# Patient Record
Sex: Female | Born: 1940
Health system: Southern US, Community
[De-identification: ages and names within clinical notes are randomized; demographics above are authoritative.]

## PROBLEM LIST (undated history)

## (undated) DIAGNOSIS — N189 Chronic kidney disease, unspecified: Secondary | ICD-10-CM

## (undated) DIAGNOSIS — R51 Headache: Secondary | ICD-10-CM

## (undated) DIAGNOSIS — T4145XA Adverse effect of unspecified anesthetic, initial encounter: Secondary | ICD-10-CM

## (undated) DIAGNOSIS — I10 Essential (primary) hypertension: Secondary | ICD-10-CM

## (undated) DIAGNOSIS — O24419 Gestational diabetes mellitus in pregnancy, unspecified control: Secondary | ICD-10-CM

## (undated) DIAGNOSIS — G4730 Sleep apnea, unspecified: Secondary | ICD-10-CM

## (undated) DIAGNOSIS — E785 Hyperlipidemia, unspecified: Secondary | ICD-10-CM

## (undated) DIAGNOSIS — F32A Depression, unspecified: Secondary | ICD-10-CM

## (undated) DIAGNOSIS — F419 Anxiety disorder, unspecified: Secondary | ICD-10-CM

## (undated) DIAGNOSIS — M1990 Unspecified osteoarthritis, unspecified site: Secondary | ICD-10-CM

## (undated) DIAGNOSIS — C189 Malignant neoplasm of colon, unspecified: Secondary | ICD-10-CM

## (undated) DIAGNOSIS — K759 Inflammatory liver disease, unspecified: Secondary | ICD-10-CM

## (undated) DIAGNOSIS — R519 Headache, unspecified: Secondary | ICD-10-CM

## (undated) DIAGNOSIS — T8859XA Other complications of anesthesia, initial encounter: Secondary | ICD-10-CM

## (undated) DIAGNOSIS — L509 Urticaria, unspecified: Secondary | ICD-10-CM

## (undated) DIAGNOSIS — E039 Hypothyroidism, unspecified: Secondary | ICD-10-CM

## (undated) DIAGNOSIS — K219 Gastro-esophageal reflux disease without esophagitis: Secondary | ICD-10-CM

## (undated) HISTORY — PX: CARPAL TUNNEL RELEASE: SHX101

## (undated) HISTORY — PX: ROTATOR CUFF REPAIR: SHX139

## (undated) HISTORY — DX: Hyperlipidemia, unspecified: E78.5

## (undated) HISTORY — DX: Urticaria, unspecified: L50.9

## (undated) HISTORY — PX: EYE SURGERY: SHX253

## (undated) HISTORY — PX: DILATION AND CURETTAGE OF UTERUS: SHX78

## (undated) HISTORY — PX: ABDOMINAL HYSTERECTOMY: SHX81

## (undated) HISTORY — DX: Sleep apnea, unspecified: G47.30

## (undated) HISTORY — PX: COLON SURGERY: SHX602

## (undated) HISTORY — PX: APPENDECTOMY: SHX54

---

## 1988-09-25 HISTORY — PX: ROTATOR CUFF REPAIR: SHX139

## 2000-03-13 ENCOUNTER — Encounter: Payer: Self-pay | Admitting: Internal Medicine

## 2000-03-13 ENCOUNTER — Encounter: Admission: RE | Admit: 2000-03-13 | Discharge: 2000-03-13 | Payer: Self-pay | Admitting: Internal Medicine

## 2000-04-25 ENCOUNTER — Encounter: Payer: Self-pay | Admitting: Internal Medicine

## 2000-04-25 ENCOUNTER — Encounter: Admission: RE | Admit: 2000-04-25 | Discharge: 2000-04-25 | Payer: Self-pay | Admitting: Internal Medicine

## 2001-03-15 ENCOUNTER — Encounter: Admission: RE | Admit: 2001-03-15 | Discharge: 2001-03-15 | Payer: Self-pay | Admitting: Internal Medicine

## 2001-03-15 ENCOUNTER — Encounter: Payer: Self-pay | Admitting: Internal Medicine

## 2002-03-18 ENCOUNTER — Encounter: Admission: RE | Admit: 2002-03-18 | Discharge: 2002-03-18 | Payer: Self-pay | Admitting: Internal Medicine

## 2002-03-18 ENCOUNTER — Encounter: Payer: Self-pay | Admitting: Internal Medicine

## 2003-03-20 ENCOUNTER — Encounter: Payer: Self-pay | Admitting: Internal Medicine

## 2003-03-20 ENCOUNTER — Encounter: Admission: RE | Admit: 2003-03-20 | Discharge: 2003-03-20 | Payer: Self-pay | Admitting: Internal Medicine

## 2003-12-31 ENCOUNTER — Encounter (INDEPENDENT_AMBULATORY_CARE_PROVIDER_SITE_OTHER): Payer: Self-pay | Admitting: Specialist

## 2003-12-31 ENCOUNTER — Ambulatory Visit (HOSPITAL_COMMUNITY): Admission: RE | Admit: 2003-12-31 | Discharge: 2003-12-31 | Payer: Self-pay | Admitting: Gastroenterology

## 2004-03-21 ENCOUNTER — Encounter: Admission: RE | Admit: 2004-03-21 | Discharge: 2004-03-21 | Payer: Self-pay | Admitting: Internal Medicine

## 2005-01-12 ENCOUNTER — Ambulatory Visit: Payer: Self-pay | Admitting: Internal Medicine

## 2005-01-12 ENCOUNTER — Ambulatory Visit (HOSPITAL_BASED_OUTPATIENT_CLINIC_OR_DEPARTMENT_OTHER): Admission: RE | Admit: 2005-01-12 | Discharge: 2005-01-12 | Payer: Self-pay | Admitting: Internal Medicine

## 2005-03-22 ENCOUNTER — Encounter: Admission: RE | Admit: 2005-03-22 | Discharge: 2005-03-22 | Payer: Self-pay | Admitting: Internal Medicine

## 2006-03-30 ENCOUNTER — Encounter: Admission: RE | Admit: 2006-03-30 | Discharge: 2006-03-30 | Payer: Self-pay | Admitting: Internal Medicine

## 2007-04-03 ENCOUNTER — Encounter: Admission: RE | Admit: 2007-04-03 | Discharge: 2007-04-03 | Payer: Self-pay | Admitting: Internal Medicine

## 2007-05-17 ENCOUNTER — Encounter: Admission: RE | Admit: 2007-05-17 | Discharge: 2007-05-17 | Payer: Self-pay | Admitting: General Surgery

## 2007-05-22 ENCOUNTER — Encounter (INDEPENDENT_AMBULATORY_CARE_PROVIDER_SITE_OTHER): Payer: Self-pay | Admitting: General Surgery

## 2007-05-22 ENCOUNTER — Inpatient Hospital Stay (HOSPITAL_COMMUNITY): Admission: RE | Admit: 2007-05-22 | Discharge: 2007-05-28 | Payer: Self-pay | Admitting: General Surgery

## 2007-05-29 ENCOUNTER — Ambulatory Visit: Payer: Self-pay | Admitting: Hematology & Oncology

## 2007-06-20 LAB — CBC WITH DIFFERENTIAL/PLATELET
BASO%: 0.6 % (ref 0.0–2.0)
Basophils Absolute: 0 10*3/uL (ref 0.0–0.1)
HCT: 33.1 % — ABNORMAL LOW (ref 34.8–46.6)
LYMPH%: 36.1 % (ref 14.0–48.0)
MCH: 29.6 pg (ref 26.0–34.0)
MCHC: 34.8 g/dL (ref 32.0–36.0)
MONO#: 0.6 10*3/uL (ref 0.1–0.9)
NEUT%: 51.7 % (ref 39.6–76.8)
Platelets: 215 10*3/uL (ref 145–400)
WBC: 6.4 10*3/uL (ref 3.9–10.0)

## 2007-06-20 LAB — COMPREHENSIVE METABOLIC PANEL
ALT: 17 U/L (ref 0–35)
BUN: 14 mg/dL (ref 6–23)
CO2: 28 mEq/L (ref 19–32)
Creatinine, Ser: 0.91 mg/dL (ref 0.40–1.20)
Total Bilirubin: 0.7 mg/dL (ref 0.3–1.2)

## 2007-06-20 LAB — CEA: CEA: 0.8 ng/mL (ref 0.0–5.0)

## 2007-06-29 ENCOUNTER — Encounter: Admission: RE | Admit: 2007-06-29 | Discharge: 2007-06-29 | Payer: Self-pay | Admitting: General Surgery

## 2007-07-18 ENCOUNTER — Ambulatory Visit (HOSPITAL_COMMUNITY): Admission: RE | Admit: 2007-07-18 | Discharge: 2007-07-18 | Payer: Self-pay | Admitting: Hematology & Oncology

## 2007-07-23 ENCOUNTER — Ambulatory Visit: Payer: Self-pay | Admitting: Hematology & Oncology

## 2007-07-25 LAB — CBC WITH DIFFERENTIAL/PLATELET
Basophils Absolute: 0 10*3/uL (ref 0.0–0.1)
Eosinophils Absolute: 0.3 10*3/uL (ref 0.0–0.5)
HCT: 32 % — ABNORMAL LOW (ref 34.8–46.6)
HGB: 11.4 g/dL — ABNORMAL LOW (ref 11.6–15.9)
MCH: 29.9 pg (ref 26.0–34.0)
MONO#: 0.4 10*3/uL (ref 0.1–0.9)
NEUT#: 4 10*3/uL (ref 1.5–6.5)
NEUT%: 59.5 % (ref 39.6–76.8)
RDW: 13.3 % (ref 11.3–14.5)
WBC: 6.6 10*3/uL (ref 3.9–10.0)
lymph#: 2 10*3/uL (ref 0.9–3.3)

## 2007-07-25 LAB — COMPREHENSIVE METABOLIC PANEL
Albumin: 4.4 g/dL (ref 3.5–5.2)
BUN: 16 mg/dL (ref 6–23)
CO2: 25 mEq/L (ref 19–32)
Calcium: 9.8 mg/dL (ref 8.4–10.5)
Chloride: 104 mEq/L (ref 96–112)
Creatinine, Ser: 0.87 mg/dL (ref 0.40–1.20)
Glucose, Bld: 144 mg/dL — ABNORMAL HIGH (ref 70–99)
Potassium: 3.9 mEq/L (ref 3.5–5.3)

## 2007-09-26 HISTORY — PX: BACK SURGERY: SHX140

## 2007-10-11 ENCOUNTER — Encounter: Admission: RE | Admit: 2007-10-11 | Discharge: 2007-10-11 | Payer: Self-pay | Admitting: Internal Medicine

## 2007-10-15 ENCOUNTER — Ambulatory Visit: Payer: Self-pay | Admitting: Hematology & Oncology

## 2007-10-17 LAB — CBC WITH DIFFERENTIAL/PLATELET
BASO%: 0.5 % (ref 0.0–2.0)
EOS%: 3.2 % (ref 0.0–7.0)
Eosinophils Absolute: 0.2 10*3/uL (ref 0.0–0.5)
LYMPH%: 39.7 % (ref 14.0–48.0)
MCH: 29.2 pg (ref 26.0–34.0)
MCHC: 34.8 g/dL (ref 32.0–36.0)
MCV: 84 fL (ref 81.0–101.0)
MONO%: 8.2 % (ref 0.0–13.0)
Platelets: 227 10*3/uL (ref 145–400)
RBC: 3.96 10*6/uL (ref 3.70–5.32)

## 2007-10-17 LAB — COMPREHENSIVE METABOLIC PANEL
AST: 8 U/L (ref 0–37)
Alkaline Phosphatase: 56 U/L (ref 39–117)
Glucose, Bld: 79 mg/dL (ref 70–99)
Sodium: 142 mEq/L (ref 135–145)
Total Bilirubin: 0.6 mg/dL (ref 0.3–1.2)
Total Protein: 7 g/dL (ref 6.0–8.3)

## 2007-10-21 ENCOUNTER — Ambulatory Visit (HOSPITAL_COMMUNITY): Admission: RE | Admit: 2007-10-21 | Discharge: 2007-10-21 | Payer: Self-pay | Admitting: Hematology & Oncology

## 2007-11-07 ENCOUNTER — Encounter (INDEPENDENT_AMBULATORY_CARE_PROVIDER_SITE_OTHER): Payer: Self-pay | Admitting: Neurological Surgery

## 2007-11-07 ENCOUNTER — Inpatient Hospital Stay (HOSPITAL_COMMUNITY): Admission: RE | Admit: 2007-11-07 | Discharge: 2007-11-11 | Payer: Self-pay | Admitting: Neurological Surgery

## 2007-12-03 ENCOUNTER — Encounter: Admission: RE | Admit: 2007-12-03 | Discharge: 2007-12-03 | Payer: Self-pay | Admitting: Neurological Surgery

## 2008-02-04 ENCOUNTER — Encounter: Admission: RE | Admit: 2008-02-04 | Discharge: 2008-02-04 | Payer: Self-pay | Admitting: Neurological Surgery

## 2008-02-12 ENCOUNTER — Ambulatory Visit: Payer: Self-pay | Admitting: Hematology & Oncology

## 2008-02-14 ENCOUNTER — Ambulatory Visit (HOSPITAL_COMMUNITY): Admission: RE | Admit: 2008-02-14 | Discharge: 2008-02-14 | Payer: Self-pay | Admitting: Hematology & Oncology

## 2008-03-09 ENCOUNTER — Encounter: Admission: RE | Admit: 2008-03-09 | Discharge: 2008-03-09 | Payer: Self-pay | Admitting: Neurological Surgery

## 2008-03-31 ENCOUNTER — Encounter: Admission: RE | Admit: 2008-03-31 | Discharge: 2008-03-31 | Payer: Self-pay | Admitting: Neurological Surgery

## 2008-04-24 ENCOUNTER — Encounter: Admission: RE | Admit: 2008-04-24 | Discharge: 2008-04-24 | Payer: Self-pay | Admitting: Internal Medicine

## 2008-05-29 ENCOUNTER — Ambulatory Visit: Payer: Self-pay | Admitting: Hematology & Oncology

## 2008-06-10 ENCOUNTER — Ambulatory Visit: Payer: Self-pay | Admitting: Hematology & Oncology

## 2008-06-10 ENCOUNTER — Ambulatory Visit (HOSPITAL_COMMUNITY): Admission: RE | Admit: 2008-06-10 | Discharge: 2008-06-10 | Payer: Self-pay | Admitting: Hematology & Oncology

## 2008-06-10 LAB — CBC WITH DIFFERENTIAL/PLATELET
Basophils Absolute: 0 10*3/uL (ref 0.0–0.1)
EOS%: 4.1 % (ref 0.0–7.0)
HCT: 32.4 % — ABNORMAL LOW (ref 34.8–46.6)
HGB: 11 g/dL — ABNORMAL LOW (ref 11.6–15.9)
MCH: 28.6 pg (ref 26.0–34.0)
MCV: 83.9 fL (ref 81.0–101.0)
MONO%: 9.6 % (ref 0.0–13.0)
NEUT%: 55.7 % (ref 39.6–76.8)

## 2008-06-10 LAB — COMPREHENSIVE METABOLIC PANEL
AST: 30 U/L (ref 0–37)
Alkaline Phosphatase: 66 U/L (ref 39–117)
BUN: 11 mg/dL (ref 6–23)
Calcium: 9.4 mg/dL (ref 8.4–10.5)
Creatinine, Ser: 0.89 mg/dL (ref 0.40–1.20)

## 2008-06-29 ENCOUNTER — Encounter: Admission: RE | Admit: 2008-06-29 | Discharge: 2008-06-29 | Payer: Self-pay | Admitting: Neurological Surgery

## 2008-10-12 ENCOUNTER — Ambulatory Visit: Payer: Self-pay | Admitting: Hematology & Oncology

## 2008-10-13 ENCOUNTER — Encounter (HOSPITAL_COMMUNITY): Admission: RE | Admit: 2008-10-13 | Discharge: 2009-01-11 | Payer: Self-pay | Admitting: Hematology & Oncology

## 2008-10-16 ENCOUNTER — Ambulatory Visit (HOSPITAL_COMMUNITY): Admission: RE | Admit: 2008-10-16 | Discharge: 2008-10-16 | Payer: Self-pay | Admitting: Hematology & Oncology

## 2008-11-12 ENCOUNTER — Inpatient Hospital Stay (HOSPITAL_COMMUNITY): Admission: RE | Admit: 2008-11-12 | Discharge: 2008-11-18 | Payer: Self-pay | Admitting: General Surgery

## 2008-11-12 ENCOUNTER — Encounter (INDEPENDENT_AMBULATORY_CARE_PROVIDER_SITE_OTHER): Payer: Self-pay | Admitting: General Surgery

## 2008-11-25 ENCOUNTER — Inpatient Hospital Stay (HOSPITAL_COMMUNITY): Admission: EM | Admit: 2008-11-25 | Discharge: 2008-11-29 | Payer: Self-pay | Admitting: Emergency Medicine

## 2008-12-16 ENCOUNTER — Ambulatory Visit: Payer: Self-pay | Admitting: Hematology & Oncology

## 2008-12-17 LAB — CBC WITH DIFFERENTIAL (CANCER CENTER ONLY)
BASO#: 0 10*3/uL (ref 0.0–0.2)
BASO%: 0.4 % (ref 0.0–2.0)
EOS%: 3.2 % (ref 0.0–7.0)
HGB: 11.6 g/dL (ref 11.6–15.9)
MCH: 28.8 pg (ref 26.0–34.0)
MCHC: 33.5 g/dL (ref 32.0–36.0)
MONO%: 8.5 % (ref 0.0–13.0)
NEUT#: 2.4 10*3/uL (ref 1.5–6.5)
NEUT%: 54 % (ref 39.6–80.0)
RDW: 16.5 % — ABNORMAL HIGH (ref 10.5–14.6)

## 2008-12-19 LAB — COMPREHENSIVE METABOLIC PANEL
ALT: 27 U/L (ref 0–35)
CO2: 24 mEq/L (ref 19–32)
Calcium: 9.3 mg/dL (ref 8.4–10.5)
Chloride: 104 mEq/L (ref 96–112)
Creatinine, Ser: 0.98 mg/dL (ref 0.40–1.20)
Glucose, Bld: 111 mg/dL — ABNORMAL HIGH (ref 70–99)

## 2008-12-19 LAB — RETICULOCYTES (CHCC): ABS Retic: 48.7 10*3/uL (ref 19.0–186.0)

## 2009-01-04 ENCOUNTER — Ambulatory Visit (HOSPITAL_COMMUNITY): Admission: RE | Admit: 2009-01-04 | Discharge: 2009-01-04 | Payer: Self-pay | Admitting: General Surgery

## 2009-01-20 LAB — CBC WITH DIFFERENTIAL (CANCER CENTER ONLY)
BASO#: 0 10*3/uL (ref 0.0–0.2)
EOS%: 5.2 % (ref 0.0–7.0)
HCT: 33.9 % — ABNORMAL LOW (ref 34.8–46.6)
HGB: 11.6 g/dL (ref 11.6–15.9)
LYMPH#: 1.4 10*3/uL (ref 0.9–3.3)
LYMPH%: 40.4 % (ref 14.0–48.0)
MCH: 30.1 pg (ref 26.0–34.0)
MCHC: 34.1 g/dL (ref 32.0–36.0)
MCV: 88 fL (ref 81–101)
NEUT%: 44 % (ref 39.6–80.0)

## 2009-01-20 LAB — COMPREHENSIVE METABOLIC PANEL
ALT: 25 U/L (ref 0–35)
AST: 19 U/L (ref 0–37)
Albumin: 4.1 g/dL (ref 3.5–5.2)
Alkaline Phosphatase: 52 U/L (ref 39–117)
Potassium: 3.8 mEq/L (ref 3.5–5.3)
Sodium: 142 mEq/L (ref 135–145)
Total Protein: 6 g/dL (ref 6.0–8.3)

## 2009-01-20 LAB — CEA: CEA: 1.2 ng/mL (ref 0.0–5.0)

## 2009-02-02 ENCOUNTER — Ambulatory Visit: Payer: Self-pay | Admitting: Hematology & Oncology

## 2009-02-03 LAB — CBC WITH DIFFERENTIAL (CANCER CENTER ONLY)
BASO#: 0 10*3/uL (ref 0.0–0.2)
BASO%: 0.4 % (ref 0.0–2.0)
Eosinophils Absolute: 0.2 10*3/uL (ref 0.0–0.5)
HCT: 34.4 % — ABNORMAL LOW (ref 34.8–46.6)
HGB: 11.8 g/dL (ref 11.6–15.9)
LYMPH#: 1.4 10*3/uL (ref 0.9–3.3)
LYMPH%: 44.1 % (ref 14.0–48.0)
MCV: 89 fL (ref 81–101)
MONO#: 0.3 10*3/uL (ref 0.1–0.9)
NEUT%: 41.1 % (ref 39.6–80.0)
RBC: 3.87 10*6/uL (ref 3.70–5.32)
WBC: 3.2 10*3/uL — ABNORMAL LOW (ref 3.9–10.0)

## 2009-02-03 LAB — COMPREHENSIVE METABOLIC PANEL
AST: 21 U/L (ref 0–37)
Albumin: 4.3 g/dL (ref 3.5–5.2)
Alkaline Phosphatase: 55 U/L (ref 39–117)
BUN: 15 mg/dL (ref 6–23)
Calcium: 9.7 mg/dL (ref 8.4–10.5)
Chloride: 106 mEq/L (ref 96–112)
Glucose, Bld: 166 mg/dL — ABNORMAL HIGH (ref 70–99)
Potassium: 3.6 mEq/L (ref 3.5–5.3)
Sodium: 140 mEq/L (ref 135–145)
Total Protein: 6.4 g/dL (ref 6.0–8.3)

## 2009-02-17 LAB — CBC WITH DIFFERENTIAL (CANCER CENTER ONLY)
BASO#: 0 10*3/uL (ref 0.0–0.2)
BASO%: 0.8 % (ref 0.0–2.0)
EOS%: 5.7 % (ref 0.0–7.0)
Eosinophils Absolute: 0.2 10*3/uL (ref 0.0–0.5)
HCT: 31.8 % — ABNORMAL LOW (ref 34.8–46.6)
HGB: 11.1 g/dL — ABNORMAL LOW (ref 11.6–15.9)
LYMPH#: 1.4 10*3/uL (ref 0.9–3.3)
LYMPH%: 39.5 % (ref 14.0–48.0)
MCH: 31.5 pg (ref 26.0–34.0)
MCHC: 35 g/dL (ref 32.0–36.0)
MCV: 90 fL (ref 81–101)
MONO#: 0.4 10*3/uL (ref 0.1–0.9)
MONO%: 10.6 % (ref 0.0–13.0)
NEUT#: 1.5 10*3/uL (ref 1.5–6.5)
NEUT%: 43.4 % (ref 39.6–80.0)
Platelets: 89 10*3/uL — ABNORMAL LOW (ref 145–400)
RBC: 3.54 10*6/uL — ABNORMAL LOW (ref 3.70–5.32)
RDW: 13.9 % (ref 10.5–14.6)
WBC: 3.4 10*3/uL — ABNORMAL LOW (ref 3.9–10.0)

## 2009-02-17 LAB — COMPREHENSIVE METABOLIC PANEL
AST: 31 U/L (ref 0–37)
Alkaline Phosphatase: 56 U/L (ref 39–117)
BUN: 17 mg/dL (ref 6–23)
Glucose, Bld: 133 mg/dL — ABNORMAL HIGH (ref 70–99)
Potassium: 3.2 mEq/L — ABNORMAL LOW (ref 3.5–5.3)
Total Bilirubin: 0.8 mg/dL (ref 0.3–1.2)

## 2009-02-17 LAB — CEA: CEA: 1.1 ng/mL (ref 0.0–5.0)

## 2009-03-03 LAB — COMPREHENSIVE METABOLIC PANEL
ALT: 29 U/L (ref 0–35)
AST: 25 U/L (ref 0–37)
BUN: 13 mg/dL (ref 6–23)
Calcium: 9.7 mg/dL (ref 8.4–10.5)
Chloride: 108 mEq/L (ref 96–112)
Creatinine, Ser: 0.92 mg/dL (ref 0.40–1.20)
Total Bilirubin: 0.7 mg/dL (ref 0.3–1.2)

## 2009-03-03 LAB — CEA: CEA: 1.1 ng/mL (ref 0.0–5.0)

## 2009-03-03 LAB — CBC WITH DIFFERENTIAL (CANCER CENTER ONLY)
EOS%: 6.5 % (ref 0.0–7.0)
LYMPH%: 44.6 % (ref 14.0–48.0)
MCH: 30.9 pg (ref 26.0–34.0)
MCHC: 33.6 g/dL (ref 32.0–36.0)
MCV: 92 fL (ref 81–101)
MONO%: 8.9 % (ref 0.0–13.0)
NEUT#: 1.2 10*3/uL — ABNORMAL LOW (ref 1.5–6.5)
Platelets: 85 10*3/uL — ABNORMAL LOW (ref 145–400)
RDW: 14.9 % — ABNORMAL HIGH (ref 10.5–14.6)

## 2009-03-17 LAB — CBC WITH DIFFERENTIAL (CANCER CENTER ONLY)
BASO%: 1.1 % (ref 0.0–2.0)
Eosinophils Absolute: 0.2 10*3/uL (ref 0.0–0.5)
LYMPH%: 42.5 % (ref 14.0–48.0)
MCV: 93 fL (ref 81–101)
MONO#: 0.6 10*3/uL (ref 0.1–0.9)
MONO%: 13.9 % — ABNORMAL HIGH (ref 0.0–13.0)
NEUT#: 1.5 10*3/uL (ref 1.5–6.5)
Platelets: 96 10*3/uL — ABNORMAL LOW (ref 145–400)
RBC: 3.75 10*6/uL (ref 3.70–5.32)
RDW: 15.3 % — ABNORMAL HIGH (ref 10.5–14.6)
WBC: 4 10*3/uL (ref 3.9–10.0)

## 2009-03-23 ENCOUNTER — Ambulatory Visit: Payer: Self-pay | Admitting: Hematology & Oncology

## 2009-03-31 LAB — CBC WITH DIFFERENTIAL (CANCER CENTER ONLY)
BASO#: 0 10*3/uL (ref 0.0–0.2)
BASO%: 0.3 % (ref 0.0–2.0)
EOS%: 4.6 % (ref 0.0–7.0)
HGB: 11.1 g/dL — ABNORMAL LOW (ref 11.6–15.9)
MCH: 32 pg (ref 26.0–34.0)
MCHC: 34 g/dL (ref 32.0–36.0)
MONO%: 11 % (ref 0.0–13.0)
NEUT#: 1.2 10*3/uL — ABNORMAL LOW (ref 1.5–6.5)
Platelets: 84 10*3/uL — ABNORMAL LOW (ref 145–400)
RDW: 15 % — ABNORMAL HIGH (ref 10.5–14.6)

## 2009-03-31 LAB — COMPREHENSIVE METABOLIC PANEL
ALT: 34 U/L (ref 0–35)
AST: 27 U/L (ref 0–37)
Alkaline Phosphatase: 57 U/L (ref 39–117)
BUN: 11 mg/dL (ref 6–23)
Calcium: 9.5 mg/dL (ref 8.4–10.5)
Chloride: 106 mEq/L (ref 96–112)
Creatinine, Ser: 0.94 mg/dL (ref 0.40–1.20)
Total Bilirubin: 0.5 mg/dL (ref 0.3–1.2)

## 2009-04-14 LAB — CBC WITH DIFFERENTIAL (CANCER CENTER ONLY)
EOS%: 5.6 % (ref 0.0–7.0)
Eosinophils Absolute: 0.2 10*3/uL (ref 0.0–0.5)
LYMPH#: 1.4 10*3/uL (ref 0.9–3.3)
MCH: 32.2 pg (ref 26.0–34.0)
MONO%: 9.6 % (ref 0.0–13.0)
NEUT#: 1.6 10*3/uL (ref 1.5–6.5)
Platelets: 86 10*3/uL — ABNORMAL LOW (ref 145–400)
RBC: 3.62 10*6/uL — ABNORMAL LOW (ref 3.70–5.32)

## 2009-04-14 LAB — COMPREHENSIVE METABOLIC PANEL
ALT: 39 U/L — ABNORMAL HIGH (ref 0–35)
CO2: 20 mEq/L (ref 19–32)
Calcium: 9.7 mg/dL (ref 8.4–10.5)
Chloride: 105 mEq/L (ref 96–112)
Creatinine, Ser: 0.97 mg/dL (ref 0.40–1.20)
Glucose, Bld: 142 mg/dL — ABNORMAL HIGH (ref 70–99)
Total Bilirubin: 0.6 mg/dL (ref 0.3–1.2)

## 2009-04-14 LAB — CEA: CEA: 1.3 ng/mL (ref 0.0–5.0)

## 2009-04-20 ENCOUNTER — Ambulatory Visit (HOSPITAL_COMMUNITY): Admission: RE | Admit: 2009-04-20 | Discharge: 2009-04-20 | Payer: Self-pay | Admitting: Hematology & Oncology

## 2009-04-27 ENCOUNTER — Ambulatory Visit: Payer: Self-pay | Admitting: Hematology & Oncology

## 2009-04-28 LAB — COMPREHENSIVE METABOLIC PANEL
Albumin: 4.1 g/dL (ref 3.5–5.2)
Alkaline Phosphatase: 56 U/L (ref 39–117)
BUN: 12 mg/dL (ref 6–23)
CO2: 24 mEq/L (ref 19–32)
Calcium: 9.5 mg/dL (ref 8.4–10.5)
Glucose, Bld: 138 mg/dL — ABNORMAL HIGH (ref 70–99)
Potassium: 3.4 mEq/L — ABNORMAL LOW (ref 3.5–5.3)
Sodium: 142 mEq/L (ref 135–145)
Total Protein: 6.4 g/dL (ref 6.0–8.3)

## 2009-04-28 LAB — CBC WITH DIFFERENTIAL (CANCER CENTER ONLY)
BASO#: 0 10*3/uL (ref 0.0–0.2)
BASO%: 0.5 % (ref 0.0–2.0)
EOS%: 5.9 % (ref 0.0–7.0)
HCT: 35.5 % (ref 34.8–46.6)
HGB: 12 g/dL (ref 11.6–15.9)
LYMPH%: 36 % (ref 14.0–48.0)
MCH: 32 pg (ref 26.0–34.0)
MCHC: 33.8 g/dL (ref 32.0–36.0)
MCV: 95 fL (ref 81–101)
MONO%: 8.5 % (ref 0.0–13.0)
NEUT%: 49.1 % (ref 39.6–80.0)
RDW: 13.8 % (ref 10.5–14.6)

## 2009-05-12 LAB — COMPREHENSIVE METABOLIC PANEL
ALT: 29 U/L (ref 0–35)
AST: 23 U/L (ref 0–37)
Albumin: 4.1 g/dL (ref 3.5–5.2)
Alkaline Phosphatase: 61 U/L (ref 39–117)
BUN: 9 mg/dL (ref 6–23)
Calcium: 9.4 mg/dL (ref 8.4–10.5)
Chloride: 107 mEq/L (ref 96–112)
Potassium: 3.2 mEq/L — ABNORMAL LOW (ref 3.5–5.3)
Sodium: 142 mEq/L (ref 135–145)
Total Protein: 6.2 g/dL (ref 6.0–8.3)

## 2009-05-12 LAB — CBC WITH DIFFERENTIAL (CANCER CENTER ONLY)
BASO#: 0.1 10*3/uL (ref 0.0–0.2)
HCT: 33.3 % — ABNORMAL LOW (ref 34.8–46.6)
HGB: 11.6 g/dL (ref 11.6–15.9)
LYMPH#: 1.4 10*3/uL (ref 0.9–3.3)
LYMPH%: 38.3 % (ref 14.0–48.0)
MCV: 95 fL (ref 81–101)
MONO#: 0.3 10*3/uL (ref 0.1–0.9)
NEUT%: 44.6 % (ref 39.6–80.0)
RDW: 13.5 % (ref 10.5–14.6)
WBC: 3.6 10*3/uL — ABNORMAL LOW (ref 3.9–10.0)

## 2009-05-26 LAB — COMPREHENSIVE METABOLIC PANEL
ALT: 33 U/L (ref 0–35)
AST: 28 U/L (ref 0–37)
Albumin: 4 g/dL (ref 3.5–5.2)
Alkaline Phosphatase: 55 U/L (ref 39–117)
Glucose, Bld: 161 mg/dL — ABNORMAL HIGH (ref 70–99)
Potassium: 3.4 mEq/L — ABNORMAL LOW (ref 3.5–5.3)
Sodium: 141 mEq/L (ref 135–145)
Total Bilirubin: 0.7 mg/dL (ref 0.3–1.2)
Total Protein: 6.2 g/dL (ref 6.0–8.3)

## 2009-05-26 LAB — CBC WITH DIFFERENTIAL (CANCER CENTER ONLY)
BASO%: 1.3 % (ref 0.0–2.0)
EOS%: 5.7 % (ref 0.0–7.0)
HCT: 35.3 % (ref 34.8–46.6)
LYMPH%: 35.4 % (ref 14.0–48.0)
MCHC: 34.6 g/dL (ref 32.0–36.0)
MCV: 95 fL (ref 81–101)
NEUT%: 47.6 % (ref 39.6–80.0)
RDW: 13.2 % (ref 10.5–14.6)

## 2009-05-27 ENCOUNTER — Ambulatory Visit: Payer: Self-pay | Admitting: Hematology & Oncology

## 2009-06-04 ENCOUNTER — Encounter: Admission: RE | Admit: 2009-06-04 | Discharge: 2009-06-04 | Payer: Self-pay | Admitting: Internal Medicine

## 2009-06-09 LAB — COMPREHENSIVE METABOLIC PANEL
Alkaline Phosphatase: 61 U/L (ref 39–117)
BUN: 8 mg/dL (ref 6–23)
CO2: 25 mEq/L (ref 19–32)
Creatinine, Ser: 0.83 mg/dL (ref 0.40–1.20)
Glucose, Bld: 134 mg/dL — ABNORMAL HIGH (ref 70–99)
Total Bilirubin: 0.6 mg/dL (ref 0.3–1.2)

## 2009-06-09 LAB — CBC WITH DIFFERENTIAL (CANCER CENTER ONLY)
BASO%: 0.4 % (ref 0.0–2.0)
Eosinophils Absolute: 0.2 10*3/uL (ref 0.0–0.5)
HCT: 33.2 % — ABNORMAL LOW (ref 34.8–46.6)
LYMPH#: 1.6 10*3/uL (ref 0.9–3.3)
LYMPH%: 48.7 % — ABNORMAL HIGH (ref 14.0–48.0)
MCV: 95 fL (ref 81–101)
MONO#: 0.3 10*3/uL (ref 0.1–0.9)
NEUT%: 35.4 % — ABNORMAL LOW (ref 39.6–80.0)
Platelets: 86 10*3/uL — ABNORMAL LOW (ref 145–400)
RBC: 3.49 10*6/uL — ABNORMAL LOW (ref 3.70–5.32)
RDW: 13.3 % (ref 10.5–14.6)
WBC: 3.2 10*3/uL — ABNORMAL LOW (ref 3.9–10.0)

## 2009-06-09 LAB — CEA: CEA: 1.3 ng/mL (ref 0.0–5.0)

## 2009-07-12 ENCOUNTER — Ambulatory Visit: Payer: Self-pay | Admitting: Diagnostic Radiology

## 2009-07-12 ENCOUNTER — Ambulatory Visit (HOSPITAL_BASED_OUTPATIENT_CLINIC_OR_DEPARTMENT_OTHER): Admission: RE | Admit: 2009-07-12 | Discharge: 2009-07-12 | Payer: Self-pay | Admitting: Hematology & Oncology

## 2009-07-13 ENCOUNTER — Ambulatory Visit: Payer: Self-pay | Admitting: Hematology & Oncology

## 2009-07-15 LAB — CBC WITH DIFFERENTIAL (CANCER CENTER ONLY)
BASO%: 0.5 % (ref 0.0–2.0)
Eosinophils Absolute: 0.4 10*3/uL (ref 0.0–0.5)
HCT: 34.6 % — ABNORMAL LOW (ref 34.8–46.6)
LYMPH%: 31.1 % (ref 14.0–48.0)
MCV: 94 fL (ref 81–101)
MONO#: 0.4 10*3/uL (ref 0.1–0.9)
NEUT%: 53.1 % (ref 39.6–80.0)
RDW: 12.7 % (ref 10.5–14.6)
WBC: 5 10*3/uL (ref 3.9–10.0)

## 2009-07-15 LAB — COMPREHENSIVE METABOLIC PANEL
Albumin: 4.2 g/dL (ref 3.5–5.2)
Alkaline Phosphatase: 59 U/L (ref 39–117)
BUN: 13 mg/dL (ref 6–23)
CO2: 25 mEq/L (ref 19–32)
Calcium: 9.4 mg/dL (ref 8.4–10.5)
Glucose, Bld: 117 mg/dL — ABNORMAL HIGH (ref 70–99)
Potassium: 3.5 mEq/L (ref 3.5–5.3)

## 2009-07-16 LAB — VITAMIN D 25 HYDROXY (VIT D DEFICIENCY, FRACTURES): Vit D, 25-Hydroxy: 27 ng/mL — ABNORMAL LOW (ref 30–89)

## 2009-08-25 ENCOUNTER — Ambulatory Visit: Payer: Self-pay | Admitting: Hematology & Oncology

## 2009-10-07 ENCOUNTER — Ambulatory Visit: Payer: Self-pay | Admitting: Diagnostic Radiology

## 2009-10-07 ENCOUNTER — Ambulatory Visit (HOSPITAL_BASED_OUTPATIENT_CLINIC_OR_DEPARTMENT_OTHER): Admission: RE | Admit: 2009-10-07 | Discharge: 2009-10-07 | Payer: Self-pay | Admitting: Hematology & Oncology

## 2009-10-12 ENCOUNTER — Ambulatory Visit: Payer: Self-pay | Admitting: Hematology & Oncology

## 2009-10-14 LAB — COMPREHENSIVE METABOLIC PANEL
BUN: 14 mg/dL (ref 6–23)
CO2: 25 mEq/L (ref 19–32)
Calcium: 9.7 mg/dL (ref 8.4–10.5)
Chloride: 106 mEq/L (ref 96–112)
Creatinine, Ser: 0.84 mg/dL (ref 0.40–1.20)
Glucose, Bld: 107 mg/dL — ABNORMAL HIGH (ref 70–99)

## 2009-10-14 LAB — CBC WITH DIFFERENTIAL (CANCER CENTER ONLY)
Eosinophils Absolute: 0.3 10*3/uL (ref 0.0–0.5)
HCT: 37.3 % (ref 34.8–46.6)
LYMPH%: 39.8 % (ref 14.0–48.0)
MCV: 89 fL (ref 81–101)
MONO#: 0.4 10*3/uL (ref 0.1–0.9)
NEUT%: 48 % (ref 39.6–80.0)
RBC: 4.18 10*6/uL (ref 3.70–5.32)
WBC: 5.5 10*3/uL (ref 3.9–10.0)

## 2009-10-14 LAB — FERRITIN: Ferritin: 250 ng/mL (ref 10–291)

## 2009-11-24 ENCOUNTER — Ambulatory Visit: Payer: Self-pay | Admitting: Hematology & Oncology

## 2010-01-11 ENCOUNTER — Ambulatory Visit: Payer: Self-pay | Admitting: Hematology & Oncology

## 2010-01-12 LAB — COMPREHENSIVE METABOLIC PANEL
Albumin: 4.3 g/dL (ref 3.5–5.2)
Alkaline Phosphatase: 55 U/L (ref 39–117)
BUN: 15 mg/dL (ref 6–23)
Calcium: 9.9 mg/dL (ref 8.4–10.5)
Creatinine, Ser: 0.91 mg/dL (ref 0.40–1.20)
Glucose, Bld: 97 mg/dL (ref 70–99)
Potassium: 3.3 mEq/L — ABNORMAL LOW (ref 3.5–5.3)

## 2010-01-12 LAB — CBC WITH DIFFERENTIAL (CANCER CENTER ONLY)
BASO#: 0 10*3/uL (ref 0.0–0.2)
Eosinophils Absolute: 0.3 10*3/uL (ref 0.0–0.5)
HCT: 38 % (ref 34.8–46.6)
HGB: 12.8 g/dL (ref 11.6–15.9)
LYMPH%: 40.9 % (ref 14.0–48.0)
MCH: 30.5 pg (ref 26.0–34.0)
MCHC: 33.6 g/dL (ref 32.0–36.0)
MCV: 91 fL (ref 81–101)
MONO%: 7.8 % (ref 0.0–13.0)
NEUT#: 2.2 10*3/uL (ref 1.5–6.5)
NEUT%: 44.2 % (ref 39.6–80.0)
RBC: 4.2 10*6/uL (ref 3.70–5.32)

## 2010-01-12 LAB — VITAMIN D 25 HYDROXY (VIT D DEFICIENCY, FRACTURES): Vit D, 25-Hydroxy: 38 ng/mL (ref 30–89)

## 2010-01-28 ENCOUNTER — Ambulatory Visit (HOSPITAL_BASED_OUTPATIENT_CLINIC_OR_DEPARTMENT_OTHER): Admission: RE | Admit: 2010-01-28 | Discharge: 2010-01-28 | Payer: Self-pay | Admitting: General Surgery

## 2010-03-01 ENCOUNTER — Emergency Department (HOSPITAL_COMMUNITY): Admission: EM | Admit: 2010-03-01 | Discharge: 2010-03-02 | Payer: Self-pay | Admitting: Emergency Medicine

## 2010-04-11 ENCOUNTER — Ambulatory Visit: Payer: Self-pay | Admitting: Hematology & Oncology

## 2010-04-13 LAB — CBC WITH DIFFERENTIAL (CANCER CENTER ONLY)
BASO#: 0 10*3/uL (ref 0.0–0.2)
Eosinophils Absolute: 0.3 10*3/uL (ref 0.0–0.5)
HCT: 38.8 % (ref 34.8–46.6)
HGB: 13.3 g/dL (ref 11.6–15.9)
LYMPH#: 2.1 10*3/uL (ref 0.9–3.3)
LYMPH%: 38.1 % (ref 14.0–48.0)
MCV: 91 fL (ref 81–101)
MONO#: 0.5 10*3/uL (ref 0.1–0.9)
NEUT%: 48.2 % (ref 39.6–80.0)
WBC: 5.5 10*3/uL (ref 3.9–10.0)

## 2010-06-06 ENCOUNTER — Encounter: Admission: RE | Admit: 2010-06-06 | Discharge: 2010-06-06 | Payer: Self-pay | Admitting: Hematology & Oncology

## 2010-07-13 ENCOUNTER — Ambulatory Visit: Payer: Self-pay | Admitting: Hematology & Oncology

## 2010-07-14 LAB — CBC WITH DIFFERENTIAL (CANCER CENTER ONLY)
BASO#: 0 10*3/uL (ref 0.0–0.2)
EOS%: 5.2 % (ref 0.0–7.0)
Eosinophils Absolute: 0.3 10*3/uL (ref 0.0–0.5)
HGB: 12.8 g/dL (ref 11.6–15.9)
LYMPH#: 2.2 10*3/uL (ref 0.9–3.3)
MCHC: 33.8 g/dL (ref 32.0–36.0)
NEUT#: 2.5 10*3/uL (ref 1.5–6.5)
RBC: 4.15 10*6/uL (ref 3.70–5.32)

## 2010-07-14 LAB — COMPREHENSIVE METABOLIC PANEL
Albumin: 4.5 g/dL (ref 3.5–5.2)
Alkaline Phosphatase: 52 U/L (ref 39–117)
Glucose, Bld: 97 mg/dL (ref 70–99)
Potassium: 3.8 mEq/L (ref 3.5–5.3)
Sodium: 141 mEq/L (ref 135–145)
Total Protein: 6.6 g/dL (ref 6.0–8.3)

## 2010-10-16 ENCOUNTER — Encounter: Payer: Self-pay | Admitting: Hematology & Oncology

## 2010-10-17 ENCOUNTER — Encounter: Payer: Self-pay | Admitting: Hematology & Oncology

## 2010-10-18 ENCOUNTER — Ambulatory Visit: Payer: Self-pay | Admitting: Hematology & Oncology

## 2010-10-19 LAB — CBC WITH DIFFERENTIAL (CANCER CENTER ONLY)
BASO#: 0 10*3/uL (ref 0.0–0.2)
BASO%: 0.4 % (ref 0.0–2.0)
EOS%: 4.6 % (ref 0.0–7.0)
HGB: 13 g/dL (ref 11.6–15.9)
MCH: 31 pg (ref 26.0–34.0)
MCHC: 34.8 g/dL (ref 32.0–36.0)
MONO%: 8.1 % (ref 0.0–13.0)
NEUT#: 2.9 10*3/uL (ref 1.5–6.5)
Platelets: 169 10*3/uL (ref 145–400)
RDW: 11.8 % (ref 10.5–14.6)

## 2010-10-20 LAB — COMPREHENSIVE METABOLIC PANEL
ALT: 53 U/L — ABNORMAL HIGH (ref 0–35)
AST: 41 U/L — ABNORMAL HIGH (ref 0–37)
Albumin: 4.6 g/dL (ref 3.5–5.2)
Alkaline Phosphatase: 52 U/L (ref 39–117)
BUN: 18 mg/dL (ref 6–23)
Potassium: 3.4 mEq/L — ABNORMAL LOW (ref 3.5–5.3)

## 2010-12-12 LAB — URINALYSIS, ROUTINE W REFLEX MICROSCOPIC
Ketones, ur: NEGATIVE mg/dL
Nitrite: NEGATIVE
Protein, ur: NEGATIVE mg/dL
pH: 6 (ref 5.0–8.0)

## 2010-12-12 LAB — DIFFERENTIAL
Basophils Relative: 0 % (ref 0–1)
Eosinophils Absolute: 0.3 10*3/uL (ref 0.0–0.7)
Lymphs Abs: 3.4 10*3/uL (ref 0.7–4.0)
Monocytes Relative: 7 % (ref 3–12)
Neutro Abs: 4.9 10*3/uL (ref 1.7–7.7)
Neutrophils Relative %: 53 % (ref 43–77)

## 2010-12-12 LAB — COMPREHENSIVE METABOLIC PANEL
Alkaline Phosphatase: 57 U/L (ref 39–117)
BUN: 13 mg/dL (ref 6–23)
CO2: 26 mEq/L (ref 19–32)
Calcium: 10 mg/dL (ref 8.4–10.5)
GFR calc non Af Amer: >60 mL/min (ref >60)
Glucose, Bld: 119 mg/dL — ABNORMAL HIGH (ref 70–99)
Total Protein: 7.1 g/dL (ref 6.0–8.3)

## 2010-12-12 LAB — CBC
Platelets: 185 10*3/uL (ref 150–400)
RDW: 13 % (ref 11.5–15.5)

## 2010-12-12 LAB — LIPASE, BLOOD: Lipase: 36 U/L (ref 11–59)

## 2011-01-04 LAB — HEMOGLOBIN AND HEMATOCRIT, BLOOD
HCT: 36.5 % (ref 36.0–46.0)
Hemoglobin: 12.6 g/dL (ref 12.0–15.0)

## 2011-01-04 LAB — BASIC METABOLIC PANEL
CO2: 28 mEq/L (ref 19–32)
Calcium: 9.6 mg/dL (ref 8.4–10.5)
Creatinine, Ser: 0.77 mg/dL (ref 0.4–1.2)
GFR calc Af Amer: >60 mL/min (ref >60)
GFR calc non Af Amer: >60 mL/min (ref >60)
Sodium: 141 mEq/L (ref 135–145)

## 2011-01-05 LAB — CBC
HCT: 31.5 % — ABNORMAL LOW (ref 36.0–46.0)
HCT: 37.7 % (ref 36.0–46.0)
Hemoglobin: 10.7 g/dL — ABNORMAL LOW (ref 12.0–15.0)
Hemoglobin: 10.8 g/dL — ABNORMAL LOW (ref 12.0–15.0)
MCHC: 32.9 g/dL (ref 30.0–36.0)
MCHC: 33.4 g/dL (ref 30.0–36.0)
MCHC: 33.8 g/dL (ref 30.0–36.0)
MCV: 84.8 fL (ref 78.0–100.0)
MCV: 84.9 fL (ref 78.0–100.0)
Platelets: 372 10*3/uL (ref 150–400)
Platelets: 447 10*3/uL — ABNORMAL HIGH (ref 150–400)
RBC: 3.72 MIL/uL — ABNORMAL LOW (ref 3.87–5.11)
RBC: 3.75 MIL/uL — ABNORMAL LOW (ref 3.87–5.11)
RBC: 3.79 MIL/uL — ABNORMAL LOW (ref 3.87–5.11)
RDW: 20.9 % — ABNORMAL HIGH (ref 11.5–15.5)
RDW: 21.6 % — ABNORMAL HIGH (ref 11.5–15.5)
WBC: 11.4 10*3/uL — ABNORMAL HIGH (ref 4.0–10.5)
WBC: 5.1 10*3/uL (ref 4.0–10.5)

## 2011-01-05 LAB — DIFFERENTIAL
Basophils Absolute: 0 10*3/uL (ref 0.0–0.1)
Basophils Relative: 0 % (ref 0–1)
Eosinophils Absolute: 0.2 10*3/uL (ref 0.0–0.7)
Eosinophils Relative: 1 % (ref 0–5)
Lymphs Abs: 1.5 10*3/uL (ref 0.7–4.0)
Monocytes Absolute: 0.7 10*3/uL (ref 0.1–1.0)
Monocytes Relative: 6 % (ref 3–12)
Monocytes Relative: 9 % (ref 3–12)
Neutro Abs: 3 10*3/uL (ref 1.7–7.7)
Neutro Abs: 9.6 10*3/uL — ABNORMAL HIGH (ref 1.7–7.7)
Neutrophils Relative %: 59 % (ref 43–77)

## 2011-01-05 LAB — POCT I-STAT, CHEM 8
BUN: 17 mg/dL (ref 6–23)
Chloride: 96 mEq/L (ref 96–112)
Glucose, Bld: 126 mg/dL — ABNORMAL HIGH (ref 70–99)
HCT: 38 % (ref 36.0–46.0)
Potassium: 3.4 mEq/L — ABNORMAL LOW (ref 3.5–5.1)

## 2011-01-05 LAB — URINE MICROSCOPIC-ADD ON

## 2011-01-05 LAB — URINALYSIS, ROUTINE W REFLEX MICROSCOPIC
Bilirubin Urine: NEGATIVE
Glucose, UA: NEGATIVE mg/dL
Nitrite: NEGATIVE
Specific Gravity, Urine: 1.021 (ref 1.005–1.030)
pH: 7 (ref 5.0–8.0)

## 2011-01-05 LAB — BASIC METABOLIC PANEL
BUN: 9 mg/dL (ref 6–23)
CO2: 27 mEq/L (ref 19–32)
CO2: 30 mEq/L (ref 19–32)
Calcium: 8.8 mg/dL (ref 8.4–10.5)
Chloride: 102 mEq/L (ref 96–112)
Creatinine, Ser: 0.97 mg/dL (ref 0.4–1.2)
GFR calc Af Amer: >60 mL/min (ref >60)
Glucose, Bld: 104 mg/dL — ABNORMAL HIGH (ref 70–99)
Sodium: 135 mEq/L (ref 135–145)
Sodium: 137 mEq/L (ref 135–145)

## 2011-01-05 LAB — COMPREHENSIVE METABOLIC PANEL
ALT: 29 U/L (ref 0–35)
AST: 20 U/L (ref 0–37)
Albumin: 3.5 g/dL (ref 3.5–5.2)
Alkaline Phosphatase: 72 U/L (ref 39–117)
BUN: 15 mg/dL (ref 6–23)
Chloride: 95 mEq/L — ABNORMAL LOW (ref 96–112)
GFR calc Af Amer: >60 mL/min (ref >60)
Potassium: 3.4 mEq/L — ABNORMAL LOW (ref 3.5–5.1)
Sodium: 135 mEq/L (ref 135–145)
Total Bilirubin: 1 mg/dL (ref 0.3–1.2)
Total Protein: 6.7 g/dL (ref 6.0–8.3)

## 2011-01-05 LAB — PROTIME-INR: INR: 1 (ref 0.00–1.49)

## 2011-01-09 LAB — GLUCOSE, CAPILLARY: Glucose-Capillary: 109 mg/dL — ABNORMAL HIGH (ref 70–99)

## 2011-01-09 LAB — CROSSMATCH: Antibody Screen: NEGATIVE

## 2011-01-09 LAB — ABO/RH: ABO/RH(D): O POS

## 2011-01-10 LAB — COMPREHENSIVE METABOLIC PANEL
BUN: 11 mg/dL (ref 6–23)
CO2: 29 mEq/L (ref 19–32)
Calcium: 9.6 mg/dL (ref 8.4–10.5)
Chloride: 104 mEq/L (ref 96–112)
Creatinine, Ser: 0.88 mg/dL (ref 0.4–1.2)
GFR calc non Af Amer: >60 mL/min (ref >60)
Total Bilirubin: 0.6 mg/dL (ref 0.3–1.2)

## 2011-01-10 LAB — DIFFERENTIAL
Basophils Absolute: 0.1 10*3/uL (ref 0.0–0.1)
Eosinophils Relative: 5 % (ref 0–5)
Lymphocytes Relative: 28 % (ref 12–46)
Monocytes Relative: 7 % (ref 3–12)

## 2011-01-10 LAB — CBC
HCT: 23.4 % — ABNORMAL LOW (ref 36.0–46.0)
HCT: 30.9 % — ABNORMAL LOW (ref 36.0–46.0)
Hemoglobin: 10.2 g/dL — ABNORMAL LOW (ref 12.0–15.0)
Hemoglobin: 7.8 g/dL — CL (ref 12.0–15.0)
MCHC: 33.1 g/dL (ref 30.0–36.0)
MCHC: 33.9 g/dL (ref 30.0–36.0)
MCV: 82.4 fL (ref 78.0–100.0)
MCV: 85.1 fL (ref 78.0–100.0)
RBC: 2.76 MIL/uL — ABNORMAL LOW (ref 3.87–5.11)
RBC: 3.09 MIL/uL — ABNORMAL LOW (ref 3.87–5.11)
RBC: 3.63 MIL/uL — ABNORMAL LOW (ref 3.87–5.11)
RBC: 3.97 MIL/uL (ref 3.87–5.11)
RDW: 22.8 % — ABNORMAL HIGH (ref 11.5–15.5)
RDW: 26.1 % — ABNORMAL HIGH (ref 11.5–15.5)
WBC: 11.7 10*3/uL — ABNORMAL HIGH (ref 4.0–10.5)
WBC: 5.4 10*3/uL (ref 4.0–10.5)

## 2011-01-10 LAB — BASIC METABOLIC PANEL
CO2: 21 mEq/L (ref 19–32)
CO2: 27 mEq/L (ref 19–32)
Calcium: 8.2 mg/dL — ABNORMAL LOW (ref 8.4–10.5)
Chloride: 107 mEq/L (ref 96–112)
Chloride: 109 mEq/L (ref 96–112)
Creatinine, Ser: 0.99 mg/dL (ref 0.4–1.2)
GFR calc Af Amer: >60 mL/min (ref >60)
GFR calc Af Amer: >60 mL/min (ref >60)
Glucose, Bld: 141 mg/dL — ABNORMAL HIGH (ref 70–99)
Glucose, Bld: 174 mg/dL — ABNORMAL HIGH (ref 70–99)
Sodium: 137 mEq/L (ref 135–145)

## 2011-01-10 LAB — PROTIME-INR
INR: 1 (ref 0.00–1.49)
Prothrombin Time: 12.9 seconds (ref 11.6–15.2)

## 2011-01-10 LAB — URINALYSIS, ROUTINE W REFLEX MICROSCOPIC
Bilirubin Urine: NEGATIVE
Glucose, UA: NEGATIVE mg/dL
Hgb urine dipstick: NEGATIVE
Ketones, ur: NEGATIVE mg/dL
Protein, ur: NEGATIVE mg/dL

## 2011-01-10 LAB — TYPE AND SCREEN

## 2011-01-10 LAB — APTT: aPTT: 28 seconds (ref 24–37)

## 2011-01-18 ENCOUNTER — Other Ambulatory Visit: Payer: Self-pay | Admitting: Family

## 2011-01-18 ENCOUNTER — Encounter (HOSPITAL_BASED_OUTPATIENT_CLINIC_OR_DEPARTMENT_OTHER): Payer: Medicare Other | Admitting: Hematology & Oncology

## 2011-01-18 ENCOUNTER — Other Ambulatory Visit: Payer: Self-pay | Admitting: Hematology & Oncology

## 2011-01-18 ENCOUNTER — Ambulatory Visit (HOSPITAL_BASED_OUTPATIENT_CLINIC_OR_DEPARTMENT_OTHER)
Admission: RE | Admit: 2011-01-18 | Discharge: 2011-01-18 | Disposition: A | Discharge status: Home / Self Care | Payer: Medicare Other | Source: Ambulatory Visit | Attending: Hematology & Oncology | Admitting: Hematology & Oncology

## 2011-01-18 DIAGNOSIS — C186 Malignant neoplasm of descending colon: Secondary | ICD-10-CM

## 2011-01-18 DIAGNOSIS — R52 Pain, unspecified: Secondary | ICD-10-CM

## 2011-01-18 DIAGNOSIS — C189 Malignant neoplasm of colon, unspecified: Secondary | ICD-10-CM

## 2011-01-18 DIAGNOSIS — M79609 Pain in unspecified limb: Secondary | ICD-10-CM | POA: Insufficient documentation

## 2011-01-18 DIAGNOSIS — R7989 Other specified abnormal findings of blood chemistry: Secondary | ICD-10-CM

## 2011-01-18 DIAGNOSIS — M19019 Primary osteoarthritis, unspecified shoulder: Secondary | ICD-10-CM | POA: Insufficient documentation

## 2011-01-18 LAB — CBC WITH DIFFERENTIAL (CANCER CENTER ONLY)
BASO#: 0 10*3/uL (ref 0.0–0.2)
BASO%: 0.3 % (ref 0.0–2.0)
EOS%: 4.3 % (ref 0.0–7.0)
Eosinophils Absolute: 0.3 10*3/uL (ref 0.0–0.5)
HCT: 36.4 % (ref 34.8–46.6)
HGB: 12.6 g/dL (ref 11.6–15.9)
LYMPH#: 2.4 10*3/uL (ref 0.9–3.3)
LYMPH%: 34 % (ref 14.0–48.0)
MCH: 30.4 pg (ref 26.0–34.0)
MCHC: 34.6 g/dL (ref 32.0–36.0)
MCV: 88 fL (ref 81–101)
MONO#: 0.7 10*3/uL (ref 0.1–0.9)
MONO%: 10.1 % (ref 0.0–13.0)
NEUT#: 3.6 10*3/uL (ref 1.5–6.5)
NEUT%: 51.3 % (ref 39.6–80.0)
Platelets: 167 10*3/uL (ref 145–400)
RBC: 4.15 10*6/uL (ref 3.70–5.32)
RDW: 12.7 % (ref 11.1–15.7)
WBC: 7 10*3/uL (ref 3.9–10.0)

## 2011-01-18 LAB — COMPREHENSIVE METABOLIC PANEL WITH GFR
ALT: 61 U/L — ABNORMAL HIGH (ref 0–35)
AST: 62 U/L — ABNORMAL HIGH (ref 0–37)
Albumin: 4.6 g/dL (ref 3.5–5.2)
Alkaline Phosphatase: 50 U/L (ref 39–117)
BUN: 18 mg/dL (ref 6–23)
CO2: 27 meq/L (ref 19–32)
Calcium: 9.7 mg/dL (ref 8.4–10.5)
Chloride: 101 meq/L (ref 96–112)
Creatinine, Ser: 1.05 mg/dL (ref 0.40–1.20)
Glucose, Bld: 105 mg/dL — ABNORMAL HIGH (ref 70–99)
Potassium: 3.5 meq/L (ref 3.5–5.3)
Sodium: 139 meq/L (ref 135–145)
Total Bilirubin: 0.7 mg/dL (ref 0.3–1.2)
Total Protein: 7.1 g/dL (ref 6.0–8.3)

## 2011-01-18 LAB — CEA: CEA: 1.7 ng/mL (ref 0.0–5.0)

## 2011-01-26 ENCOUNTER — Encounter (HOSPITAL_COMMUNITY): Payer: Self-pay

## 2011-01-26 ENCOUNTER — Ambulatory Visit (HOSPITAL_COMMUNITY)
Admission: RE | Admit: 2011-01-26 | Discharge: 2011-01-26 | Disposition: A | Discharge status: Home / Self Care | Payer: Medicare Other | Source: Ambulatory Visit | Attending: Hematology & Oncology | Admitting: Hematology & Oncology

## 2011-01-26 DIAGNOSIS — C189 Malignant neoplasm of colon, unspecified: Secondary | ICD-10-CM | POA: Insufficient documentation

## 2011-01-26 DIAGNOSIS — C186 Malignant neoplasm of descending colon: Secondary | ICD-10-CM

## 2011-01-26 DIAGNOSIS — Z90710 Acquired absence of both cervix and uterus: Secondary | ICD-10-CM | POA: Insufficient documentation

## 2011-01-26 DIAGNOSIS — J984 Other disorders of lung: Secondary | ICD-10-CM | POA: Insufficient documentation

## 2011-01-26 DIAGNOSIS — R7989 Other specified abnormal findings of blood chemistry: Secondary | ICD-10-CM | POA: Insufficient documentation

## 2011-01-26 HISTORY — DX: Essential (primary) hypertension: I10

## 2011-01-26 HISTORY — DX: Malignant neoplasm of colon, unspecified: C18.9

## 2011-01-26 MED ORDER — IOHEXOL 300 MG/ML  SOLN
100.0000 mL | Freq: Once | INTRAMUSCULAR | Status: AC | PRN
  Administered 2011-01-26: 100 mL via INTRAVENOUS

## 2011-02-07 NOTE — Op Note (Signed)
Robin [name], Robin [name]                ACCOUNT NO.:  [id number]   MEDICAL RECORD NO.:  [id number]          PATIENT TYPE:  AMB   LOCATION:  DAY                          FACILITY:  [facility]   PHYSICIAN:  Angelia Mould. Derrell Lolling, M.D.DATE OF BIRTH:  Nov 26, 1940   DATE OF PROCEDURE:  01/04/2009  DATE OF DISCHARGE:                               OPERATIVE REPORT   PREOPERATIVE DIAGNOSIS:  Recurrent colon cancer.   POSTOPERATIVE DIAGNOSIS:  Recurrent colon cancer.   OPERATION PERFORMED:  Insertion of X-Port venous vascular access device  with fluoroscopic guidance.   SURGEON:  Dr. Claud Kelp.   OPERATIVE INDICATIONS:  This is a 70 year old Caucasian female who was  diagnosed with carcinoma of the descending colon, stage T3, N0 and  underwent surgery with a left colectomy on May 22, 2007.  She had a  4.2-cm tumor.  19 lymph nodes were examined and all 19 were negative.  She had widely negative margins.  She presented in February of this year  with left-sided pain and anemia and a colonoscopy showed a recurrent  colon cancer in the descending colon at or near the anastomosis.  She  underwent resection of the left colon on November 12, 2008 and this was  found to be recurrent moderately differentiated adenocarcinoma with a  6.6 tumor in greatest dimension.  All margins were negative.  Nodes were  negative.  Final pathology was T3, N0.  Because of the recurrent nature  of her tumor.  She is going to received chemotherapy at this time with  Dr. Myna Hidalgo.  Dr. Myna Hidalgo has requested insertion of Port-A-Cath.  She  has been counseled regarding the indications and details and risks of  this procedure and is in full agreement with this plan.  She is brought  to the operating room electively.  Chemotherapy is planned 48 hours from  now.   OPERATIVE TECHNIQUE:  The patient was brought to the operating room.  She was placed supine on the operating table.  Her arms were placed at  her sides and a very tiny  roll was placed between her shoulders.  She  was monitored and sedated by anesthesia department.  Intravenous  antibiotics were given.  The neck and chest was prepped and draped in  sterile fashion.  The patient was identified as to correct patient and  correct procedure and surgical time-out was held.  0.5% Marcaine with  epinephrine was used as local infiltration anesthetic.   A right subclavian venipuncture was performed with a single pass and a  guidewire inserted into the superior vena cava under fluoroscopic  guidance without difficulty.  A small incision was made at the wire  insertion site.  About 3 cm below this in the lateral anterior chest  wall I made about 3 cm incision and created a subcutaneous pocket.  Using a tunneling device I drew the catheter between the wire insertion  site and the port pocket site.  Using fluoroscopy I marked on the skin  where I thought the tip of the catheter should be in the superior vena  cava just at and above  the right atrial junction.  I then measured the  catheter using a marking the skin and cut the catheter a total of 21 cm  in length.  The catheter was then secured to the port with a locking  device.  This was flushed with heparinized saline.  The port was sutured  to the pectoralis fascia with 3 interrupted sutures of 2-0 Prolene.  I  then flushed the port one more time.  With the patient back in  Trendelenburg position, I inserted the dilator and peel-away sheath over  the wire into the central venous circulation.  The dilator and wire were  removed.  The catheter was threaded through the peel-away sheath into  the central venous circulation and peel-away sheath removed.  I flushed  the catheter easily and had excellent blood return.  I then flushed the  catheter with concentrated heparin solution.  I then used the  fluoroscopy overhead which confirmed that the tip of the catheter was in  the superior vena cava just above the right  atrium and that there was no  deformity of the catheter anywhere along its length.  The subcutaneous  tissue was closed with 3-0 Vicryl sutures and the skin incisions were  closed with subcuticular sutures of 4-0 Monocryl and Steri-Strips.  Clean bandages were placed and the patient taken to recovery in stable  condition.  Estimated blood loss was about 10 mL.  Complications were  none. Sponge, needle and instrument counts were correct.      Angelia Mould. Derrell Lolling, M.D.  Electronically Signed     HMI/MEDQ  D:  01/04/2009  T:  01/04/2009  Job:  [id number]   cc:   Rose Phi. Myna Hidalgo, M.D.  Fax: [phone]   Theressa Millard, M.D.  Fax: [phone]

## 2011-02-07 NOTE — Op Note (Signed)
Robin [name], Robin [name] NO.:  [id number]   MEDICAL RECORD NO.:  [id number]          PATIENT TYPE:  INP   LOCATION:  3018                         FACILITY:  [facility]   PHYSICIAN:  Tia Alert, MD     DATE OF BIRTH:  15-May-1941   DATE OF PROCEDURE:  11/07/2007  DATE OF DISCHARGE:                               OPERATIVE REPORT   PREOPERATIVE DIAGNOSES:  1. Severe facet arthrosis L4-5 with large bilateral synovial cyst.  2. Severe spinal stenosis L4-5.  3. Spondylolisthesis L4-L5.  4. Back and left leg pain.   POSTOPERATIVE DIAGNOSES:  1. Severe facet arthrosis L4-5 with large bilateral synovial cyst.  2. Severe spinal stenosis L4-5.  3. Spondylolisthesis L4-L5.  4. Back and left leg pain.   PROCEDURES:  1. Decompressive Gill procedure L4-5 requiring more work than is      typically required of a typical PLIF procedure with removal of      bilateral synovial cyst and decompression of the L4 and L5 nerve      roots bilaterally.  2. Posterior lumbar interbody fusion L4-5 utilizing 10 x 22-mm Tangent      interbody bone wedge and a 10 x 22-mm Peak interbody cage packed      with local autograft and Actifuse putty.  3. Intertransverse arthrodesis L4-5 utilizing locally harvested      morselized autologous bone graft and Actifuse putty.  4. Nonsegmental fixation L4-5 utilizing the Biomet Polaris pedicle      screw system.   SURGEON:  Dr. Marikay Alar.   ASSISTANT:  Dr. Aliene Beams.   ANESTHESIA:  General tracheal.   COMPLICATIONS:  None apparent.   INDICATIONS FOR PROCEDURE:  Robin [name] is a 70 year old female who  presented with severe left leg pain.  She had a MRI which showed severe  spinal stenosis at L4-5 with grade 1 spondylolisthesis and bilateral  synovial facet cyst.  Because of segmental instability and the severity  of the stenosis, I recommended a decompression with removal of synovial  cyst followed by instrumented fusion.  She understood the  risks,  benefits, and expected outcome of procedure and wished to proceed in  hopes of improving her pain syndrome.   DESCRIPTION OF THE PROCEDURE:  The patient was taken to operating room,  and after induction of adequate generalized endotracheal anesthesia, she  was rolled into the prone position on chest rolls, and all pressure  points were padded.  Her lumbar region was prepped with DuraPrep and  then draped in the usual sterile fashion.  Ten mL of local anesthesia  was injected, and a dorsal midline incision was made and carried down to  the lumbosacral fascia.  The fascia was opened, and the paraspinous  musculature was taken down in the subperiosteal fashion to expose L3-4  and L4-5.  Intraoperative fluoroscopy confirmed my level, and then I  took the dissection out over the facets to expose the transverse  processes of L4 and L5.  The facets were quite arthritic and overgrown.  I removed the spinous process and bent the top of the  facets with a  Leksell rongeur.  I then performed a complete laminectomy,  hemifacetectomy, and foraminotomies at L4-5 to decompress the L4 and L5  nerve root.  I removed the yellow ligament.  There was a large synovial  cyst bilaterally.  The right-sided system was not adherent to the dura  and was removed very easily with the Kerrison punch to perform a wide  foraminotomy on that side and to decompress the L5 and the L4 nerve  roots.  I then coagulated the epidural venous vasculature in preparation  for the PLIF.  I then on the patient's left side found a large synovial  cyst that was adherent to the dura, and I spent considerable time  teasing this away from the dura to completely remove it from the lateral  and posterior part of the dura and the L5 nerve root.  I then widened  the foraminotomy and then finished the decompression of the L4 and the  L5 nerve roots and then coagulated the epidural venous vasculature.  I  then incised the disk space  bilaterally and distracted the disk space up  to 10 mm achieving a nice reduction of her spondylolisthesis.  I then  prepared the endplates with a rotating cutter and the cutting chisel and  the Epstein curettes along with pituitary rongeurs.  A complete  diskectomy was done.  We packed the midline with local autograft and  Actifuse.  We placed a 10 x 22-mm Tangent interbody bone wedge on the  patient's right and a 10 x 22-mm Peak interbody cage packed with local  autograft and Actifuse on the patient's left side, and then the PLIF was  complete.  I then localized the pedicle screw entry zones utilizing  surface landmarks and lateral fluoroscopy.  I started on the patient's  left side.  I probed each pedicle with a pedicle probe, tapped each  pedicle with a 5.5 tap, and then placed six 5 x 45-mm pedicle screws  into the L4 and the L5 pedicles on the left side.  On the right side, I  did the exact same thing, localizing the pedicle screw entry zone,  probed each pedicle, tapped each pedicle with a 5.5 tap.  I probed each  pedicle with a ball probe to assure that I had no break in the cortex,  and I placed a 6.5 x 45-mm pedicle screw into the pedicle at L4 on the  left.  I then did same thing at L5 on the left; however, when I felt  with the pedicle probe, I felt no bone anteriorly.  Therefore, I checked  AP fluoroscopy.  My L4 screw on that side looked very slightly lateral  also, so I wanted to remove that and check and make sure that I was  medial enough with that screw.  I then noticed on the AP fluoroscopy  that she was actually somewhat scoliotic, making the trajectory of the  pedicles on that right side much more medial than it would otherwise  appear.  Therefore, I was able to reprobe each pedicle with a pedicle  probe, retapped each pedicle with a 4.5 followed by 5.5 tap.  I palpated  each with a ball probe and then had very nice bone all around and  therefore placed 6.5 x 45-mm  pedicle screw at L4 and a 6.5 x 40-mm  pedicle screw at L5 on the patient's right side, achieving a nice  purchase.  I then checked this again with lateral  and AP fluoroscopy,  and I liked the position of screws then.  I then decorticated the  transverse processes and placed a mixture of autograft and Actifuse out  over these to perform intertransverse arthrodesis.  I then placed two 40-  mm lordotic rods into the multiaxial screw heads and locked these into  position with locking caps and anti-torque device and then placed a  separate cross-link to give torsional stability to the construct.  I  then irrigated with saline solution containing bacitracin, inspected the  nerve roots once again with a coronary dilator, lined the dura with  Gelfoam, placed a medium Hemovac drain through a separate stab incision  and then closed the muscle and fascia with 0 Vicryl, closed subcutaneous  and subcuticular tissue with 2-0 and 3-0 Vicryl, and closed the skin  with Benzoin and Steri-Strips.  The drapes were removed.  Sterile  dressing was applied.  The patient was awakened from general  anesthesia  and transferred to the recovery room in stable condition.  At the end of  the procedure, all sponge, needle and instrument counts were correct.      Tia Alert, MD  Electronically Signed     DSJ/MEDQ  D:  11/07/2007  T:  11/08/2007  Job:  [id number]

## 2011-02-07 NOTE — Discharge Summary (Signed)
Robin [name], Robin [name] NO.:  [id number]   MEDICAL RECORD NO.:  [id number]          PATIENT TYPE:  INP   LOCATION:  3018                         FACILITY:  [facility]   PHYSICIAN:  Tia Alert, MD     DATE OF BIRTH:  [birth year]   DATE OF ADMISSION:  11/07/2007  DATE OF DISCHARGE:  11/11/2007                               DISCHARGE SUMMARY   ADMISSION DIAGNOSIS:  Spondylolisthesis with a large synovial cyst and  severe spinal stenosis, L4-5.   PROCEDURE:  Posterior lumbar interbody fusion with nonsegmental  instrumentation, L4-5.   BRIEF HISTORY OF PRESENT ILLNESS:  Ms. Cake is a very pleasant 70-year-  old female who presented with severe left leg pain.  She had no weakness  on exam but had an MRI which showed severe spinal stenosis at L4-5 from  bilateral synovial cysts with the left one being the larger cyst,  causing severe canal stenosis.  She had severe facet arthropathy at that  level with a grade 1 anterior listhesis of L4 on L5 suggesting segmental  instability.  I recommended a posterior decompression followed by  instrumented fusion.  She understood the risks, benefits and suspected  outcome and wished to proceed.   HOSPITAL COURSE:  The patient was admitted on November 07, 2007, and  taken to the operating room, where she underwent a posterior lumbar  interbody fusion at L4-5.  The patient tolerated the procedure well and  was taken to the recovery room and then to the floor in stable  condition.  For details of the operative procedure, please see the  dictated operative note.  The patient's hospital course was routine.  There were no complications.  The wound remained clean, dry and intact.  She remained afebrile with stable vital signs.  She tolerated a regular  diet.  She did not use a PCA after surgery as most people do, simply  took Vicodin and Flexeril for pain.  Her Hemovac stayed in place for 2  days.  She mobilized quite easily and quite well  and was ambulating in  the halls without difficulty.  She had good strength in her lower  extremities, good muscle tone and bulk.  She had some postoperative back  pain.  She was discharged to home in stable condition on November 11, 2007 with plans to follow up in 1 week.   FINAL DIAGNOSIS:  Posterior lumbar interbody fusion, L4-5.      Tia Alert, MD  Electronically Signed     DSJ/MEDQ  D:  11/11/2007  T:  11/12/2007  Job:  [id number]

## 2011-02-07 NOTE — Op Note (Signed)
NAMESHEMICKA, Robin [name]                ACCOUNT NO.:  [id number]   MEDICAL RECORD NO.:  [id number]          PATIENT TYPE:  INP   LOCATION:  0005                         FACILITY:  [facility]   PHYSICIAN:  Angelia Mould. Derrell Lolling, M.D.DATE OF BIRTH:  1940/11/29   DATE OF PROCEDURE:  11/12/2008  DATE OF DISCHARGE:                               OPERATIVE REPORT   PREOPERATIVE DIAGNOSIS:  Recurrent cancer, left colon.   POSTOPERATIVE DIAGNOSIS:  Recurrent cancer, left colon.   OPERATION PERFORMED:  1. Left colectomy.  2. Takedown of splenic flexure.   SURGEON:  Angelia Mould. Derrell Lolling, M.D.   FIRST ASSISTANT:  Sandria Bales. Ezzard Standing, M.D.   OPERATIVE INDICATIONS:  This is a 70 year old Caucasian female who  underwent left colectomy on May 22, 2007 for a colon cancer.  Final  pathology showed moderately-differentiated cancer.  Nineteen lymph nodes  were examined and all 19 were negative and we had widely negative  margins.  It was not felt that she would need chemotherapy.  She has  been followed by me and Dr. Arlan Organ.  She has developed left-sided  pain and was found to be anemic and had to have a blood transfusion and  a CT scan, PET scan and endoscopy showed a biopsy-proven recurrent  adenocarcinoma at the anastomosis in the proximal descending colon.  There was no sign of any metastatic disease.  She continues to have  pain.  She has been counseled as an outpatient.  She is brought to the  operating room electively for resection following a bowel prep at home.   OPERATIVE FINDINGS:  The patient had moderately extensive adhesions.  The splenic flexure of the colon was high up near the spleen.  There  were extensive adhesions in the left upper quadrant and in the midline  and in the left paracolic gutter.  She had a relatively small but  obviously palpable tumor at the stapled anastomosis.  We were able to  resect this with widely negative margins.  We were able to perform a  stapled anastomosis.   There is no sign of any metastatic disease  elsewhere in the abdomen or pelvis or liver.   OPERATIVE TECHNIQUE:  Following the induction of general endotracheal  anesthesia, the patient was identified as the correct patient and  correct procedure.  Intravenous antibiotics were given.   Dr. Su Grand performed cystoscopy and insertion of a left ureteral  stent.  He will dictate that separately.   The patient was placed supine.  The abdomen was prepped and draped in a  sterile fashion.  A midline laparotomy incision was made through the old  scar.  The fascia was incised in the midline.  Adhesions were carefully  taken down from the undersurface of the abdominal wall.  The omentum was  very large and generous and we had to spend a good amount of time taking  the omentum down from the right abdominal wall and left abdominal wall.  We ultimately mobilized it all the way superiorly.  We could then lift  it up.  We had to dissect a couple of  loops of proximal jejunum away  from the undersurface of the transverse colon mesentery all the way back  to the ligament of Treitz, but ultimately this was accomplished  uneventfully.   We explored the colon, with findings as described above.  We mobilized  the left transverse colon down off the abdominal wall and then we found  the entrance to the lesser sac and mobilized the colon away from the  greater curvature of the stomach.  We used the LigaSure for this type of  dissection.  We transected the distal transverse colon with a GIA  stapling device.  Mesenteric vessels were divided with the LigaSure  device.  We then mobilized the entire descending colon and spleenic  flexure from inferiorly to superiorly.  We carefully dissected this away  from the lateral abdominal wall.  This took a good deal of time because  of the complexity of the adhesions and the colon being folded over on  itself all the way up into the splenic flexure.  We did ultimately   completely mobilize the distal transverse colon, the splenic flexure,  and the descending colon.  The sigmoid colon was quite long and  redundant.  We transected the mid descending colon about 10 cm distal to  the tumor.  We then mobilized the entire splenic flexure down with the  tumor in our hands.  We divided the mesentery with the LigaSure device.  We sent the specimen to the lab.  We labeled the distal margin with a  silk suture.  Dr. Colonel Bald looked at the specimen and said that he clearly  identified the tumor at the anastomosis and we had widely negative  margins.   When we went to set up the anastomosis, we found a lot of tethering and  redundancy, and so we resected about another 8 or 10 inches of the  distal descending and proximal sigmoid and sent that for routine  histology.  This allowed a nice anastomosis without tension between the  left transverse colon and the proximal sigmoid.  The anastomosis was set  up with sutures and then created with the GIA stapling device.  The  defect in the bowel wall was closed with TA-60 stapling device.  There  was no bleeding from any of the staple lines at any point.  A couple of  sutures of 3-0 silk were placed to reinforce the staple lines at  critical points.  We tested the anastomosis and there did not appear to  be any leak.   At this point we changed our instruments and gloves.  We irrigated out  the abdomen and pelvis and left upper quadrant.  We had a little bit of  bleeding in the left paracolic gutter, which was controlled nicely with  cautery.  We observed this for about 10 minutes and there was no  accumulation of blood, so we felt it was hemostatic.  We irrigated one  more time.   Dr. Ezzard Standing and I discussed whether to close the mesentery.  We felt that  it was a very large defect and that it would be very difficult to close  very tightly and we were concerned that we would get an internal hernia  and obstruction, so we  decided to leave the mesentery widely open.  We  placed in the colon anastomosis back down to the left paracolic gutter  and we pulled the small bowel up and placed it over the top of the  colon.  We  brought the omentum down on top.  The midline fascia was  closed with running suture of #1 double-stranded PDS and the skin closed  with skin staples.  Clean bandages were placed and the patient was taken  to the recovery room in stable condition.   ESTIMATED BLOOD LOSS:  About 200 mL.   COMPLICATIONS:  None.   Sponge, needle and instrument counts were correct.      Angelia Mould. Derrell Lolling, M.D.  Electronically Signed     HMI/MEDQ  D:  11/12/2008  T:  11/12/2008  Job:  [id number]   cc:   Rose Phi. Myna Hidalgo, M.D.  Fax: [phone]   Theressa Millard, M.D.  Fax: [phone]   Danise Edge, M.D.  Fax: [phone]

## 2011-02-07 NOTE — Discharge Summary (Signed)
NAMEMARCELENE, [name]                ACCOUNT NO.:  [id number]   MEDICAL RECORD NO.:  [id number]          PATIENT TYPE:  INP   LOCATION:  1520                         FACILITY:  [facility]   PHYSICIAN:  Angelia Mould. Derrell Lolling, M.D.DATE OF BIRTH:  [birth year]   DATE OF ADMISSION:  11/25/2008  DATE OF DISCHARGE:  11/29/2008                               DISCHARGE SUMMARY   FINAL DIAGNOSIS:  1. Partial small-bowel obstruction, resolved.  2. Post operative peritoneal inflammation, resolved.  3. Recurrent cancer left colon, status post recent resection with      anastomosis.  4. Hypertension.   OPERATIONS PERFORMED:  None.   HISTORY OF PRESENT ILLNESS:  This is a 70 year old Caucasian female who  had a previous colon resection in 2008 of the left colon  for what  turned out to be a  stage T3,N0 cancer.  She developed an anastomotic  recurrence and underwent a second left colon resection on November 12, 2008 with good resection margins and  negative nodes.  She did well in  the hospital was discharged home.  She developed left-sided abdominal  pain and nausea and vomiting and returned to the emergency room.  She  had a good bowel movement the day before she was admitted,  has not  passed any blood.  In the emergency room she had a CT scan which  revealed a lot of mesenteric stranding in the left upper quadrant, near  the anastomotic site.  There were no air bubbles, no fluid collection,  no abscess, and no colon obstruction.   PHYSICAL EXAMINATION:  GENERAL:  The patient was evaluated by Dr. Bertram Savin in the emergency room.  The patient appeared uncomfortable but not  in any acute distress.  VITAL SIGNS:  Temperature was 97.8, pulse 81, blood pressure 146/64.   ABDOMEN:  the abdomen seems distended, the wound looked fine.  No hernia  or drainage.  Tender to palpate the left upper quadrant.   ADMISSION DATA:  White blood cell count of 11,400.  Potassium 3.4, BUN  17, creatinine 1.1.   HOSPITAL COURSE:  The patient was admitted to the hospital by Dr. Bertram Savin.  She did not think there was any evidence of abscess or  anastomotic leak.  There was a question of whether there was partial  small-bowel obstruction and so a nasogastric tube was placed.   I evaluated the patient following morning, on March 4, and she was  feeling much better with marked diminution in pain.  She had a bowel  movement was passing flatus and she felt that her abdominal distention  was improved.  She was afebrile.  At this point I feel we should treat  her conservatively with empiric antibiotics and nasogastric suction.  On  March 5 she continued to feel better and was hungry, was still passing  flatus and having small stools.  Her abdominal exam was reassuring and  her abdominal x-rays showed unremarkable gas pattern and so we removed  the nasogastric tube and began a liquid diet.   Over the next 48 hours she felt better  and was able to resume diet and  bowel function.  We did repeat CT scan which showed some stranding in  the left upper quadrant but overall it was improved.  She was discharged  home on March 7 at which time she was having no pain, was afebrile,  tolerating diet and wanted to go home.   DISCHARGE INSTRUCTIONS:  She was given instructions regarding diet and  activities and arrangements were made to  follow up with me in the  office.      Angelia Mould. Derrell Lolling, M.D.  Electronically Signed     HMI/MEDQ  D:  01/12/2009  T:  01/12/2009  Job:  [id number]

## 2011-02-07 NOTE — Discharge Summary (Signed)
NAMERICHIE, VADALA                ACCOUNT NO.:  [id number]   MEDICAL RECORD NO.:  [id number]          PATIENT TYPE:  INP   LOCATION:  5706                         FACILITY:  [facility]   PHYSICIAN:  Angelia Mould. Derrell Lolling, M.D.DATE OF BIRTH:  [birth year]   DATE OF ADMISSION:  05/22/2007  DATE OF DISCHARGE:  05/28/2007                               DISCHARGE SUMMARY   FINAL DIAGNOSES:  1. Carcinoma of the proximal descending colon, stage T3, N0.  2. Hypertension.  3. Hypothyroidism.  4. Sleep apnea.  5. History of depression.   OPERATION PERFORMED:  Left hemicolectomy with takedown of splenic  flexure, date May 22, 2007.   HISTORY:  This is a 70 year old white female who recently underwent  colonoscopy and was found to have an ulcerated, circumferential tumor  starting at 30 cm and extending to 35 cm beyond the anal verge.  Biopsy  showed colonic adenocarcinoma.  Her preoperative CT scan shows an 11 mm  hypodense area in the center of the right lobe of the liver which is  felt to represent a hemangioma.  The patient has been advised of this.  She underwent bowel prep at home and was brought to the operating room  electively following her admission.   For details of her past history and family history and social history,  please see the admission note.   PHYSICAL EXAMINATION:  GENERAL:  A pleasant middle-aged woman in no  distress.  VITAL SIGNS:  Height 5 feet 3 inches, weight 151 pounds.  NECK:  No mass, no adenopathy.  LUNGS:  Clear to auscultation.  HEART:  Regular rate and rhythm, no murmur.  ABDOMEN:  Soft.  Liver and spleen not enlarged.  Well-healed right lower  quadrant scar from previous appendectomy.  Well-healed Pfannenstiel  incision.  Minimal right lower quadrant tenderness.  No guarding, no  mass.   ADMISSION DATA:  Hemoglobin 12.2, white blood cell count 8300.  Complete  metabolic panel unremarkable.  CEA 0.9.   HOSPITAL COURSE:  On day of admission, the patient  was taken to the  operating room and underwent exploration.  I found that she had about a  3-4 cm tumor in the proximal descending colon with [place] ink tattoo  staining around it.  There were no enlarged lymph nodes.  The splenic  flexure was taken down and a segmental colon resection performed, and we  were able to perform an anastomosis with stapling devices.   Final pathology report reveals an invasive moderately differentiated  adenocarcinoma, 4.2 cm, invading through the muscularis propria into the  subserosa.  Nineteen lymph nodes were examined, and all 19 were  negative, making this a stage T3, N0 tumor.  The patient was informed of  her pathologic diagnosis.  She was informed that arrangements will be  made for a medical oncologist to see her as an outpatient.   Postoperatively, the patient did fairly well.  She began passing flatus  on postoperative day #3 and had a bowel movement, as well, with a little  bit of old blood in it.  We slowly advance her diet  and activities  thereafter.  She was ready for discharge on May 28, 2007.  At that  time, she was ambulating independently, was afebrile with stable vital  signs.  Abdomen was soft and benign.  The wound looked good.  She was  asked to return to the office in 5-7 days for staple removal.  She was  given a prescription for Vicodin for pain.  She was told to continue all  of her usual medications which include Maxzide, Prozac, fluticasone  inhaler, multivitamins, levothyroxine, calcium, Lipitor, amlodipine,  clonazepam and glucosamine.      Angelia Mould. Derrell Lolling, M.D.  Electronically Signed     HMI/MEDQ  D:  07/16/2007  T:  07/17/2007  Job:  [id number]   cc:   Theressa Millard, M.D.  Danise Edge, M.D.

## 2011-02-07 NOTE — Op Note (Signed)
Robin [name], Robin [name] NO.:  [id number]   MEDICAL RECORD NO.:  [id number]          PATIENT TYPE:  INP   LOCATION:  2899                         FACILITY:  [facility]   PHYSICIAN:  Angelia Mould. Derrell Lolling, M.D.DATE OF BIRTH:  15-May-1941   DATE OF PROCEDURE:  05/22/2007  DATE OF DISCHARGE:                               OPERATIVE REPORT   PREOPERATIVE DIAGNOSIS:  Cancer of the left colon at 30 cm.   POSTOPERATIVE DIAGNOSIS:  Carcinoma of the proximal descending colon.   OPERATION PERFORMED:  Left hemicolectomy, takedown of splenic flexure.   SURGEON:  Dr. Claud Kelp.   FIRST ASSISTANT:  Wilmon Arms. Tsuei, M.D.   OPERATIVE INDICATIONS:  This is a 70 year old white female who recently  underwent a colonoscopy and was found to have an ulcerated,  circumferential tumor starting at 30 cm and extending to 35 cm beyond  the anal verge.  Biopsy showed invasive colonic adenocarcinoma.  No  other abnormalities were noted.  Her preoperative CT scan shows a 11 mm  hypodense area in the center of the right lobe of the liver which is  thought to probably represent a hemangioma but further follow-up was  felt to be warranted.  The patient was advised of this.  She has  undergone her bowel prep and is brought to operating room electively.   OPERATIVE FINDINGS:  The patient had about a 3 cm to 4 cm tumor in the  proximal descending colon with lots of [place] ink tattoo around it.  [place] ink staining went back into Gerota's fascia.  I did not feel any  enlarged mesenteric or retroperitoneal lymph nodes.  The liver felt  normal both right and left lobes.  The stomach and omentum felt normal.  There was no ascites.  The left ureter was identified and preserved.   TECHNIQUE:  Following induction of general endotracheal anesthesia, the  patient's abdomen was prepped and draped in sterile fashion.  The  patient was identified as to correct patient and correct procedure and  intravenous  antibiotics were given prior to the incision.  Short midline  incision was made extending both above and below the umbilicus.  The  fascia was incised in the midline.  The abdominal cavity entered under  direct vision.  On exploration and palpation I found the tumor was high  in the descending colon and so I extended the incision cephalad.  Self-  retaining retractors were placed.  The abdomen was thoroughly explored  with findings as described above.   I mobilized the proximal sigmoid colon and the descending colon at the  splenic flexure.  I divided the lateral peritoneal attachments with  electrocautery and mobilized the colon from lateral to medial.  The left  ureter was identified and preserved.  We carefully took down the splenic  flexure.  There was lots of adhesions and the colon was folded upon  itself at the splenic flexure because of chronic adhesions and omental  adhesions and this took some time to slowly sort out but ultimately we  had the complete splenic flexure mobilized and the complete  descending  colon mobilized.  We decided to transect the descending colon about 10  cm distal to the tumor.  We transected the transverse colon just  proximal to the splenic flexure about 10 cm proximal to the tumor.  This  was done with a GIA stapling device.  I scored the mesentery all the way  back toward the retroperitoneum.  We divided the mesentery with LigaSure  device.  The specimen was sent to the lab.  The pathologist examine this  and found we had at least 10 cm margin both proximal and distal to the  tumor.   We found that we could set up the anastomosis to do a stapled  anastomoses.  We packed all small bowel away to where we could see the  proximal and distal colon and the mesentery.  We aligned the proximal  distal ends of the colon using 3-0 silk sutures.  The anastomosis was  created using GIA stapling device.  We inspected the staple lines.  There was no bleeding.  The  defect in the bowel wall was closed with TA-  60 stapling device.  At this point we changed our gloves and instruments  and suction devices and cautery devices.  We placed a few extra sutures  in the staple line for reinforcement at critical areas.  We checked the  lumen and it was more than two finger breadths wide.  There was no  kinking or twisting.  There was a very minimal defect in the mesentery  and so we elected not to pull the colon up and tried to close that and  just let it anatomically go back into the left paracolic gutter.  We  irrigated out the abdomen and subphrenic space and pelvis with several  liters of saline.  Hemostasis was excellent.  The left ureter was  identified one more time and looked fine.  The anesthesiologist inserted  a nasogastric tube.  The omentum was returned to its anatomic position.  Midline fascia was closed with running suture of double-stranded #1 PDS  and after irrigating the subcutaneous tissue, the skin was closed with  skin staples.  Clean bandages were placed and the patient taken to  recovery room in stable condition.  Estimated blood loss was about 200  mL.  Complications none.  Sponge, needle and instrument counts were  correct.      Angelia Mould. Derrell Lolling, M.D.  Electronically Signed     HMI/MEDQ  D:  05/22/2007  T:  05/23/2007  Job:  [id number]   cc:   Theressa Millard, M.D.  Danise Edge, M.D.

## 2011-02-07 NOTE — Op Note (Signed)
Robin [name], Robin [name]                ACCOUNT NO.:  [id number]   MEDICAL RECORD NO.:  [id number]          PATIENT TYPE:  INP   LOCATION:  1537                         FACILITY:  [facility]   PHYSICIAN:  Lindaann Slough, M.D.  DATE OF BIRTH:  12/26/1940   DATE OF PROCEDURE:  11/12/2008  DATE OF DISCHARGE:                               OPERATIVE REPORT   PREOPERATIVE DIAGNOSIS:  Sigmoid colon cancer.   POSTOPERATIVE DIAGNOSES:  Sigmoid colon cancer.   PROCEDURE:  Cystoscopy and insertion of left ureteral catheter.   SURGEON:  Dr. Brunilda Payor.   ANESTHESIA:  General.   INDICATIONS:  The patient is a 70 year old female who is scheduled for  colon resection by Dr. Derrell Lolling.  He asked me to insert a left ureteral  catheter prior to the procedure.   The patient was identified by her wrist band and a proper timeout was  taken.   She was then placed in the dorsal lithotomy position.  A panendoscope  was inserted in the bladder.  The bladder mucosa was normal.  There was  no stone or tumor in the bladder.  There was no compression of the  bladder by any extrinsic tumor.  The ureteral orifices were in normal  position and shape.  A Glidewire was then passed through the cystoscope  and a #6-French open-ended catheter was passed over the Glidewire and  the Glidewire was passed through the left ureteral orifice and advanced  in the ureter.  The open-ended catheter was then advanced over the  Glidewire up into the renal pelvis.  The Glidewire was then removed.  The bladder was emptied and the cystoscope removed.  A #16 Foley  catheter was then inserted in the bladder.  The ureteral catheter was  attached to a Goldberg attachment and secured to the Foley catheter.   The patient was then left on the surgical table for surgery by Dr.  Derrell Lolling.      Lindaann Slough, M.D.  Electronically Signed     MN/MEDQ  D:  11/12/2008  T:  11/12/2008  Job:  [id number]

## 2011-02-07 NOTE — Discharge Summary (Signed)
NAMEJULIEN, Robin [name]                ACCOUNT NO.:  [id number]   MEDICAL RECORD NO.:  [id number]          PATIENT TYPE:  INP   LOCATION:  1537                         FACILITY:  [facility]   PHYSICIAN:  Angelia Mould. Derrell Lolling, M.D.DATE OF BIRTH:  [birth year]   DATE OF ADMISSION:  11/12/2008  DATE OF DISCHARGE:  11/18/2008                               DISCHARGE SUMMARY   FINAL DIAGNOSES:  1. Recurrent adenocarcinoma of the splenic flexure of the colon, stage      3, N0.  2. Status post left colectomy of May 22, 2007 for adenocarcinoma,      also stage T3, N0.  3. History of sleep apnea.  4. Hypertension.  5. Hypothyroidism.   OPERATION PERFORMED:  Lysis of adhesions, left colectomy with takedown  of splenic flexure, date November 12, 2008.   HISTORY:  This is a 70 year old white female who presented with a  carcinoma of the proximal descending colon and underwent left colectomy  with takedown splenic flexure on May 22, 2007.  The final pathology  at that time showed a 4.2 cm moderately differentiated cancer with 19  lymph nodes being examined and all 19 being negative and widely negative  margins.  She was evaluated by Dr. Arlan Organ and there did not  appear to be any indication for adjuvant chemotherapy or radiation  therapy at that time.   She presented at this time with an 2-month history of left-sided pain  and anemia and actually had to have a blood transfusion.  She may have  seen a little bit of blood in her stools but mostly her stools had not  changed.  Following the transfusion, her anemia recurred and she  underwent upper endoscopy which was unremarkable and colonoscopy to the  cecum which revealed a recurrent colon cancer in the part descending  colon at the anastomosis, said to be circumferential and 5 cm in size.  Biopsies confirmed adenocarcinoma.   She was evaluated thereafter by Dr. Arlan Organ as an outpatient.  Restaging was done with CT scanning showing  small stable pulmonary  nodules in the left and an ill-defined 12 mm lesions anteriorly in the  right lobe of the liver which were stable compared to previous CT and  thought to probably be benign.  There was mild colonic wall thickening  anteriorly and medially around the anastomosis clips in the descending  colon but no ascites or adenopathy.  A PET CT was also done showing  abnormal activity of the descending colon at the level of the  anastomosis but no evidence of the abnormal activity elsewhere.   The patient was referred to me at that time and I felt that it would be  appropriate to we explored her and resect this area with curative  intent.  She was counseled regarding this and was in full agreement.   HOSPITAL COURSE:  The patient was brought to the hospital electively  following a bowel prep done at home.  She was taken to operating room on  February 1810 and explored.  It was found that she had extensive  adhesions  filling the left paracolic gutter in the left upper quadrant.  I found small palpable tumor high in the colon and although this was  easily palpable, it was not that bulky.  The adhesions were extensive  and the colon was folded over upon itself.  We resected another generous  segment of colon and we were able to perform  anastomosis between the  two healthy segments of proximal and distal colon.  We saw no evidence  of metastatic disease.  Dr. Su Grand placed the left ureteral stent  prior to the procedure and this was removed the following the procedure.   The final pathology report showed that she had a moderately  differentiated adenocarcinoma with maximum tumor size of 6.6 cm. Twelve  lymph nodes were examined all were negative.  Lympho- vascular invasion  was not identified.  This was staged out as a T3, N0 tumor with negative  proximal, distal and radial margins..   Dr. Arlan Organ follow her while she was in the hospital and arrange  for outpatient  follow-up for consideration of adjuvant chemotherapy.   Postoperatively she did fairly well.  She was on Entereg protocol.  She  was allowed her oral amlodipine for her hypertension.  She tolerated  clear liquids early on.  She had some atelectasis and borderline oxygen  saturation but that got better with pulmonary toilet and ambulation.  By  postop day #4 she had a bowel movement and she progressed in her diet  and activities and bowel function there after.  She was ready for  discharge on November 18, 2008.  She was afebrile.  Her wounds looked  good.  She was having bowel movements.  Her pathology was discussed with  her.   She was asked to follow up with me in my office in 1 week.  She was also  asked to follow-up with Dr. Arlan Organ in the next week or two.   DISCHARGE MEDICATIONS:  1. Vicodin for pain.  2. Maxzide 25 mg a day.  3. Prozac 20 mg.  4. Fluticasone 2 puffs each nostril daily p.r.n.  5. Multivitamins daily.  6. Synthroid 0.088 mg daily.  7. Calcium.  8. Vitamin D.  9. Lipitor 10 mg daily.  10.Amlodipine 5 mg daily.      Angelia Mould. Derrell Lolling, M.D.  Electronically Signed     HMI/MEDQ  D:  12/07/2008  T:  12/07/2008  Job:  [id number]   cc:   Rose Phi. Myna Hidalgo, M.D.  Fax: [phone]   Theressa Millard, M.D.  Fax: [phone]   Danise Edge, M.D.  Fax: [phone]

## 2011-02-07 NOTE — H&P (Signed)
Robin [name], Robin [name]                ACCOUNT NO.:  [id number]   MEDICAL RECORD NO.:  [id number]          PATIENT TYPE:  INP   LOCATION:  1520                         FACILITY:  [facility]   PHYSICIAN:  Lennie Muckle, MD      DATE OF BIRTH:  10-14-1940   DATE OF ADMISSION:  11/25/2008  DATE OF DISCHARGE:                              HISTORY & PHYSICAL   DIAGNOSIS:  Small bowel obstruction, inflammatory stranding.   Robin [name] is a 70 year old female who had undergone a resection of a  left colon cancer.  She had had previous resection by Dr. Claud Kelp  in 2008, had a recurrence of metastasis at the anastomotic site.  She  had a colon resection on November 12, 2008.  She was doing remarkably  well until Tuesday.  She had onset of left-sided abdominal pain.  She  began having nausea and vomiting and has not been able to keep anything  down today.  No fevers or chills.  No Flatus in the past 24 hours.  She  had a good bowel movement yesterday.  No hematochezia or melena.  She  did have a CT scan in the emergency department which revealed a large  amount of stranding in the left upper quadrant.  It was close to the  anastomotic side in the mesentery, no fluid was seen, no focal fluid  collection.  There was evidence of an obstruction coming up to the  anastomotic site.  Her white count is mildly elevated at 11.4.  She  states that she has no relieving or aggravating factors.  She is  somewhat distended.   PAST MEDICAL HISTORY:  1. Hypertension.  2. Hypothyroidism.  3. Dementia.  4. Depression.   FAMILY HISTORY:  Noncontributory.   SOCIAL HISTORY:  She is married.  Has no tobacco or alcohol use.   MEDICATIONS:  Include amlodipine, Lipitor, Maxzide, Prozac and  Synthroid.   NO DRUG ALLERGIES.   REVIEW OF SYSTEMS:  Negative.   PHYSICAL EXAMINATION:  She is lying in a stretcher, appears  uncomfortable but in no acute distress.  Temperature 97.8, pulse 81,  blood pressure 146/64.  HEENT:  Head is normocephalic, oral mucosa is slightly dry, sclerae are  clear.  CHEST:  Clear to auscultation bilaterally, normal expansion and  excursion.  CARDIOVASCULAR:  Regular rate and rhythm.  ABDOMEN:  Somewhat distended, she has no erythema at her incision site,  Steri-Strips in place.  She is somewhat tender to palpation in the left  upper quadrant.  No peritoneal signs.  EXTREMITIES:  She has mild deformities to the digits in her hands but no  pain with palpation.  SKIN:  No rashes are seen.  NEUROLOGIC:  No focal deficits are seen.   CT scan is reviewed, I do see marked inflammation around the anastomosis  site and see no evidence of any abscess.   LABORATORY DATA:  Revealed mild hypokalemia at 3.4, BUN/creatinine 17  and 1.1.   ASSESSMENT AND PLAN:  Inflammatory infectious process near the  anastomotic site.  No focal fluid collection to drain nor is  there any  physical evidence to suggest a leak.  Will place on Zosyn empirically,  repeat likely CT scan over the next 5 days.  We will place NG tube for  decompression due to findings of a bowel obstruction.  Maintain NPO  status.  Hydrate with IV fluids to correct electrolyte imbalance.  Hyponatremia will be replaced by potassium.  Repeat her labs in the  morning.  Will notify Dr. Derrell Lolling of her admission and defer to him  further treatment.      Lennie Muckle, MD  Electronically Signed     ALA/MEDQ  D:  11/25/2008  T:  11/26/2008  Job:  [id number]

## 2011-02-10 NOTE — Procedures (Signed)
NAMEFLORNCE, RECORD                ACCOUNT NO.:  [id number]   MEDICAL RECORD NO.:  [id number]          PATIENT TYPE:  OUT   LOCATION:  SLEEP CENTER                 FACILITY:  [facility]   PHYSICIAN:  Clinton D. Maple Hudson, M.D. DATE OF BIRTH:  1940/10/04   DATE OF STUDY:  01/12/2005                              NOCTURNAL POLYSOMNOGRAM   STUDY DATE:  January 12, 2005   REFERRING PHYSICIAN:  Dr. Theressa Millard   INDICATION FOR STUDY:  Hypersomnia with sleep apnea.  Epworth Sleepiness  Score 12/24.   SLEEP ARCHITECTURE:  Total sleep time 272 minutes with sleep efficiency 61%.  Stage I was 8%, stage II 49%, stages III and IV 17%, REM was 25% of total  sleep time.  Sleep latency 45 minutes, REM latency 132 minutes, awake after  sleep onset 134 minutes, arousal index 28.  No sleep medication was  recorded.   RESPIRATORY DATA:  Split-study protocol.  Respiratory disturbance index  (RDI, AHI) 32.2 obstructive events per hour indicating moderate obstructive  sleep apnea/hypopnea syndrome before CPAP.  This included 47 obstructive  apneas, 3 central apneas and 21 hypopneas before CPAP.  Events were recorded  while sleeping on her right side and supine.  REM RDI 12.4.  CPAP was  titrated to 13 CWP, RDI 0 per hour.  A petite ComfortGel Mask was used with  heated humidifier.   OXYGEN DATA:  Loud snoring with oxygen desaturation to a nadir of 82% before  CPAP.  With CPAP control saturation held 96% on room air.   CARDIAC DATA:  Normal sinus rhythm.   MOVEMENT/PARASOMNIA:  Occasional leg jerk, insignificant.   IMPRESSION/RECOMMENDATION:  1.  Moderately severe obstructive sleep apnea/hypopnea syndrome, respiratory      disturbance index 32.2 per hour with loud snoring and oxygen      desaturation to 82%.  2.  Successful continuous positive airway pressure titration to 13 CWP,      respiratory disturbance index 0 per hour using a petite ComfortGel Mask      with heated humidifier.    CDY/MEDQ  D:   01/15/2005 12:17:31  T:  01/15/2005 13:40:06  Job:  [id number]

## 2011-04-26 ENCOUNTER — Encounter (HOSPITAL_BASED_OUTPATIENT_CLINIC_OR_DEPARTMENT_OTHER): Payer: Medicare Other | Admitting: Hematology & Oncology

## 2011-04-26 ENCOUNTER — Other Ambulatory Visit: Payer: Self-pay | Admitting: Family

## 2011-04-26 DIAGNOSIS — R7989 Other specified abnormal findings of blood chemistry: Secondary | ICD-10-CM

## 2011-04-26 DIAGNOSIS — C186 Malignant neoplasm of descending colon: Secondary | ICD-10-CM

## 2011-04-26 LAB — COMPREHENSIVE METABOLIC PANEL
Albumin: 4.5 g/dL (ref 3.5–5.2)
BUN: 12 mg/dL (ref 6–23)
CO2: 25 mEq/L (ref 19–32)
Glucose, Bld: 114 mg/dL — ABNORMAL HIGH (ref 70–99)
Potassium: 3.9 mEq/L (ref 3.5–5.3)
Sodium: 141 mEq/L (ref 135–145)
Total Bilirubin: 0.5 mg/dL (ref 0.3–1.2)
Total Protein: 7.1 g/dL (ref 6.0–8.3)

## 2011-04-26 LAB — CEA: CEA: 1.2 ng/mL (ref 0.0–5.0)

## 2011-05-01 ENCOUNTER — Other Ambulatory Visit: Payer: Self-pay | Admitting: Family

## 2011-05-01 ENCOUNTER — Other Ambulatory Visit: Payer: Self-pay | Admitting: Hematology & Oncology

## 2011-05-01 DIAGNOSIS — C186 Malignant neoplasm of descending colon: Secondary | ICD-10-CM

## 2011-05-16 ENCOUNTER — Other Ambulatory Visit: Payer: Self-pay | Admitting: Family

## 2011-05-16 ENCOUNTER — Ambulatory Visit (HOSPITAL_COMMUNITY)
Admission: RE | Admit: 2011-05-16 | Discharge: 2011-05-16 | Disposition: A | Discharge status: Home / Self Care | Payer: Medicare Other | Source: Ambulatory Visit | Attending: Family | Admitting: Family

## 2011-05-16 DIAGNOSIS — Z90710 Acquired absence of both cervix and uterus: Secondary | ICD-10-CM | POA: Insufficient documentation

## 2011-05-16 DIAGNOSIS — R7989 Other specified abnormal findings of blood chemistry: Secondary | ICD-10-CM | POA: Insufficient documentation

## 2011-05-16 DIAGNOSIS — Z9089 Acquired absence of other organs: Secondary | ICD-10-CM | POA: Insufficient documentation

## 2011-05-16 DIAGNOSIS — C186 Malignant neoplasm of descending colon: Secondary | ICD-10-CM

## 2011-05-16 DIAGNOSIS — C189 Malignant neoplasm of colon, unspecified: Secondary | ICD-10-CM | POA: Insufficient documentation

## 2011-05-16 DIAGNOSIS — Z9221 Personal history of antineoplastic chemotherapy: Secondary | ICD-10-CM | POA: Insufficient documentation

## 2011-05-16 MED ORDER — IOHEXOL 300 MG/ML  SOLN
100.0000 mL | Freq: Once | INTRAMUSCULAR | Status: AC | PRN
  Administered 2011-05-16: 100 mL via INTRAVENOUS

## 2011-06-16 ENCOUNTER — Encounter (HOSPITAL_BASED_OUTPATIENT_CLINIC_OR_DEPARTMENT_OTHER): Payer: Medicare Other | Admitting: Hematology & Oncology

## 2011-06-16 ENCOUNTER — Other Ambulatory Visit: Payer: Self-pay | Admitting: Family

## 2011-06-16 DIAGNOSIS — K738 Other chronic hepatitis, not elsewhere classified: Secondary | ICD-10-CM

## 2011-06-16 DIAGNOSIS — R7989 Other specified abnormal findings of blood chemistry: Secondary | ICD-10-CM

## 2011-06-16 DIAGNOSIS — C186 Malignant neoplasm of descending colon: Secondary | ICD-10-CM

## 2011-06-16 LAB — CBC
HCT: 34.1 — ABNORMAL LOW
Hemoglobin: 11.8 — ABNORMAL LOW
MCHC: 34.7
MCV: 85.9
RDW: 13.7

## 2011-06-16 LAB — DIFFERENTIAL
Basophils Absolute: 0
Basophils Relative: 0
Eosinophils Absolute: 0.2
Eosinophils Relative: 4
Monocytes Absolute: 0.5
Monocytes Relative: 9
Neutro Abs: 2.9

## 2011-06-16 LAB — BASIC METABOLIC PANEL
CO2: 30
Calcium: 9.8
Chloride: 106
Glucose, Bld: 106 — ABNORMAL HIGH
Sodium: 141

## 2011-06-16 LAB — APTT: aPTT: 25

## 2011-06-16 LAB — HEPATIC FUNCTION PANEL
Bilirubin, Direct: 0.1
Indirect Bilirubin: 0.5
Total Bilirubin: 0.6

## 2011-06-16 LAB — PROTIME-INR
INR: 0.9
Prothrombin Time: 12.5

## 2011-06-19 ENCOUNTER — Other Ambulatory Visit: Payer: Self-pay | Admitting: Internal Medicine

## 2011-06-19 DIAGNOSIS — Z1231 Encounter for screening mammogram for malignant neoplasm of breast: Secondary | ICD-10-CM

## 2011-06-19 LAB — COMPREHENSIVE METABOLIC PANEL
ALT: 88 U/L — ABNORMAL HIGH (ref 0–35)
CO2: 27 mEq/L (ref 19–32)
Calcium: 9.8 mg/dL (ref 8.4–10.5)
Chloride: 100 mEq/L (ref 96–112)
Creatinine, Ser: 1.02 mg/dL (ref 0.50–1.10)
Glucose, Bld: 81 mg/dL (ref 70–99)
Total Bilirubin: 0.6 mg/dL (ref 0.3–1.2)

## 2011-06-19 LAB — HEPATITIS PANEL, ACUTE
HCV Ab: NEGATIVE
Hep B C IgM: NEGATIVE
Hepatitis B Surface Ag: NEGATIVE

## 2011-06-21 ENCOUNTER — Other Ambulatory Visit: Payer: Self-pay | Admitting: Hematology & Oncology

## 2011-06-22 ENCOUNTER — Ambulatory Visit (HOSPITAL_BASED_OUTPATIENT_CLINIC_OR_DEPARTMENT_OTHER)
Admission: RE | Admit: 2011-06-22 | Discharge: 2011-06-22 | Disposition: A | Discharge status: Home / Self Care | Payer: Medicare Other | Source: Ambulatory Visit | Attending: Hematology & Oncology | Admitting: Hematology & Oncology

## 2011-06-22 ENCOUNTER — Ambulatory Visit (INDEPENDENT_AMBULATORY_CARE_PROVIDER_SITE_OTHER)
Admission: RE | Admit: 2011-06-22 | Discharge: 2011-06-22 | Disposition: A | Discharge status: Home / Self Care | Payer: Medicare Other | Source: Ambulatory Visit | Attending: Internal Medicine | Admitting: Internal Medicine

## 2011-06-22 DIAGNOSIS — R1013 Epigastric pain: Secondary | ICD-10-CM | POA: Insufficient documentation

## 2011-06-22 DIAGNOSIS — K7689 Other specified diseases of liver: Secondary | ICD-10-CM | POA: Insufficient documentation

## 2011-06-22 DIAGNOSIS — R945 Abnormal results of liver function studies: Secondary | ICD-10-CM | POA: Insufficient documentation

## 2011-06-22 DIAGNOSIS — Z1231 Encounter for screening mammogram for malignant neoplasm of breast: Secondary | ICD-10-CM

## 2011-06-30 ENCOUNTER — Ambulatory Visit: Payer: Medicare Other

## 2011-07-07 LAB — COMPREHENSIVE METABOLIC PANEL
Albumin: 4
Alkaline Phosphatase: 61
BUN: 10
Calcium: 9.9
Glucose, Bld: 94
Potassium: 3.6
Sodium: 136
Total Protein: 6.6

## 2011-07-07 LAB — BASIC METABOLIC PANEL
BUN: 10
BUN: 8
Creatinine, Ser: 0.85
Creatinine, Ser: 0.91
GFR calc Af Amer: >60
GFR calc non Af Amer: >60
GFR calc non Af Amer: >60
Glucose, Bld: 166 — ABNORMAL HIGH
Potassium: 3.5
Potassium: 4.4

## 2011-07-07 LAB — URINALYSIS, ROUTINE W REFLEX MICROSCOPIC
Bilirubin Urine: NEGATIVE
Nitrite: NEGATIVE
Specific Gravity, Urine: 1.003 — ABNORMAL LOW
Urobilinogen, UA: 0.2
pH: 7

## 2011-07-07 LAB — DIFFERENTIAL
Lymphs Abs: 2.3
Monocytes Absolute: 0.5
Monocytes Relative: 7
Neutro Abs: 5.3
Neutrophils Relative %: 64

## 2011-07-07 LAB — CBC
HCT: 32.9 — ABNORMAL LOW
HCT: 35.5 — ABNORMAL LOW
MCV: 86.5
Platelets: 208
Platelets: 240
RDW: 12.6
RDW: 12.7
WBC: 8.3

## 2011-07-07 LAB — TYPE AND SCREEN: Antibody Screen: NEGATIVE

## 2011-07-07 LAB — ABO/RH: ABO/RH(D): O POS

## 2011-07-07 LAB — CEA: CEA: 0.9

## 2011-07-07 LAB — URINE MICROSCOPIC-ADD ON

## 2011-10-26 ENCOUNTER — Ambulatory Visit (HOSPITAL_BASED_OUTPATIENT_CLINIC_OR_DEPARTMENT_OTHER): Payer: Medicare Other | Admitting: Hematology & Oncology

## 2011-10-26 ENCOUNTER — Other Ambulatory Visit (HOSPITAL_BASED_OUTPATIENT_CLINIC_OR_DEPARTMENT_OTHER): Payer: Medicare Other | Admitting: Lab

## 2011-10-26 VITALS — BP 166/68 | HR 98 | Temp 97.4°F | Ht 62.0 in | Wt 166.0 lb

## 2011-10-26 DIAGNOSIS — C186 Malignant neoplasm of descending colon: Secondary | ICD-10-CM

## 2011-10-26 DIAGNOSIS — C189 Malignant neoplasm of colon, unspecified: Secondary | ICD-10-CM

## 2011-10-26 LAB — CBC WITH DIFFERENTIAL (CANCER CENTER ONLY)
BASO%: 0.3 % (ref 0.0–2.0)
HCT: 36.4 % (ref 34.8–46.6)
LYMPH#: 2.1 10*3/uL (ref 0.9–3.3)
MONO#: 0.7 10*3/uL (ref 0.1–0.9)
Platelets: 156 10*3/uL (ref 145–400)
RBC: 4.09 10*6/uL (ref 3.70–5.32)
RDW: 12.7 % (ref 11.1–15.7)
WBC: 6.8 10*3/uL (ref 3.9–10.0)

## 2011-10-26 LAB — CEA: CEA: 2.1 ng/mL (ref 0.0–5.0)

## 2011-10-26 LAB — COMPREHENSIVE METABOLIC PANEL
AST: 31 U/L (ref 0–37)
Alkaline Phosphatase: 48 U/L (ref 39–117)
BUN: 14 mg/dL (ref 6–23)
Glucose, Bld: 109 mg/dL — ABNORMAL HIGH (ref 70–99)
Total Bilirubin: 0.8 mg/dL (ref 0.3–1.2)

## 2011-10-26 NOTE — Progress Notes (Signed)
This office note has been dictated.

## 2011-10-27 ENCOUNTER — Telehealth: Payer: Self-pay | Admitting: Hematology & Oncology

## 2011-10-27 NOTE — Progress Notes (Signed)
CC:   Robin [name], M.D. Robin [name]. Robin [name], M.D. Robin [name], M.D.  DIAGNOSIS:  Locally recurrent adenocarcinoma of the colon.  CURRENT THERAPY:  Observation.  INTERIM HISTORY:  Robin [name] comes in for followup.  She comes in every 6 months.  She has had no complaints since I last saw her.  She is having some abdominal discomfort.  She is having some elevated liver function tests.  It is hard to say why she is having these issues.  Her last scan was done back in I think August.  This did not show any evidence of recurrent disease.  We did do an ultrasound of the abdomen in late September.  This also looked okay without any evidence of abnormalities.  Her last CEA back in August was 1.2.  Her appetite has been good.  She has had no cough.  There has been no bony pain.  There has been no leg swelling.  She has had no rashes.  PHYSICAL EXAM:  This is a well-developed, well-nourished white female in no obvious distress.  Vital signs:  Show temperature of 97.4, pulse 98, respiratory rate 18, blood pressure 166/68.  Weight is 166.  Head and neck exam:  Shows a normocephalic, atraumatic skull.  There are no ocular or oral lesions.  There is no scleral icterus.  There is no palpable cervical or supraclavicular lymph nodes.  Lungs:  Clear bilaterally.  Cardiac examination:  Regular rate and rhythm with a normal S1 and S2.  There are no murmurs, rubs or bruits.  Abdominal exam:  Soft with good bowel sounds.  There is no palpable abdominal mass.  There is no fluid wave.  She has well-healed laparotomy scar. There is no palpable hepatosplenomegaly.  Back exam:  No tenderness over the spine, ribs, or hips.  Extremities:  Shows no clubbing, cyanosis or edema.  Neurological exam:  Shows no focal neurological deficit.  LAB:  White cell count 6.8, hemoglobin 12.5, hematocrit 36.4, platelet count 156,000. Her LFTs are normal.  SGPT is 33, SGOT is 31. Her CEA is 2.1.  IMPRESSION:  [name] is a 71 year old white female with history of locally recurrent adenocarcinoma of the colon. She underwent resection followed by chemotherapy.  She received a FOLFOX chemotherapy that was completed back in September 2010.  Apparently her LFTs have normalized now.  Hopefully, this will maintain stability. She had been on Zocor.  She had stopped this however, probably about 9 months ago. We will go ahead and set her up with a CT scan in February.  I will plan to see her back afterwards so we can continue close followup.    ______________________________ Robin [name], M.D. PRE/MEDQ  D:  10/26/2011  T:  10/27/2011  Job:  [id number]  ADDENDUM:  CEA is 2.1.

## 2011-10-27 NOTE — Telephone Encounter (Signed)
Mailed 2-28 CT appointment with instruction sheets, including to pick up contrast prior to CT and to be NPO 4 hrs prior.

## 2011-11-01 ENCOUNTER — Telehealth: Payer: Self-pay | Admitting: Hematology & Oncology

## 2011-11-01 NOTE — Telephone Encounter (Signed)
Mailed lab results from last appt. to patient's home address.

## 2011-11-01 NOTE — Telephone Encounter (Signed)
Pt. Called requested lab results from most recent appt. Asked that we mail the results to her.

## 2011-11-23 ENCOUNTER — Ambulatory Visit (HOSPITAL_BASED_OUTPATIENT_CLINIC_OR_DEPARTMENT_OTHER)
Admission: RE | Admit: 2011-11-23 | Discharge: 2011-11-23 | Disposition: A | Discharge status: Home / Self Care | Payer: Medicare Other | Source: Ambulatory Visit | Attending: Hematology & Oncology | Admitting: Hematology & Oncology

## 2011-11-23 ENCOUNTER — Other Ambulatory Visit (HOSPITAL_BASED_OUTPATIENT_CLINIC_OR_DEPARTMENT_OTHER): Payer: Medicare Other

## 2011-11-23 DIAGNOSIS — Z85038 Personal history of other malignant neoplasm of large intestine: Secondary | ICD-10-CM | POA: Insufficient documentation

## 2011-11-23 DIAGNOSIS — K7689 Other specified diseases of liver: Secondary | ICD-10-CM | POA: Insufficient documentation

## 2011-11-23 DIAGNOSIS — Z0389 Encounter for observation for other suspected diseases and conditions ruled out: Secondary | ICD-10-CM

## 2011-11-23 DIAGNOSIS — C189 Malignant neoplasm of colon, unspecified: Secondary | ICD-10-CM

## 2011-11-23 DIAGNOSIS — Z09 Encounter for follow-up examination after completed treatment for conditions other than malignant neoplasm: Secondary | ICD-10-CM | POA: Insufficient documentation

## 2011-11-23 MED ORDER — IOHEXOL 300 MG/ML  SOLN
100.0000 mL | Freq: Once | INTRAMUSCULAR | Status: AC | PRN
  Administered 2011-11-23: 100 mL via INTRAVENOUS

## 2011-12-04 ENCOUNTER — Telehealth: Payer: Self-pay | Admitting: *Deleted

## 2011-12-04 NOTE — Telephone Encounter (Signed)
Message copied by Anselm Jungling on Mon Dec 04, 2011  9:59 AM ------      Message from: Josph Macho      Created: Sun Nov 26, 2011  8:27 PM       Call -  No obvious cancer.  pete

## 2011-12-04 NOTE — Telephone Encounter (Signed)
Called patients personal voicemail to let her know that her CT scan did not show any obvious cancer per dr. Myna Hidalgo.

## 2011-12-22 ENCOUNTER — Ambulatory Visit (HOSPITAL_BASED_OUTPATIENT_CLINIC_OR_DEPARTMENT_OTHER): Payer: Medicare Other | Admitting: Hematology & Oncology

## 2011-12-22 ENCOUNTER — Other Ambulatory Visit (HOSPITAL_BASED_OUTPATIENT_CLINIC_OR_DEPARTMENT_OTHER): Payer: Medicare Other | Admitting: Lab

## 2011-12-22 VITALS — BP 157/64 | HR 66 | Temp 97.3°F | Ht 62.0 in | Wt 168.0 lb

## 2011-12-22 DIAGNOSIS — C186 Malignant neoplasm of descending colon: Secondary | ICD-10-CM

## 2011-12-22 DIAGNOSIS — C189 Malignant neoplasm of colon, unspecified: Secondary | ICD-10-CM

## 2011-12-22 LAB — COMPREHENSIVE METABOLIC PANEL
ALT: 38 U/L — ABNORMAL HIGH (ref 0–35)
AST: 33 U/L (ref 0–37)
Alkaline Phosphatase: 51 U/L (ref 39–117)
Sodium: 140 mEq/L (ref 135–145)
Total Bilirubin: 0.6 mg/dL (ref 0.3–1.2)
Total Protein: 6.7 g/dL (ref 6.0–8.3)

## 2011-12-22 LAB — CBC WITH DIFFERENTIAL (CANCER CENTER ONLY)
BASO#: 0 10*3/uL (ref 0.0–0.2)
EOS%: 3.7 % (ref 0.0–7.0)
Eosinophils Absolute: 0.2 10*3/uL (ref 0.0–0.5)
HGB: 12.7 g/dL (ref 11.6–15.9)
LYMPH#: 2.2 10*3/uL (ref 0.9–3.3)
MONO#: 0.6 10*3/uL (ref 0.1–0.9)
NEUT#: 2.8 10*3/uL (ref 1.5–6.5)
RBC: 4.17 10*6/uL (ref 3.70–5.32)
WBC: 5.9 10*3/uL (ref 3.9–10.0)

## 2011-12-22 NOTE — Progress Notes (Signed)
This office note has been dictated.

## 2011-12-23 NOTE — Progress Notes (Signed)
CC:   Robin [name], Robin.D. Robin [name]. Robin [name], Robin.D. Robin [name], Robin.D.  DIAGNOSIS:  Locally recurrent adenocarcinoma of the colon.  CURRENT THERAPY:  Observation.  INTERIM HISTORY:  Robin [name] comes in for her follow-up.  She is doing quite well.  She has had no complaints since we last saw her.  We last saw her back in late January.  She had her last CEA back in January that was 2.1.  She has had no abdominal pain.  There has been no change in bowel or bladder habits.  She has had no cough.  There has been no leg swelling. She has had no rashes.  PHYSICAL EXAMINATION:  General Appearance:  This is a well-developed, well-nourished white female in no obvious distress.  Vital Signs:  97.3, pulse 66, respiratory rate 18, blood pressure 157/64.  Weight is 168. Head and Neck Exam:  Shows a normocephalic, atraumatic skull.  There are no ocular or oral lesions.  There are no palpable cervical or supraclavicular lymph nodes.  Lungs:  Clear bilaterally.  Cardiac Exam: Regular rate and rhythm with a normal S1 and S2.  There are no murmurs, rubs or bruits.  Abdominal Exam:  Soft with good bowel sounds.  There is no palpable abdominal mass.  There is no fluid wave.  There is no palpable hepatosplenomegaly.  She has a well-healed laparotomy scar. Extremities:  Show no clubbing, cyanosis or edema.  Neurological Exam: Shows no focal neurological deficits.  LABORATORY STUDIES:  White cell count is 5.9, hemoglobin 12.7, hematocrit 36.8, platelet count is 157.  IMPRESSION:  Robin [name] is a very nice 71 year old white female with a history of locally recurrent adenocarcinoma of the colon.  Her last CT scan was done back in February of 2013.  Everything looked fine with this.  It has now been over 2-1/2 years since she had adjuvant chemotherapy. She completed this back in September 2010.  For now, we will just follow her along supportively.  We will plan to get her back in 4 months or so.  I  do not see a need for any additional scans on her.   ______________________________ Josph Macho, Robin.D. PRE/MEDQ  D:  12/22/2011  T:  12/23/2011  Job:  [id number]

## 2012-02-08 ENCOUNTER — Other Ambulatory Visit: Payer: Self-pay | Admitting: Dermatology

## 2012-05-06 ENCOUNTER — Other Ambulatory Visit (HOSPITAL_BASED_OUTPATIENT_CLINIC_OR_DEPARTMENT_OTHER): Payer: Medicare Other | Admitting: Lab

## 2012-05-06 ENCOUNTER — Ambulatory Visit (HOSPITAL_BASED_OUTPATIENT_CLINIC_OR_DEPARTMENT_OTHER): Payer: Medicare Other | Admitting: Hematology & Oncology

## 2012-05-06 VITALS — BP 151/75 | HR 61 | Temp 97.9°F | Resp 18 | Ht 62.0 in | Wt 168.0 lb

## 2012-05-06 DIAGNOSIS — C186 Malignant neoplasm of descending colon: Secondary | ICD-10-CM

## 2012-05-06 DIAGNOSIS — G5690 Unspecified mononeuropathy of unspecified upper limb: Secondary | ICD-10-CM

## 2012-05-06 DIAGNOSIS — R209 Unspecified disturbances of skin sensation: Secondary | ICD-10-CM

## 2012-05-06 DIAGNOSIS — C189 Malignant neoplasm of colon, unspecified: Secondary | ICD-10-CM

## 2012-05-06 LAB — CBC WITH DIFFERENTIAL (CANCER CENTER ONLY)
BASO#: 0 10*3/uL (ref 0.0–0.2)
BASO%: 0.2 % (ref 0.0–2.0)
EOS%: 4 % (ref 0.0–7.0)
HCT: 36.5 % (ref 34.8–46.6)
HGB: 12.6 g/dL (ref 11.6–15.9)
LYMPH%: 37.5 % (ref 14.0–48.0)
MCHC: 34.5 g/dL (ref 32.0–36.0)
MCV: 90 fL (ref 81–101)
NEUT%: 48.3 % (ref 39.6–80.0)
RDW: 12.7 % (ref 11.1–15.7)

## 2012-05-06 LAB — COMPREHENSIVE METABOLIC PANEL
ALT: 29 U/L (ref 0–35)
AST: 22 U/L (ref 0–37)
BUN: 15 mg/dL (ref 6–23)
CO2: 29 mEq/L (ref 19–32)
Calcium: 10.8 mg/dL — ABNORMAL HIGH (ref 8.4–10.5)
Creatinine, Ser: 0.97 mg/dL (ref 0.50–1.10)
Total Bilirubin: 0.5 mg/dL (ref 0.3–1.2)

## 2012-05-06 NOTE — Progress Notes (Signed)
This office note has been dictated.

## 2012-05-07 NOTE — Progress Notes (Signed)
CC:   Theressa Millard, M.D. Angelia Mould. Derrell Lolling, M.D. Danise Edge, M.D.  DIAGNOSIS:  Locally recurrent adenocarcinoma of the colon.  CURRENT THERAPY:  Observation.  INTERIM HISTORY:  Mr. Gavina comes in for followup.  She is doing quite well.  She has had no complaints since we last saw her.  She started back to work at [employer].  She has had no abdominal pain.  There has been no nausea or vomiting. There has been no cough or shortness of breath.  Her last CEA level was 1.6 back in March.  She does have arthritis.  She does complain of some numbness in her hands when she does activities.  This sounds almost like carpal tunnel. I did recommend that she go see her family doctor for this.  She does not have any kind of numbness or tingling in her feet.  I told her that she could take nonsteroidals, (i.e. Advil or Aleve) with food, if she has problems with arthritic pain.  She has had no problems with her legs.  There has been no leg swelling. There has been no rashes. She is due for a mammogram in a couple months.  PHYSICAL EXAMINATION:  GENERAL:  This is a well-developed, well- nourished, white female in no obvious distress.  Vital signs: Temperature of 97.9, pulse 61, respiratory rate 18, blood pressure 151/75.  Weight is 168.  Head and neck:  Normocephalic, atraumatic skull.  There are no ocular or oral lesions.  There are no palpable cervical or supraclavicular lymph nodes.  Lungs:  Clear bilaterally. Cardiac:  Regular rate and rhythm with a normal S1, S2.  There are no murmurs, rubs or bruits.  Abdomen:  Soft with good bowel sounds.  There is no palpable abdominal mass.  There is no fluid wave.  No palpable hepatosplenomegaly is noted.  She has well-healed laparotomy scar. Extremities:  Osteoarthritic changes in her joints, in her hands particularly.  She has decent range decent range of motion of her joints.  She has no weakness in her hands.  There is no swelling in her feet.   She has good pulses in her distal extremities.  Skin:  No rashes, ecchymosis or petechiae.  LABORATORY STUDIES:  White cell count is 5.5, hemoglobin 12.6, hematocrit 36.5, platelet count 152.  IMPRESSION:  Mr. Krehbiel is a 71 year old white female with a past history of locally recurrent adenocarcinoma of the colon.  She now is out about 3 years since completion of her adjuvant chemotherapy. This was completed back in September 2010.  Again, this neuropathy in her hands appears to be more so related to arthritis and possibly to carpal tunnel.  We will see about referring to a hand specialist.  This hopefully will be able to help her out.  From my point of view, we do not have to see her back for 4 months.    ______________________________ Josph Macho, M.D. PRE/MEDQ  D:  05/06/2012  T:  05/07/2012  Job:  [id number]

## 2012-05-08 ENCOUNTER — Telehealth: Payer: Self-pay | Admitting: *Deleted

## 2012-05-08 NOTE — Telephone Encounter (Signed)
Called patient to let her know that her labs were all great per dr. ennever 

## 2012-05-08 NOTE — Telephone Encounter (Signed)
Message copied by Anselm Jungling on Wed May 08, 2012  1:05 PM ------      Message from: Josph Macho      Created: Tue May 07, 2012  8:42 PM       Call - labs are great!!!  Robin [name]

## 2012-05-22 ENCOUNTER — Other Ambulatory Visit: Payer: Self-pay | Admitting: Internal Medicine

## 2012-05-22 DIAGNOSIS — Z1231 Encounter for screening mammogram for malignant neoplasm of breast: Secondary | ICD-10-CM

## 2012-06-24 ENCOUNTER — Ambulatory Visit
Admission: RE | Admit: 2012-06-24 | Discharge: 2012-06-24 | Disposition: A | Discharge status: Home / Self Care | Payer: Medicare Other | Source: Ambulatory Visit | Attending: Internal Medicine | Admitting: Internal Medicine

## 2012-06-24 DIAGNOSIS — Z1231 Encounter for screening mammogram for malignant neoplasm of breast: Secondary | ICD-10-CM

## 2012-09-04 ENCOUNTER — Ambulatory Visit: Payer: Medicare Other | Admitting: Hematology & Oncology

## 2012-09-04 ENCOUNTER — Other Ambulatory Visit: Payer: Medicare Other | Admitting: Lab

## 2012-09-16 ENCOUNTER — Encounter: Payer: Self-pay | Admitting: *Deleted

## 2012-09-16 ENCOUNTER — Ambulatory Visit (HOSPITAL_BASED_OUTPATIENT_CLINIC_OR_DEPARTMENT_OTHER): Payer: Medicare Other | Admitting: Hematology & Oncology

## 2012-09-16 ENCOUNTER — Other Ambulatory Visit (HOSPITAL_BASED_OUTPATIENT_CLINIC_OR_DEPARTMENT_OTHER): Payer: BC Managed Care – PPO | Admitting: Lab

## 2012-09-16 VITALS — BP 146/59 | HR 61 | Temp 98.8°F | Resp 16 | Ht 62.0 in | Wt 165.0 lb

## 2012-09-16 DIAGNOSIS — C189 Malignant neoplasm of colon, unspecified: Secondary | ICD-10-CM

## 2012-09-16 DIAGNOSIS — C186 Malignant neoplasm of descending colon: Secondary | ICD-10-CM

## 2012-09-16 LAB — CBC WITH DIFFERENTIAL (CANCER CENTER ONLY)
BASO#: 0 10*3/uL (ref 0.0–0.2)
EOS%: 5.1 % (ref 0.0–7.0)
Eosinophils Absolute: 0.3 10*3/uL (ref 0.0–0.5)
HGB: 12.8 g/dL (ref 11.6–15.9)
LYMPH#: 2.6 10*3/uL (ref 0.9–3.3)
MCH: 30.5 pg (ref 26.0–34.0)
MONO%: 10.2 % (ref 0.0–13.0)
NEUT#: 3.1 10*3/uL (ref 1.5–6.5)
Platelets: 171 10*3/uL (ref 145–400)
RBC: 4.19 10*6/uL (ref 3.70–5.32)

## 2012-09-16 LAB — COMPREHENSIVE METABOLIC PANEL
AST: 24 U/L (ref 0–37)
Albumin: 4.6 g/dL (ref 3.5–5.2)
Alkaline Phosphatase: 44 U/L (ref 39–117)
BUN: 14 mg/dL (ref 6–23)
Calcium: 10.3 mg/dL (ref 8.4–10.5)
Chloride: 103 mEq/L (ref 96–112)
Glucose, Bld: 69 mg/dL — ABNORMAL LOW (ref 70–99)
Potassium: 4.1 mEq/L (ref 3.5–5.3)
Sodium: 141 mEq/L (ref 135–145)
Total Protein: 6.8 g/dL (ref 6.0–8.3)

## 2012-09-16 LAB — CEA: CEA: 1.4 ng/mL (ref 0.0–5.0)

## 2012-09-16 NOTE — Progress Notes (Signed)
Letter sent to patient with her labs per her request.

## 2012-09-16 NOTE — Progress Notes (Signed)
This office note has been dictated.

## 2012-09-17 ENCOUNTER — Encounter: Payer: Self-pay | Admitting: *Deleted

## 2012-09-17 NOTE — Progress Notes (Signed)
CC:   Robin [name], M.D. Robin [name]. Robin [name], M.D. Robin [name], M.D.  DIAGNOSIS:  Locally recurrent adenocarcinoma of the colon.  CURRENT THERAPY:  Observation.  INTERIM HISTORY:  Robin [name] comes in for followup.  We see her every 4- 6 months.  She has been doing well since we last saw her.  She is still looking for a job.  She was working for [employer].  The patient did have a mammogram done in October.  She reports everything was okay.  The mammogram actually was September 30th.  Her last CEA when we saw her back in August was 1.7 and holding steady.  She has a little diarrhea.  She does take a probiotic.  Hopefully, this will help her a little bit more.  She has had no cough or shortness of breath.  She has had no wheezing. There has been no leg swelling.  There has been no bleeding.  She is on Synthroid for hypothyroidism.  PHYSICAL EXAMINATION:  This is a well-developed, well-nourished white female in no obvious distress.  Vital signs:  Temperature of 98.8, pulse 61, respiratory rate 16, blood pressure 146/59.  Weight is 165.  Head and neck:  Normocephalic, atraumatic skull.  There are no ocular or oral lesions.  There are no palpable cervical or supraclavicular lymph nodes. Lungs:  Clear bilaterally.  Cardiac:  Regular rate and rhythm with a normal S1 and S2.  There are no murmurs, rubs or bruits.  Abdomen:  Soft with good bowel sounds.  There is no palpable abdominal mass.  There is no palpable hepatosplenomegaly appreciated.  She has well-healed laparotomy scars.  Back:  No tenderness over the spine, ribs, or hips. Extremities:  No clubbing, cyanosis or edema.  Neurologic:  No focal neurological deficits.  LABORATORY STUDIES:  White cell count 6.7, hemoglobin 12.8, hematocrit 37.2, platelet count 171.  IMPRESSION:  Robin [name] is a very nice 71 year old white female who certainly looks a lot younger.  She has history of locally recurrent colon cancer.  She underwent  surgery followed by adjuvant chemotherapy. Adjuvant chemotherapy was completed back in September 2010.  I think we can probably get her back now in 6 months.    ______________________________ Josph Macho, M.D. PRE/MEDQ  D:  09/16/2012  T:  09/17/2012  Job:  [id number]

## 2013-02-20 ENCOUNTER — Other Ambulatory Visit: Payer: Self-pay | Admitting: Dermatology

## 2013-03-17 ENCOUNTER — Other Ambulatory Visit (HOSPITAL_BASED_OUTPATIENT_CLINIC_OR_DEPARTMENT_OTHER): Payer: Medicare (Managed Care) | Admitting: Lab

## 2013-03-17 ENCOUNTER — Ambulatory Visit (HOSPITAL_BASED_OUTPATIENT_CLINIC_OR_DEPARTMENT_OTHER): Payer: Medicare Other | Admitting: Medical

## 2013-03-17 VITALS — BP 145/63 | HR 66 | Temp 98.5°F | Resp 16 | Ht 62.0 in | Wt 167.0 lb

## 2013-03-17 DIAGNOSIS — C189 Malignant neoplasm of colon, unspecified: Secondary | ICD-10-CM

## 2013-03-17 LAB — CBC WITH DIFFERENTIAL (CANCER CENTER ONLY)
BASO#: 0 10*3/uL (ref 0.0–0.2)
BASO%: 0.3 % (ref 0.0–2.0)
EOS%: 3.2 % (ref 0.0–7.0)
HGB: 11.9 g/dL (ref 11.6–15.9)
LYMPH#: 2.2 10*3/uL (ref 0.9–3.3)
MCHC: 33.8 g/dL (ref 32.0–36.0)
MONO#: 0.7 10*3/uL (ref 0.1–0.9)
NEUT#: 3.1 10*3/uL (ref 1.5–6.5)
RDW: 12.9 % (ref 11.1–15.7)
WBC: 6.3 10*3/uL (ref 3.9–10.0)

## 2013-03-17 LAB — COMPREHENSIVE METABOLIC PANEL
ALT: 38 U/L — ABNORMAL HIGH (ref 0–35)
AST: 28 U/L (ref 0–37)
Albumin: 4.1 g/dL (ref 3.5–5.2)
Calcium: 9.9 mg/dL (ref 8.4–10.5)
Chloride: 105 mEq/L (ref 96–112)
Potassium: 3.7 mEq/L (ref 3.5–5.3)
Total Protein: 6.8 g/dL (ref 6.0–8.3)

## 2013-03-17 LAB — CEA: CEA: 2.1 ng/mL (ref 0.0–5.0)

## 2013-03-17 NOTE — Progress Notes (Signed)
DIAGNOSIS:  Locally recurrent adenocarcinoma of the colon.  CURRENT THERAPY:  Observation.  INTERIM HISTORY:   Robin [name] presents today for an office followup visit.  Overall she reports that she's been doing quite well.  Her next colonoscopy is due in July.  She's not reported any new issues or complications.  Her last CEA back in December was 1.4.  She's not reported any changes in her bowel or bladder habits.  She's not reported any type of rectal bleeding.  She's not reported any melena or hematochezia.  She has a good appetite.  She denies any unintentional weight loss.  She denies any nausea, vomiting, diarrhea or constipation.  She denies any fevers, chills or night sweats.  She denies any chest pain, shortness breath or cough.  She denies any abdominal pain.  She denies any obvious or abnormal bleeding.  She denies any lower leg swelling.  She denies any rashes, headaches or visual changes.  Of note, her last bilateral screening mammogram was done back in September which was benign.  Review of Systems: Constitutional:Negative for malaise/fatigue, fever, chills, weight loss, diaphoresis, activity change, appetite change, and unexpected weight change.  HEENT: Negative for double vision, blurred vision, visual loss, ear pain, tinnitus, congestion, rhinorrhea, epistaxis sore throat or sinus disease, oral pain/lesion, tongue soreness Respiratory: Negative for cough, chest tightness, shortness of breath, wheezing and stridor.  Cardiovascular: Negative for chest pain, palpitations, leg swelling, orthopnea, PND, DOE or claudication Gastrointestinal: Negative for nausea, vomiting, abdominal pain, diarrhea, constipation, blood in stool, melena, hematochezia, abdominal distention, anal bleeding, rectal pain, anorexia and hematemesis.  Genitourinary: Negative for dysuria, frequency, hematuria,  Musculoskeletal: Negative for myalgias, back pain, joint swelling, arthralgias and gait problem.  Skin: Negative  for rash, color change, pallor and wound.  Neurological:. Negative for dizziness/light-headedness, tremors, seizures, syncope, facial asymmetry, speech difficulty, weakness, numbness, headaches and paresthesias.  Hematological: Negative for adenopathy. Does not bruise/bleed easily.  Psychiatric/Behavioral:  Negative for depression, no loss of interest in normal activity or change in sleep pattern.   Physical Exam: This is a pleasant 72 year old well-developed well-nourished white female in no obvious distress Vitals: Temperature 90.5 degrees pulse 66 respirations 16 blood pressure 145/63 weight 167 pounds HEENT reveals a normocephalic, atraumatic skull, no scleral icterus, no oral lesions  Neck is supple without any cervical or supraclavicular adenopathy.  Lungs are clear to auscultation bilaterally. There are no wheezes, rales or rhonci Cardiac is regular rate and rhythm with a normal S1 and S2. There are no murmurs, rubs, or bruits.  Abdomen is soft with good bowel sounds, there is no palpable mass. There is no palpable hepatosplenomegaly. There is no palpable fluid wave.  Musculoskeletal no tenderness of the spine, ribs, or hips.  Extremities there are no clubbing, cyanosis, or edema.  Skin no petechia, purpura or ecchymosis Neurologic is nonfocal.  Laboratory Data: White count 6.3 hemoglobin 11.9 hematocrit 35.2 platelets 181,000  Current Outpatient Prescriptions on File Prior to Visit  Medication Sig Dispense Refill  . amLODipine (NORVASC) 5 MG tablet Take 5 mg by mouth Daily.      Marland Kitchen aspirin 81 MG tablet Take 81 mg by mouth daily.      . fish oil-omega-3 fatty acids 1000 MG capsule Take 2 g by mouth daily.      Marland Kitchen FLUoxetine (PROZAC) 20 MG capsule Take 20 mg by mouth Daily.      Marland Kitchen glucosamine-chondroitin 500-400 MG tablet Take 1 tablet by mouth daily.      Marland Kitchen  Multiple Vitamin (MULTIVITAMIN) capsule Take 1 capsule by mouth daily.      . Probiotic Product (CHILDRENS PROBITIC PO) Take by  mouth.      . SYNTHROID 100 MCG tablet Take 100 mcg by mouth Daily.      Marland Kitchen triamterene-hydrochlorothiazide (DYAZIDE) 37.5-25 MG per capsule Take by mouth Daily. Dose is 37.5-25      . vitamin E (VITAMIN E) 1000 UNIT capsule Take 1,000 Units by mouth daily.       No current facility-administered medications on file prior to visit.   Assessment/Plan: Robin [name] is a pleasant 72 year old female with the following issues:  #1.  History of locally recurrent colon cancer.  She underwent surgery followed by adjuvant chemotherapy.  Adjuvent chemotherapy was completed back in September 2010.  There is no indication for any type of recurrence on today's exam.  There is no need for any scans at this time.  She's due for her next colonoscopy in July.  #2.  Followup.  Robin [name] will follow back up with [place] in 6 months but before then should there be questions or concerns.

## 2013-03-18 ENCOUNTER — Telehealth: Payer: Self-pay | Admitting: *Deleted

## 2013-03-18 NOTE — Telephone Encounter (Signed)
Message copied by Anselm Jungling on Tue Mar 18, 2013 10:44 AM ------      Message from: Arlan Organ R      Created: Mon Mar 17, 2013  8:30 PM       Call - labs are stable!!  Cindee Lame ------

## 2013-03-18 NOTE — Telephone Encounter (Signed)
Called patient to let her know that her labwork was stable per dr. Lupita Leash.  Left message on patients personal cell phone answering machine.

## 2013-05-30 ENCOUNTER — Other Ambulatory Visit: Payer: Self-pay

## 2013-05-30 DIAGNOSIS — Z1231 Encounter for screening mammogram for malignant neoplasm of breast: Secondary | ICD-10-CM

## 2013-06-25 ENCOUNTER — Ambulatory Visit
Admission: RE | Admit: 2013-06-25 | Discharge: 2013-06-25 | Disposition: A | Discharge status: Home / Self Care | Payer: Medicare Other | Source: Ambulatory Visit

## 2013-06-25 DIAGNOSIS — Z1231 Encounter for screening mammogram for malignant neoplasm of breast: Secondary | ICD-10-CM

## 2013-09-12 ENCOUNTER — Telehealth: Payer: Self-pay | Admitting: Hematology & Oncology

## 2013-09-12 NOTE — Telephone Encounter (Signed)
Pt cx 12-22 rescheduled for january

## 2013-09-12 NOTE — Telephone Encounter (Signed)
Pt left message to cx 12-22 appointment said have PCP on 12-30 and that the lab work would be drawn to close together. JoEllen RN aware and will check with MD if that's ok to reschedule

## 2013-09-15 ENCOUNTER — Other Ambulatory Visit: Payer: Medicare Other | Admitting: Lab

## 2013-09-15 ENCOUNTER — Ambulatory Visit: Payer: Medicare Other | Admitting: Hematology & Oncology

## 2013-09-23 ENCOUNTER — Other Ambulatory Visit: Payer: Self-pay | Admitting: Internal Medicine

## 2013-09-23 DIAGNOSIS — R109 Unspecified abdominal pain: Secondary | ICD-10-CM

## 2013-09-26 ENCOUNTER — Ambulatory Visit
Admission: RE | Admit: 2013-09-26 | Discharge: 2013-09-26 | Disposition: A | Discharge status: Home / Self Care | Payer: Medicare Other | Source: Ambulatory Visit | Attending: Internal Medicine | Admitting: Internal Medicine

## 2013-09-26 DIAGNOSIS — R109 Unspecified abdominal pain: Secondary | ICD-10-CM

## 2013-10-23 ENCOUNTER — Encounter: Payer: Self-pay | Admitting: Hematology & Oncology

## 2013-10-23 ENCOUNTER — Other Ambulatory Visit (HOSPITAL_BASED_OUTPATIENT_CLINIC_OR_DEPARTMENT_OTHER): Payer: Medicare Other | Admitting: Lab

## 2013-10-23 ENCOUNTER — Ambulatory Visit (HOSPITAL_BASED_OUTPATIENT_CLINIC_OR_DEPARTMENT_OTHER): Payer: Medicare Other | Admitting: Hematology & Oncology

## 2013-10-23 VITALS — BP 153/65 | HR 72 | Temp 98.4°F | Resp 14 | Ht 63.0 in | Wt 168.0 lb

## 2013-10-23 DIAGNOSIS — C189 Malignant neoplasm of colon, unspecified: Secondary | ICD-10-CM

## 2013-10-23 DIAGNOSIS — Z85038 Personal history of other malignant neoplasm of large intestine: Secondary | ICD-10-CM

## 2013-10-23 DIAGNOSIS — K219 Gastro-esophageal reflux disease without esophagitis: Secondary | ICD-10-CM

## 2013-10-23 DIAGNOSIS — K7689 Other specified diseases of liver: Secondary | ICD-10-CM

## 2013-10-23 LAB — COMPREHENSIVE METABOLIC PANEL
ALBUMIN: 4.4 g/dL (ref 3.5–5.2)
ALK PHOS: 49 U/L (ref 39–117)
ALT: 42 U/L — AB (ref 0–35)
AST: 33 U/L (ref 0–37)
BUN: 17 mg/dL (ref 6–23)
CALCIUM: 10.2 mg/dL (ref 8.4–10.5)
CHLORIDE: 99 meq/L (ref 96–112)
CO2: 26 mEq/L (ref 19–32)
Creatinine, Ser: 1.02 mg/dL (ref 0.50–1.10)
Glucose, Bld: 166 mg/dL — ABNORMAL HIGH (ref 70–99)
POTASSIUM: 3.4 meq/L — AB (ref 3.5–5.3)
SODIUM: 137 meq/L (ref 135–145)
TOTAL PROTEIN: 6.8 g/dL (ref 6.0–8.3)
Total Bilirubin: 0.5 mg/dL (ref 0.2–1.2)

## 2013-10-23 LAB — CBC WITH DIFFERENTIAL (CANCER CENTER ONLY)
BASO#: 0 10*3/uL (ref 0.0–0.2)
BASO%: 0.3 % (ref 0.0–2.0)
EOS%: 3 % (ref 0.0–7.0)
Eosinophils Absolute: 0.2 10*3/uL (ref 0.0–0.5)
HCT: 35.5 % (ref 34.8–46.6)
HEMOGLOBIN: 11.9 g/dL (ref 11.6–15.9)
LYMPH#: 2.2 10*3/uL (ref 0.9–3.3)
LYMPH%: 33.8 % (ref 14.0–48.0)
MCH: 29.5 pg (ref 26.0–34.0)
MCHC: 33.5 g/dL (ref 32.0–36.0)
MCV: 88 fL (ref 81–101)
MONO#: 0.5 10*3/uL (ref 0.1–0.9)
MONO%: 6.9 % (ref 0.0–13.0)
NEUT%: 56 % (ref 39.6–80.0)
NEUTROS ABS: 3.7 10*3/uL (ref 1.5–6.5)
PLATELETS: 174 10*3/uL (ref 145–400)
RBC: 4.04 10*6/uL (ref 3.70–5.32)
RDW: 12.8 % (ref 11.1–15.7)
WBC: 6.6 10*3/uL (ref 3.9–10.0)

## 2013-10-23 LAB — CEA: CEA: 1.6 ng/mL (ref 0.0–5.0)

## 2013-10-23 NOTE — Progress Notes (Signed)
This office note has been dictated.

## 2013-10-24 ENCOUNTER — Telehealth: Payer: Self-pay | Admitting: Hematology & Oncology

## 2013-10-24 NOTE — Telephone Encounter (Signed)
Mailed July schedule

## 2013-10-24 NOTE — Progress Notes (Signed)
CC:   Nehemiah Settle, M.D.  DIAGNOSIS:  History of locally recurrent adenocarcinoma of the colon.  CURRENT THERAPY:  Observation.  INTERIM HISTORY:  Robin [name] comes in for followup.  We last saw her back in June.  We see her every 6 months.  She had been having abdominal issues.  She had an ultrasound done, I think, a couple of weeks ago.  This did not show any gallbladder problems.  It did show that she had a fatty liver.  There is no obvious evidence of any kind of cancer recurrence.  Of note, she has had problems with "reflux."  She has a hiatal hernia. She thinks that the hernia is causing her problems.  She has had no cough.  She has had no fever.  She has had no obvious bleeding.  She has had no rashes.  There has been no leg swelling.  There has been no change in her medications.  PHYSICAL EXAMINATION:  General:  This is a well-developed, well- nourished white female in no obvious distress.  Vital Signs: Temperature of 98.4, pulse 72, respiratory rate 14, blood pressure 153/65, weight is 168 pounds.  Head and Neck:  Normocephalic, atraumatic skull.  There are no ocular or oral lesions.  There are no palpable cervical or supraclavicular lymph nodes.  Lungs:  Clear bilaterally. Cardiac:  Regular rate and rhythm with normal S1 and S2.  There are no murmurs, rubs, or bruits.  Abdomen:  Soft.  She has decent bowel sounds. She has a well-healed laparotomy scar.  There may be some slight tenderness in the epigastric region.  There is no palpable hepatosplenomegaly.  Back:  No tenderness over the spine, ribs, or hips. Extremities:  No clubbing, cyanosis, or edema.  Neurologic:  No focal neurological deficits.  LABORATORY STUDIES:  White cell count is 6.6, hemoglobin 12, hematocrit 35.5, platelet count 174.  IMPRESSION:  Robin [name] is a very charming 73 year old white female. She has a history of locally recurrent adenocarcinoma of the colon.  She has been free of disease for  5 years.  She had adjuvant chemotherapy after having the recurrence resected.  She, in my opinion, does not have any issues with cancer and I think that she is probably cured.  I told Robin [name] to stop taking her baby aspirin for 6 weeks.  I told her to take over-the-counter Pepcid twice a day for 3 weeks.  If she is no better after 6 weeks, she needs to see a gastroenterologist.  She may need to have an upper GI done.  I told her to call me with this.  Otherwise, I will plan to see her back in another 6 months.  Of note, her last mammogram was done, I think, back in October 2014. Exam looked fine.    ______________________________ [name], M.D. PRE/MEDQ  D:  10/23/2013  T:  10/24/2013  Job:  [id number]

## 2013-10-28 ENCOUNTER — Telehealth: Payer: Self-pay | Admitting: Hematology & Oncology

## 2013-10-28 NOTE — Telephone Encounter (Signed)
Patient called and cx 04/23/14 apt and resch for 04/30/14

## 2013-12-26 ENCOUNTER — Other Ambulatory Visit: Payer: Self-pay | Admitting: Dermatology

## 2014-04-23 ENCOUNTER — Ambulatory Visit: Payer: Medicare Other | Admitting: Hematology & Oncology

## 2014-04-23 ENCOUNTER — Other Ambulatory Visit: Payer: Medicare Other | Admitting: Lab

## 2014-04-30 ENCOUNTER — Other Ambulatory Visit (HOSPITAL_BASED_OUTPATIENT_CLINIC_OR_DEPARTMENT_OTHER): Payer: Medicare Other | Admitting: Lab

## 2014-04-30 ENCOUNTER — Telehealth: Payer: Self-pay | Admitting: Hematology & Oncology

## 2014-04-30 ENCOUNTER — Encounter: Payer: Self-pay | Admitting: Family

## 2014-04-30 ENCOUNTER — Ambulatory Visit (HOSPITAL_BASED_OUTPATIENT_CLINIC_OR_DEPARTMENT_OTHER): Payer: Medicare Other | Admitting: Family

## 2014-04-30 VITALS — BP 122/67 | HR 62 | Temp 98.1°F | Resp 14 | Ht 63.0 in | Wt 166.0 lb

## 2014-04-30 DIAGNOSIS — C189 Malignant neoplasm of colon, unspecified: Secondary | ICD-10-CM

## 2014-04-30 LAB — COMPREHENSIVE METABOLIC PANEL
ALT: 48 U/L — ABNORMAL HIGH (ref 0–35)
AST: 47 U/L — ABNORMAL HIGH (ref 0–37)
Albumin: 4.5 g/dL (ref 3.5–5.2)
Alkaline Phosphatase: 54 U/L (ref 39–117)
BUN: 15 mg/dL (ref 6–23)
CALCIUM: 10.3 mg/dL (ref 8.4–10.5)
CO2: 28 meq/L (ref 19–32)
CREATININE: 1.07 mg/dL (ref 0.50–1.10)
Chloride: 103 mEq/L (ref 96–112)
GLUCOSE: 130 mg/dL — AB (ref 70–99)
Potassium: 3.6 mEq/L (ref 3.5–5.3)
SODIUM: 139 meq/L (ref 135–145)
TOTAL PROTEIN: 6.9 g/dL (ref 6.0–8.3)
Total Bilirubin: 0.4 mg/dL (ref 0.2–1.2)

## 2014-04-30 LAB — CBC WITH DIFFERENTIAL (CANCER CENTER ONLY)
BASO#: 0 10*3/uL (ref 0.0–0.2)
BASO%: 0.3 % (ref 0.0–2.0)
EOS ABS: 0.3 10*3/uL (ref 0.0–0.5)
EOS%: 4.4 % (ref 0.0–7.0)
HEMATOCRIT: 37 % (ref 34.8–46.6)
HEMOGLOBIN: 12.9 g/dL (ref 11.6–15.9)
LYMPH#: 2.2 10*3/uL (ref 0.9–3.3)
LYMPH%: 35.3 % (ref 14.0–48.0)
MCH: 30.9 pg (ref 26.0–34.0)
MCHC: 34.9 g/dL (ref 32.0–36.0)
MCV: 89 fL (ref 81–101)
MONO#: 0.5 10*3/uL (ref 0.1–0.9)
MONO%: 8.1 % (ref 0.0–13.0)
NEUT#: 3.2 10*3/uL (ref 1.5–6.5)
NEUT%: 51.9 % (ref 39.6–80.0)
Platelets: 188 10*3/uL (ref 145–400)
RBC: 4.17 10*6/uL (ref 3.70–5.32)
RDW: 12.7 % (ref 11.1–15.7)
WBC: 6.2 10*3/uL (ref 3.9–10.0)

## 2014-04-30 LAB — CEA: CEA: 2 ng/mL (ref 0.0–5.0)

## 2014-04-30 LAB — LACTATE DEHYDROGENASE: LDH: 186 U/L (ref 94–250)

## 2014-04-30 NOTE — Progress Notes (Signed)
[place]  [phone] [phone]  ID: Robin [name] OB: 29-Aug-1941 MR#: [id number]#:[id number] Patient Care Team: Horton Finer, MD as PCP - General (Internal Medicine)  DIAGNOSIS: History of locally recurrent adenocarcinoma of the colon  INTERVAL HISTORY: Robin [name] is here today for her 6 month followup. We last saw her back in January. She had an abdominal ultrasound in January that showed she had a fatty liver. There was no obvious evidence of any kind of cancer recurrence. Her reflux is better since her last visit. She denies fever, chills, n/v, cough, rash, headache, dizziness, SOB, chest pin, palpitations, abdominal pain, constipation, diarrhea, blood in urine or stool. She has not gained or lost a lot of weight. Her appetite is good. She denies any swelling, tenderness, numbness or tingling in her extremities. 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    CURRENT TREATMENT: Observation  REVIEW OF SYSTEMS: All other 10 point review of systems is negative.   PAST MEDICAL HISTORY: Past Medical History  Diagnosis Date  . Colon cancer     colon ca dx 2008 and 2010  . Hypertension    PAST SURGICAL HISTORY: No past surgical history on file.  FAMILY HISTORY No family history on file.  GYNECOLOGIC HISTORY:  No LMP recorded. Patient is postmenopausal.   SOCIAL HISTORY:  History   Social History  . Marital Status: Divorced    Spouse Name: N/A    Number of Children: N/A  . Years of Education: N/A   Occupational History  . Not on file.   Social History Main Topics  . Smoking status: Never Smoker   . Smokeless tobacco: Never Used     Comment: never used tobacco  . Alcohol Use: Not on file  . Drug Use: Not on file  . Sexual Activity: Not on file   Other Topics Concern  . Not on file   Social History Narrative  . No narrative on file   ADVANCED DIRECTIVES:  <no information>  HEALTH MAINTENANCE: History  Substance Use Topics  . Smoking status: Never Smoker   . Smokeless tobacco: Never Used     Comment: never used tobacco  . Alcohol Use: Not on file   Colonoscopy: PAP: Bone density: Lipid panel:  Allergies  Allergen Reactions  . Statins     Aches in her bones   Current Outpatient Prescriptions  Medication Sig Dispense Refill  . amLODipine (NORVASC) 5 MG tablet Take 5 mg by mouth Daily.      Marland Kitchen FLUoxetine (PROZAC) 20 MG capsule Take 20 mg by mouth Daily.      . fluticasone (FLONASE) 50 MCG/ACT nasal spray Place 1 spray into the nose as needed.       Marland Kitchen glucosamine-chondroitin 500-400 MG tablet Take 1 tablet by mouth daily.      . Multiple Vitamin (MULTIVITAMIN) capsule Take 1 capsule by mouth daily.      . Probiotic Product (PROBIOTIC DAILY) CAPS Take by mouth every morning.      Marland Kitchen SYNTHROID 100 MCG tablet Take 100 mcg by mouth Daily.      Marland Kitchen triamterene-hydrochlorothiazide (DYAZIDE) 37.5-25 MG per capsule Take by mouth Daily. Dose is 37.5-25      . vitamin E (VITAMIN E) 1000 UNIT capsule Take 1,000 Units by mouth daily.      Marland Kitchen aspirin 81 MG tablet Take 81 mg by mouth daily. ON HOLD  FOR LAST 8 MONTHS      . fish oil-omega-3 fatty acids 1000 MG capsule Take 2 g by mouth daily.       No current facility-administered medications for this visit.   OBJECTIVE: Filed Vitals:   04/30/14 1500  BP: 122/67  Pulse: 62  Temp: 98.1 F (36.7 C)  Resp: 14   Body mass index is 29.41 kg/(m^2). ECOG FS:0 - Asymptomatic Ocular: Sclerae unicteric, pupils equal, round and reactive to light Ear-nose-throat: Oropharynx clear, dentition fair Lymphatic: No cervical or supraclavicular adenopathy Lungs no rales or rhonchi, good excursion bilaterally Heart regular rate and rhythm, no murmur appreciated Abd soft, nontender, positive bowel sounds MSK no focal spinal tenderness, no joint edema Neuro: non-focal, well-oriented, appropriate affect Breasts:  Deferred  LAB RESULTS: CMP     Component Value Date/Time   NA 137 10/23/2013 1459   K 3.4* 10/23/2013 1459   CL 99 10/23/2013 1459   CO2 26 10/23/2013 1459   GLUCOSE 166* 10/23/2013 1459   BUN 17 10/23/2013 1459   CREATININE 1.02 10/23/2013 1459   CALCIUM 10.2 10/23/2013 1459   PROT 6.8 10/23/2013 1459   ALBUMIN 4.4 10/23/2013 1459   AST 33 10/23/2013 1459   ALT 42* 10/23/2013 1459   ALKPHOS 49 10/23/2013 1459   BILITOT 0.5 10/23/2013 1459   GFRNONAA >60 03/01/2010 2351   GFRAA  Value: >60        The eGFR has been calculated using the MDRD equation. This calculation has not been validated in all clinical situations. eGFR's persistently <60 mL/min signify possible Chronic Kidney Disease. 03/01/2010 2351   No results found for this basename: SPEP, UPEP,  kappa and lambda light chains   Lab Results  Component Value Date   WBC 6.2 04/30/2014   NEUTROABS 3.2 04/30/2014   HGB 12.9 04/30/2014   HCT 37.0 04/30/2014   MCV 89 04/30/2014   PLT 188 04/30/2014   No results found for this basename: LABCA2   No components found with this basename: GHWEX937   No results found for this basename: INR,  in the last 168 hours  STUDIES: No results found.  ASSESSMENT/PLAN: Robin [name] is a very charming 73 year old white female. She has a history of locally recurrent adenocarcinoma of the colon. She has been free of disease for 6 years. She had adjuvant chemotherapy after having the recurrence resected. There are no signs or symptoms of recurrent disease at this time. Overall, she is doing very well.  Her CBC was unremarkable.  We will plan to see her back in 6 months for labs and follow-up.  She is in agreement and knows to call here with any questions or concerns. We can certainly see her back sooner if need be.    Eliezer Bottom, NP 04/30/2014 3:49 PM

## 2014-04-30 NOTE — Telephone Encounter (Signed)
Mailed 10-2014 appointment

## 2014-05-01 ENCOUNTER — Telehealth: Payer: Self-pay | Admitting: *Deleted

## 2014-05-01 NOTE — Telephone Encounter (Addendum)
Message copied by Lenn Sink on Fri May 01, 2014  8:53 AM ------      Message from: Burney Gauze R      Created: Thu Apr 30, 2014 10:28 PM       Call - labs are ok.  Tumor marker is ok!!!  pete ------Left voicemail informing pt that labs are ok. Tumor marker is ok.

## 2014-06-05 ENCOUNTER — Other Ambulatory Visit: Payer: Self-pay

## 2014-06-05 DIAGNOSIS — Z1231 Encounter for screening mammogram for malignant neoplasm of breast: Secondary | ICD-10-CM

## 2014-07-20 ENCOUNTER — Ambulatory Visit
Admission: RE | Admit: 2014-07-20 | Discharge: 2014-07-20 | Disposition: A | Discharge status: Home / Self Care | Payer: Medicare Other | Source: Ambulatory Visit

## 2014-07-20 DIAGNOSIS — Z1231 Encounter for screening mammogram for malignant neoplasm of breast: Secondary | ICD-10-CM

## 2014-10-29 ENCOUNTER — Encounter: Payer: Self-pay | Admitting: Hematology & Oncology

## 2014-10-29 ENCOUNTER — Other Ambulatory Visit (HOSPITAL_BASED_OUTPATIENT_CLINIC_OR_DEPARTMENT_OTHER): Payer: 59 | Admitting: Lab

## 2014-10-29 ENCOUNTER — Telehealth: Payer: Self-pay | Admitting: Hematology & Oncology

## 2014-10-29 ENCOUNTER — Ambulatory Visit (HOSPITAL_BASED_OUTPATIENT_CLINIC_OR_DEPARTMENT_OTHER): Payer: 59 | Admitting: Hematology & Oncology

## 2014-10-29 VITALS — BP 139/60 | HR 68 | Temp 99.0°F | Resp 14 | Ht 63.0 in | Wt 172.0 lb

## 2014-10-29 DIAGNOSIS — Z85038 Personal history of other malignant neoplasm of large intestine: Secondary | ICD-10-CM

## 2014-10-29 DIAGNOSIS — C189 Malignant neoplasm of colon, unspecified: Secondary | ICD-10-CM

## 2014-10-29 LAB — CBC WITH DIFFERENTIAL (CANCER CENTER ONLY)
BASO#: 0 10*3/uL (ref 0.0–0.2)
BASO%: 0.5 % (ref 0.0–2.0)
EOS%: 4.5 % (ref 0.0–7.0)
Eosinophils Absolute: 0.3 10*3/uL (ref 0.0–0.5)
HCT: 36.7 % (ref 34.8–46.6)
HGB: 12.6 g/dL (ref 11.6–15.9)
LYMPH#: 2.4 10*3/uL (ref 0.9–3.3)
LYMPH%: 39.2 % (ref 14.0–48.0)
MCH: 30.7 pg (ref 26.0–34.0)
MCHC: 34.3 g/dL (ref 32.0–36.0)
MCV: 89 fL (ref 81–101)
MONO#: 0.6 10*3/uL (ref 0.1–0.9)
MONO%: 10.1 % (ref 0.0–13.0)
NEUT%: 45.7 % (ref 39.6–80.0)
NEUTROS ABS: 2.8 10*3/uL (ref 1.5–6.5)
PLATELETS: 175 10*3/uL (ref 145–400)
RBC: 4.11 10*6/uL (ref 3.70–5.32)
RDW: 12.6 % (ref 11.1–15.7)
WBC: 6.1 10*3/uL (ref 3.9–10.0)

## 2014-10-29 LAB — COMPREHENSIVE METABOLIC PANEL
ALK PHOS: 50 U/L (ref 39–117)
ALT: 58 U/L — ABNORMAL HIGH (ref 0–35)
AST: 55 U/L — ABNORMAL HIGH (ref 0–37)
Albumin: 4.2 g/dL (ref 3.5–5.2)
BILIRUBIN TOTAL: 0.6 mg/dL (ref 0.2–1.2)
BUN: 13 mg/dL (ref 6–23)
CALCIUM: 10.1 mg/dL (ref 8.4–10.5)
CO2: 25 meq/L (ref 19–32)
Chloride: 101 mEq/L (ref 96–112)
Creatinine, Ser: 0.95 mg/dL (ref 0.50–1.10)
Glucose, Bld: 197 mg/dL — ABNORMAL HIGH (ref 70–99)
Potassium: 3.6 mEq/L (ref 3.5–5.3)
SODIUM: 136 meq/L (ref 135–145)
Total Protein: 6.8 g/dL (ref 6.0–8.3)

## 2014-10-29 LAB — CEA: CEA: 2.5 ng/mL (ref 0.0–5.0)

## 2014-10-29 LAB — LACTATE DEHYDROGENASE: LDH: 170 U/L (ref 94–250)

## 2014-10-29 NOTE — Progress Notes (Signed)
Hematology and Oncology Follow Up Visit  GEORGIANN NEIDER [id number] Sep 27, 1940 74 y.o. 10/29/2014   Principle Diagnosis:  History of locally recurrent adenocarcinoma of the colon  Current Therapy:    Observation     Interim History:  Ms.  Godino is back for follow-up. We see her every 6 months. Since we last saw, she's been doing pretty well. She had no problems over the holidays.  She's had no change in bowel or bladder habits. There's been no abdominal pain. She's had no cough. She's had no leg swelling. She's had no rashes. She does have a new "spot was "on the forearm of her right arm. She will see her dermatologist in a couple weeks.  She's had no bleeding.  Her last mammogram was done back in August. Everything looked okay.  Her last CEA level done back in August 2015 was 2.0.  Medications:  Current outpatient prescriptions:  .  amLODipine (NORVASC) 5 MG tablet, Take 5 mg by mouth Daily., Disp: , Rfl:  .  fish oil-omega-3 fatty acids 1000 MG capsule, Take 2 g by mouth daily., Disp: , Rfl:  .  FLUoxetine (PROZAC) 20 MG capsule, Take 20 mg by mouth Daily., Disp: , Rfl:  .  fluticasone (FLONASE) 50 MCG/ACT nasal spray, Place 1 spray into the nose as needed. , Disp: , Rfl:  .  glucosamine-chondroitin 500-400 MG tablet, Take 1 tablet by mouth daily., Disp: , Rfl:  .  Multiple Vitamin (MULTIVITAMIN) capsule, Take 1 capsule by mouth daily., Disp: , Rfl:  .  Probiotic Product (PROBIOTIC DAILY) CAPS, Take by mouth every morning., Disp: , Rfl:  .  SYNTHROID 100 MCG tablet, Take 100 mcg by mouth Daily., Disp: , Rfl:  .  triamterene-hydrochlorothiazide (DYAZIDE) 37.5-25 MG per capsule, Take by mouth Daily. Dose is 37.5-25, Disp: , Rfl:  .  vitamin E (VITAMIN E) 1000 UNIT capsule, Take 1,000 Units by mouth daily., Disp: , Rfl:   Allergies:  Allergies  Allergen Reactions  . Statins     Aches in her bones    Past Medical History, Surgical history, Social history, and Family History  were reviewed and updated.  Review of Systems: As above  Physical Exam:  height is 5\' 3"  (1.6 m) and weight is 172 lb (78.019 kg). Her oral temperature is 99 F (37.2 C). Her blood pressure is 139/60 and her pulse is 68. Her respiration is 14.   Well-developed and well-nourished white female in no obvious distress. Head and neck exam shows no ocular or oral lesions. She has no adenopathy in the neck. Lungs are clear. Cardiac exam regular rate and rhythm with no murmurs, rubs or bruits. Abdomen is soft. She has good bowel sounds. She has well-healed laparoscopy laparotomy scars. She has no fluid wave. There is no palpable liver or spleen tip. Back exam shows no tenderness over the spine, ribs or hips. Externally shows no clubbing, cyanosis or edema. She does have osteoarthritic changes in her joints. Skin exam shows no suspicious rashes, ecchymoses or petechia. Neurological exam shows no focal neurological deficits.  Lab Results  Component Value Date   WBC 6.1 10/29/2014   HGB 12.6 10/29/2014   HCT 36.7 10/29/2014   MCV 89 10/29/2014   PLT 175 10/29/2014     Chemistry      Component Value Date/Time   NA 139 04/30/2014 1437   K 3.6 04/30/2014 1437   CL 103 04/30/2014 1437   CO2 28 04/30/2014 1437   BUN 15  04/30/2014 1437   CREATININE 1.07 04/30/2014 1437      Component Value Date/Time   CALCIUM 10.3 04/30/2014 1437   ALKPHOS 54 04/30/2014 1437   AST 47* 04/30/2014 1437   ALT 48* 04/30/2014 1437   BILITOT 0.4 04/30/2014 1437         Impression and Plan: Ms. Vellucci is 74 year old white female. She is a past history of locally recurrent adenocarcinoma of the colon. She and one resection back in 2000. She then received adjuvant FOLFOX.  I don't see any evidence of recurrent disease.  I told her to start taking baby aspirin again. I told her to take coated aspirin.  I also told her to take vitamin D. I think 2000 units a day of vitamin D would be helpful.  We will plan to  get her back in 6 more months.   [name], MD 2/4/20163:22 PM

## 2014-10-29 NOTE — Telephone Encounter (Signed)
Mailed august schedule °

## 2014-11-06 ENCOUNTER — Other Ambulatory Visit: Payer: Self-pay | Admitting: Internal Medicine

## 2014-11-06 DIAGNOSIS — R51 Headache: Principal | ICD-10-CM

## 2014-11-06 DIAGNOSIS — R519 Headache, unspecified: Secondary | ICD-10-CM

## 2014-11-09 ENCOUNTER — Other Ambulatory Visit: Payer: 59

## 2014-11-11 ENCOUNTER — Ambulatory Visit
Admission: RE | Admit: 2014-11-11 | Discharge: 2014-11-11 | Disposition: A | Discharge status: Home / Self Care | Payer: 59 | Source: Ambulatory Visit | Attending: Internal Medicine | Admitting: Internal Medicine

## 2014-11-11 DIAGNOSIS — R51 Headache: Principal | ICD-10-CM

## 2014-11-11 DIAGNOSIS — R519 Headache, unspecified: Secondary | ICD-10-CM

## 2014-11-16 ENCOUNTER — Other Ambulatory Visit: Payer: Self-pay | Admitting: Dermatology

## 2015-03-23 ENCOUNTER — Encounter: Payer: Self-pay | Admitting: Neurology

## 2015-03-23 ENCOUNTER — Ambulatory Visit (INDEPENDENT_AMBULATORY_CARE_PROVIDER_SITE_OTHER): Payer: Medicare Other | Admitting: Neurology

## 2015-03-23 ENCOUNTER — Telehealth: Payer: Self-pay | Admitting: Neurology

## 2015-03-23 VITALS — BP 161/71 | HR 57 | Temp 97.8°F | Ht 63.0 in | Wt 170.6 lb

## 2015-03-23 DIAGNOSIS — M5010 Cervical disc disorder with radiculopathy, unspecified cervical region: Secondary | ICD-10-CM

## 2015-03-23 DIAGNOSIS — M542 Cervicalgia: Secondary | ICD-10-CM

## 2015-03-23 DIAGNOSIS — M2480 Other specific joint derangements of unspecified joint, not elsewhere classified: Secondary | ICD-10-CM

## 2015-03-23 DIAGNOSIS — R29898 Other symptoms and signs involving the musculoskeletal system: Secondary | ICD-10-CM | POA: Diagnosis not present

## 2015-03-23 DIAGNOSIS — R519 Headache, unspecified: Secondary | ICD-10-CM | POA: Insufficient documentation

## 2015-03-23 DIAGNOSIS — R51 Headache: Secondary | ICD-10-CM

## 2015-03-23 MED ORDER — CYCLOBENZAPRINE HCL ER 15 MG PO CP24: 15.0000 mg | ORAL_CAPSULE | Freq: Every evening | ORAL | Status: DC

## 2015-03-23 NOTE — Telephone Encounter (Signed)
Pt called wanting to speak with the nurse and would not go into detail about what. Please call and advise [phone] and pt prefers to be called at this number. Ss

## 2015-03-23 NOTE — Patient Instructions (Signed)
Remember to drink plenty of fluid, eat healthy meals and do not skip any meals. Try to eat protein with a every meal and eat a healthy snack such as fruit or nuts in between meals. Try to keep a regular sleep-wake schedule and try to exercise daily, particularly in the form of walking, 20-30 minutes a day, if you can.   As far as your medications are concerned, I would like to suggest: Amrix 15mg  every evening.   As far as diagnostic testing: MRi of the cervical spine, physical therapy  I would like to see you back in 3 months, sooner if we need to. Please call us with any interim questions, concerns, problems, updates or refill requests.   Please also call us for any test results so we can go over those with you on the phone.  My clinical assistant and will answer any of your questions and relay your messages to me and also relay most of my messages to you.   Our phone number is [phone]. We also have an after hours call service for urgent matters and there is a physician on-call for urgent questions. For any emergencies you know to call 911 or go to the nearest emergency room  To prevent or relieve headaches, try the following: Cool Compress. Lie down and place a cool compress on your head.  Avoid headache triggers. If certain foods or odors seem to have triggered your migraines in the past, avoid them. A headache diary might help you identify triggers.  Include physical activity in your daily routine. Try a daily walk or other moderate aerobic exercise.  Manage stress. Find healthy ways to cope with the stressors, such as delegating tasks on your to-do list.  Practice relaxation techniques. Try deep breathing, yoga, massage and visualization.  Eat regularly. Eating regularly scheduled meals and maintaining a healthy diet might help prevent headaches. Also, drink plenty of fluids.  Follow a regular sleep schedule. Sleep deprivation might contribute to headaches Consider biofeedback. With  this mind-body technique, you learn to control certain bodily functions - such as muscle tension, heart rate and blood pressure - to prevent headaches or reduce headache pain.    Proceed to emergency room if you experience new or worsening symptoms or symptoms do not resolve, if you have new neurologic symptoms or if headache is severe, or for any concerning symptom.

## 2015-03-23 NOTE — Progress Notes (Signed)
[facility]    Provider:  Dr Jaynee Eagles Referring Provider: Leighton Ruff, MD Primary Care Physician:  Gerrit Heck, MD  CC:  Headache  HPI:  Robin [name] is a 74 y.o. female here as a referral from Dr. Drema Dallas for Headache. She has a past medical history of hyperlipidemia, obstructive sleep apnea, hypothyroidism, hypertension, depression, osteoarthritis, spinal stenosis, colon cancer status post chemotherapy, chronic kidney disease. She started having headaches the first of the year. Headaches continuous, progressed since January when it started. When the headache started, she had hit her forehead after a fall. Started with the left ear, eval of the ear was negative. She has neck pain and decreased ROM and her neck pops and squeaks. Pain in the back of the head (points to the craniocervical junction). She has been to "doctors and doctors". Had her eyes checked. Right now the headache is a dull ache on the sides of the head. MRi of the brain was completed in February and March of this year and was unremarkable per patient. Pressure on the sides of head. Continuous. Gets bad at night before bed 5-6/10. Feels better in the morning. No history of headaches previously. Heat helps the headache. Has neck pain that radiates into the right arm. She has pain in the arms. She has weakness in her arms. No blurry vision, no hearing changes. She is compliant with her cpap. No light sensitivity, no sound sensitivity, no nausea. No other focal neurologic deficits.  Reviewed notes, labs and imaging from outside physicians, which showed: CMP shows elevated glucose and minimally elevated AST and ALT. CBC unremarkable.    CT of the head 10/2014 showed No acute intracranial abnormalities including mass lesion or mass effect, hydrocephalus, extra-axial fluid collection, midline shift, hemorrhage, or acute infarction, large ischemic events (personally reviewed images). There was extensive  nonspecific white matter changes likely microvascular ischemic disease.     Review of Systems: Patient complains of symptoms per HPI as well as the following symptoms: Joint pain, ringing in ears. Pertinent negatives per HPI. All others negative.   History   Social History  . Marital Status: Divorced    Spouse Name: N/A  . Number of Children: 4  . Years of Education: 12+   Occupational History  . [employer]    Social History Main Topics  . Smoking status: Never Smoker   . Smokeless tobacco: Never Used     Comment: never used tobacco  . Alcohol Use: No  . Drug Use: No  . Sexual Activity: Not on file   Other Topics Concern  . Not on file   Social History Narrative   Lives at home with herself.   Caffeine use: Drinks coffee and tea (1 cup coffee per day). Tea rarely     No family history on file.  Past Medical History  Diagnosis Date  . Colon cancer     colon ca dx 2008 and 2010  . Hypertension   . Sleep apnea     Past Surgical History  Procedure Laterality Date  . Back surgery  2009    Current Outpatient Prescriptions  Medication Sig Dispense Refill  . amLODipine (NORVASC) 5 MG tablet Take 5 mg by mouth Daily.    . Cholecalciferol (VITAMIN D-3 PO) Take 2,000 Units by mouth daily.    Marland Kitchen FLUoxetine (PROZAC) 20 MG capsule Take 20 mg by mouth Daily.    . fluticasone (FLONASE) 50 MCG/ACT nasal spray Place 1 spray into the nose as needed.     Marland Kitchen  glucosamine-chondroitin 500-400 MG tablet Take 1 tablet by mouth daily. Pt states she takes 1000mg  per day    . Multiple Vitamin (MULTIVITAMIN) capsule Take 1 capsule by mouth daily.    . Probiotic Product (PROBIOTIC DAILY) CAPS Take by mouth every morning.    Marland Kitchen SYNTHROID 100 MCG tablet Take 100 mcg by mouth Daily.    Marland Kitchen triamterene-hydrochlorothiazide (DYAZIDE) 37.5-25 MG per capsule Take by mouth Daily. Dose is 37.5-25    . vitamin E (VITAMIN E) 1000 UNIT capsule Take 1,000 Units by mouth daily.    . cyclobenzaprine  (AMRIX) 15 MG 24 hr capsule Take 1 capsule (15 mg total) by mouth every evening. 10 capsule 0   No current facility-administered medications for this visit.    Allergies as of 03/23/2015 - Review Complete 03/23/2015  Allergen Reaction Noted  . Statins  10/23/2013    Vitals: BP 161/71 mmHg  Pulse 57  Temp(Src) 97.8 F (36.6 C) (Oral)  Ht 5\' 3"  (1.6 m)  Wt 170 lb 9.6 oz (77.384 kg)  BMI 30.23 kg/m2 Last Weight:  Wt Readings from Last 1 Encounters:  03/23/15 170 lb 9.6 oz (77.384 kg)   Last Height:   Ht Readings from Last 1 Encounters:  03/23/15 5\' 3"  (1.6 m)   Physical exam: Exam: Gen: NAD, conversant, well nourised, obese, well groomed                     CV: RRR, no MRG. No Carotid Bruits. No peripheral edema, warm, nontender Eyes: Conjunctivae clear without exudates or hemorrhage  Neuro: Detailed Neurologic Exam  Speech:    Speech is normal; fluent and spontaneous with normal comprehension.  Cognition:    The patient is oriented to person, place, and time;     recent and remote memory intact;     language fluent;     normal attention, concentration,     fund of knowledge Cranial Nerves:    The pupils are equal, round, and reactive to light. The fundi are normal and spontaneous venous pulsations are present. Visual fields are full to finger confrontation. Extraocular movements are intact. Trigeminal sensation is intact and the muscles of mastication are normal. The face is symmetric. The palate elevates in the midline. Hearing intact. Voice is normal. Shoulder shrug is normal. The tongue has normal motion without fasciculations.   Coordination:    No dysmetria  Gait:    No ataxia  Motor Observation:    No asymmetry, no atrophy, and no involuntary movements noted. Tone:    Normal muscle tone.    Posture:    Posture is normal. normal erect    Strength:    Strength is V/V in the upper and lower limbs.      Sensation: intact to LT     Reflex  Exam:  DTR's:    Deep tendon reflexes in the upper and lower extremities are symmetrical bilaterally.   Toes:    The toes are downgoing bilaterally.   Clonus:    Clonus is absent.       Assessment/Plan:   Robin [name] is a 74 y.o. female here as a referral from Dr. Drema Dallas for Headache. She has a past medical history of hyperlipidemia, obstructive sleep apnea, hypothyroidism, hypertension, depression, osteoarthritis, spinal stenosis, colon cancer status post chemotherapy, chronic kidney disease here for continuous daily headaches since the beginning of the year. She endorses neck pain, decreased range of motion, crepitus in the neck, radiation into the arms. CT  of the head unremarkable, MRI of the brain by patient report was also unremarkable. This is either tension-type headache, referred from musculoskeletal neck pain or cervical degeneration, may be related to the head trauma she says she suffered when it started. We'll order an MRI of the cervical spine. We'll start Amrix which is extended release cyclobenzaprine for her musculoskeletal pain. We'll also refer to physical therapy.  Sarina Ill, MD  [facility] [address] [place], [place] [address]  Phone [phone] Fax [phone]

## 2015-03-23 NOTE — Telephone Encounter (Signed)
Someone had called her and she thought it may be our office. I don't see a call from our office in the notes. thanks

## 2015-03-26 ENCOUNTER — Ambulatory Visit
Admission: RE | Admit: 2015-03-26 | Discharge: 2015-03-26 | Disposition: A | Discharge status: Home / Self Care | Payer: 59 | Source: Ambulatory Visit | Attending: Neurology | Admitting: Neurology

## 2015-03-26 DIAGNOSIS — M2480 Other specific joint derangements of unspecified joint, not elsewhere classified: Secondary | ICD-10-CM

## 2015-03-26 DIAGNOSIS — R519 Headache, unspecified: Secondary | ICD-10-CM

## 2015-03-26 DIAGNOSIS — R51 Headache: Secondary | ICD-10-CM

## 2015-03-26 DIAGNOSIS — R29898 Other symptoms and signs involving the musculoskeletal system: Secondary | ICD-10-CM | POA: Diagnosis not present

## 2015-03-26 DIAGNOSIS — M5010 Cervical disc disorder with radiculopathy, unspecified cervical region: Secondary | ICD-10-CM | POA: Diagnosis not present

## 2015-03-31 ENCOUNTER — Telehealth: Payer: Self-pay | Admitting: Neurology

## 2015-03-31 NOTE — Telephone Encounter (Signed)
Called patient to discuss mri cervical spine findings, left message. Will try again tomorrow.

## 2015-04-06 ENCOUNTER — Encounter: Payer: Self-pay | Admitting: *Deleted

## 2015-04-06 ENCOUNTER — Other Ambulatory Visit: Payer: Self-pay | Admitting: Neurology

## 2015-04-06 DIAGNOSIS — M542 Cervicalgia: Secondary | ICD-10-CM

## 2015-04-06 MED ORDER — PREGABALIN 75 MG PO CAPS: 75.0000 mg | ORAL_CAPSULE | Freq: Two times a day (BID) | ORAL | Status: DC

## 2015-04-06 NOTE — Progress Notes (Signed)
Left 6 sample boxes of Lyrica 75mg  in a bag with pt name and DOB for pt to pick up. Also left printed Rx for pt to pick up.

## 2015-04-06 NOTE — Telephone Encounter (Signed)
Patient called returning Dr Cathren Laine call. Patient can be reached at [phone] before 12 and [phone] after 1pm.

## 2015-04-06 NOTE — Telephone Encounter (Signed)
Spoke with patient about MRI cervical pain results, will start lyrica for musculoskeletal pain and refer to physical therapy.

## 2015-04-08 ENCOUNTER — Telehealth: Payer: Self-pay | Admitting: Neurology

## 2015-04-08 NOTE — Telephone Encounter (Signed)
Patient called and requested to speak with the nurse regarding her medication. She would like to know how much of the [facility] she is supposed to be taking. Please call and advise.

## 2015-04-08 NOTE — Telephone Encounter (Signed)
Left message with Lyrica directions. And left call back number for any further questions.

## 2015-04-27 ENCOUNTER — Ambulatory Visit: Payer: Medicare Other | Attending: Neurology | Admitting: Physical Therapy

## 2015-04-27 ENCOUNTER — Encounter: Payer: Self-pay | Admitting: Physical Therapy

## 2015-04-27 DIAGNOSIS — R29898 Other symptoms and signs involving the musculoskeletal system: Secondary | ICD-10-CM | POA: Insufficient documentation

## 2015-04-27 DIAGNOSIS — M542 Cervicalgia: Secondary | ICD-10-CM | POA: Diagnosis not present

## 2015-04-28 ENCOUNTER — Encounter: Payer: Self-pay | Admitting: Physical Therapy

## 2015-04-28 NOTE — Therapy (Signed)
[facility] [address] [employer] [employer] [place], [place], [address] Phone: [phone]   Fax:  [phone]  Physical Therapy Evaluation  Patient Details  Name: Robin [name] MRN: [id number] Date of Birth: 09-25-1941 Referring Provider:  Melvenia Beam, MD  Encounter Date: 04/27/2015      PT End of Session - 04/28/15 1412    Visit Number 1  G1   Number of Visits 8   Date for PT Re-Evaluation 05/28/15   Authorization Type U[employer] Medicare   Authorization Time Period 04-27-15 - 06-25-15   PT Start Time 0848   PT Stop Time 0936   PT Time Calculation (min) 48 min      Past Medical History  Diagnosis Date  . Colon cancer     colon ca dx 2008 and 2010  . Hypertension   . Sleep apnea     Past Surgical History  Procedure Laterality Date  . Back surgery  2009    There were no vitals filed for this visit.  Visit Diagnosis:  Cervical pain (neck) - Plan: PT plan of care cert/re-cert  Decreased ROM of neck - Plan: PT plan of care cert/re-cert      Subjective Assessment - 04/27/15 0854    Subjective Pt. reports neck pain started approx. 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  pt. reports she used to have headaches but they have lessened;                                                                                                                                                                                   Pertinent History h/o low back surgery Feb. 2009; colon cancer surgery Aug. 2008; R shoulder rotator cuff sx early 1990's   How long can you stand comfortably? difficulty standing still - states she has to sway   How long can you walk comfortably? no problems   Diagnostic  tests CT scan; MRI   Patient Stated Goals Reduce the neck pain   Currently in Pain? Other (Comment)  reports discomfort - not true pain            [facility] PT Assessment - 04/28/15 0001    Assessment   Medical Diagnosis Cervical disc disorder with radiculopathy    Onset Date/Surgical Date --  late 2015   Next MD Visit --  end of August  Prior Therapy none for this problem   Balance Screen   Has the patient fallen in the past 6 months No   Has the patient had a decrease in activity level because of a fear of falling?  No   Is the patient reluctant to leave their home because of a fear of falling?  No   Prior Function   Level of Independence Independent with community mobility without device   Vocation Part time employment   Posture/Postural Control   Posture/Postural Control Postural limitations   Postural Limitations Forward head;Increased thoracic kyphosis   ROM / Strength   AROM / PROM / Strength Strength   Strength   Overall Strength Within functional limits for tasks performed   Strength Assessment Site Cervical      Ultrasound at 50% pulsed 1.5 w/cm2 x 5" to L upper trap and 3" to R upper trap  Self care; pt instructed in HEP - cervical retraction and rotation with towel for overpressure and lateral flexion - 10 reps each    Pt. Reported some pain in bil. Arms near shoulders with holding UE's in flexion to perform stretch with towel - pain wa alleviated with change in position                 PT Education - 04/28/15 1410    Education provided Yes   Education Details stretching - neck rotation with towel for overpressure; lateral flexion and cervical retraction for postural retraining   Person(s) Educated Patient   Methods Explanation;Demonstration;Handout   Comprehension Verbalized understanding;Returned demonstration             PT Long Term Goals - 04/28/15 1418    PT LONG TERM GOAL #1   Title Pt. will report at least 25% improvement in  neck pain/discomfort  (05-28-15)   Time 4   Period Weeks   Status New   PT LONG TERM GOAL #2   Title Independent in HEP for cervical ROM and postural retraining exercises  (05-28-15)   Time 4   Period Weeks   Status New   PT LONG TERM GOAL #3   Title Increase bil. cervical rotation to >/= 58 degrees to incr. safety with driving.  (11-02-34)   Time 4   Period Weeks   Status New   PT LONG TERM GOAL #4   Title Increase FOTO by at least 10 points for improved pt satisfaction  (05-28-15)   Time 4   Period Weeks   Status New               Plan - 04/28/15 1414    Clinical Impression Statement Pt. presents with upper trap tightness with trigger points palpated with L tighter than R upper trap; cervical ROM is moderately decreased. Pt would benefit from skilled PT to address ROM deficits, postural dysfunction and cervcial pain   Pt will benefit from skilled therapeutic intervention in order to improve on the following deficits Decreased range of motion;Pain;Postural dysfunction   Rehab Potential Good   PT Frequency 2x / week   PT Duration 4 weeks   PT Treatment/Interventions ADLs/Self Care Home Management;Electrical Stimulation;Moist Heat;Ultrasound;Passive range of motion;Patient/family education;Neuromuscular re-education;Therapeutic exercise;Therapeutic activities;Manual techniques   PT Next Visit Plan ultrasound; e-stim prn depending on pain c/o; check cervical ROM HEP   PT Home Exercise Plan cervical stretches   Consulted and Agree with Plan of Care Patient          G-Codes - 05-10-15 1424    Functional  Assessment Tool Used c/o neck discomfort and tightness with occasional headaches   Functional Limitation Changing and maintaining body position   Changing and Maintaining Body Position Current Status [phone]) At least 40 percent but less than 60 percent impaired, limited or restricted   Changing and Maintaining Body Position Goal Status (F4734) At least 20 percent but less than 40  percent impaired, limited or restricted       Problem List Patient Active Problem List   Diagnosis Date Noted  . Musculoskeletal neck pain 03/23/2015  . Cervical disc disorder with radiculopathy of cervical region 03/23/2015  . Arm weakness 03/23/2015  . Decreased ROM of neck 03/23/2015  . Crepitus of cervical spine 03/23/2015  . New onset of headaches after age 60 03/23/2015  . Colon cancer 10/26/2011    DildayJenness Corner, PT 04/28/2015, 2:30 PM  [facility] [address] [place], [place], [address] Phone: [phone]   Fax:  [phone]

## 2015-04-28 NOTE — Patient Instructions (Addendum)
AROM, Rotation with Self-Assist   Sit or stand, one hand on same-side jaw. Turn head slowly to look over shoulder. Increase stretch by gently pushing with hand on jaw. Hold   5___ seconds.  Repeat 10  times per session. Do _2__ sessions per day.  Copyright  VHI. All rights reserved.  Neck: Retraction   Sit with back straight or lie on firm surface. Place hands, with fingers locked, behind head. Keep head in neutral position. Push back with head while resisting with hands. Do not let head actually move. Hold __20__ seconds. Repeat _3___ times. Do _2___ sessions per day. CAUTION: Pressure should be steady.  Copyright  VHI. All rights reserved.  Neck Retraction: Side-Bend   Sitting or standing, tuck chin and side-bend head toward left shoulder. Repeat __10__ times per set. Do _2___ sets per session. Do   2____ sessions per day.  http://orth.exer.us/387   Copyright  VHI. All rights reserved.

## 2015-04-29 ENCOUNTER — Ambulatory Visit (HOSPITAL_BASED_OUTPATIENT_CLINIC_OR_DEPARTMENT_OTHER): Payer: Medicare Other | Admitting: Hematology & Oncology

## 2015-04-29 ENCOUNTER — Encounter: Payer: Self-pay | Admitting: Hematology & Oncology

## 2015-04-29 ENCOUNTER — Other Ambulatory Visit (HOSPITAL_BASED_OUTPATIENT_CLINIC_OR_DEPARTMENT_OTHER): Payer: Medicare Other

## 2015-04-29 VITALS — BP 122/67 | HR 66 | Temp 98.1°F | Resp 16 | Ht 63.0 in | Wt 167.0 lb

## 2015-04-29 DIAGNOSIS — E559 Vitamin D deficiency, unspecified: Secondary | ICD-10-CM

## 2015-04-29 DIAGNOSIS — C189 Malignant neoplasm of colon, unspecified: Secondary | ICD-10-CM

## 2015-04-29 DIAGNOSIS — Z85038 Personal history of other malignant neoplasm of large intestine: Secondary | ICD-10-CM

## 2015-04-29 LAB — CBC WITH DIFFERENTIAL (CANCER CENTER ONLY)
BASO#: 0 10*3/uL (ref 0.0–0.2)
BASO%: 0.5 % (ref 0.0–2.0)
EOS%: 4 % (ref 0.0–7.0)
Eosinophils Absolute: 0.3 10*3/uL (ref 0.0–0.5)
HEMATOCRIT: 37.6 % (ref 34.8–46.6)
HGB: 13.3 g/dL (ref 11.6–15.9)
LYMPH#: 2.4 10*3/uL (ref 0.9–3.3)
LYMPH%: 36.3 % (ref 14.0–48.0)
MCH: 31.9 pg (ref 26.0–34.0)
MCHC: 35.4 g/dL (ref 32.0–36.0)
MCV: 90 fL (ref 81–101)
MONO#: 0.6 10*3/uL (ref 0.1–0.9)
MONO%: 8.8 % (ref 0.0–13.0)
NEUT#: 3.3 10*3/uL (ref 1.5–6.5)
NEUT%: 50.4 % (ref 39.6–80.0)
PLATELETS: 167 10*3/uL (ref 145–400)
RBC: 4.17 10*6/uL (ref 3.70–5.32)
RDW: 12.4 % (ref 11.1–15.7)
WBC: 6.6 10*3/uL (ref 3.9–10.0)

## 2015-04-29 NOTE — Progress Notes (Signed)
Hematology and Oncology Follow Up Visit  Robin [name] [id number] 1941/01/12 74 y.o. 04/29/2015   Principle Diagnosis:  History of locally recurrent adenocarcinoma of the colon  Current Therapy:    Observation     Interim History:  [name] is back for follow-up. We see her every 6 months. Since we last saw, she's been doing pretty well.   She just returned from a family vacation down to the beach. She enjoyed it. She says 16 people came down.  She's had no problems with bowels or bladder. She's had no bleeding. She's had no rashes. She had no leg swelling. She's had no.  Her last mammogram was done back in August. Everything looked okay.  Her last CEA level done back in February was 2.5.  Medications:  Current outpatient prescriptions:  .  amLODipine (NORVASC) 5 MG tablet, Take 5 mg by mouth Daily., Disp: , Rfl:  .  Cholecalciferol (VITAMIN D-3 PO), Take 2,000 Units by mouth daily., Disp: , Rfl:  .  cyclobenzaprine (AMRIX) 15 MG 24 hr capsule, Take 1 capsule (15 mg total) by mouth every evening., Disp: 10 capsule, Rfl: 0 .  FLUoxetine (PROZAC) 20 MG capsule, Take 20 mg by mouth Daily., Disp: , Rfl:  .  fluticasone (FLONASE) 50 MCG/ACT nasal spray, Place 1 spray into the nose as needed. , Disp: , Rfl:  .  glucosamine-chondroitin 500-400 MG tablet, Take 1 tablet by mouth daily. Pt states she takes 1000mg  per day, Disp: , Rfl:  .  Multiple Vitamin (MULTIVITAMIN) capsule, Take 1 capsule by mouth daily., Disp: , Rfl:  .  pregabalin (LYRICA) 75 MG capsule, Take 1 capsule (75 mg total) by mouth 2 (two) times daily., Disp: 60 capsule, Rfl: 3 .  Probiotic Product (PROBIOTIC DAILY) CAPS, Take by mouth every morning., Disp: , Rfl:  .  SYNTHROID 100 MCG tablet, Take 100 mcg by mouth Daily., Disp: , Rfl:  .  triamterene-hydrochlorothiazide (DYAZIDE) 37.5-25 MG per capsule, Take by mouth Daily. Dose is 37.5-25, Disp: , Rfl:  .  vitamin E (VITAMIN E) 1000 UNIT capsule, Take 1,000 Units by  mouth daily., Disp: , Rfl:   Allergies:  Allergies  Allergen Reactions  . Statins     Aches in her bones    Past Medical History, Surgical history, Social history, and Family History were reviewed and updated.  Review of Systems: As above  Physical Exam:  height is 5\' 3"  (1.6 m) and weight is 167 lb (75.751 kg). Her oral temperature is 98.1 F (36.7 C). Her blood pressure is 122/67 and her pulse is 66. Her respiration is 16.   Well-developed and well-nourished white female in no obvious distress. Head and neck exam shows no ocular or oral lesions. She has no adenopathy in the neck. Lungs are clear. Cardiac exam regular rate and rhythm with no murmurs, rubs or bruits. Abdomen is soft. She has good bowel sounds. She has well-healed laparoscopy laparotomy scars. She has no fluid wave. There is no palpable liver or spleen tip. Back exam shows no tenderness over the spine, ribs or hips. Externally shows no clubbing, cyanosis or edema. She does have osteoarthritic changes in her joints. Skin exam shows no suspicious rashes, ecchymoses or petechia. Neurological exam shows no focal neurological deficits.  Lab Results  Component Value Date   WBC 6.6 04/29/2015   HGB 13.3 04/29/2015   HCT 37.6 04/29/2015   MCV 90 04/29/2015   PLT 167 04/29/2015     Chemistry  Component Value Date/Time   NA 136 10/29/2014 1415   K 3.6 10/29/2014 1415   CL 101 10/29/2014 1415   CO2 25 10/29/2014 1415   BUN 13 10/29/2014 1415   CREATININE 0.95 10/29/2014 1415      Component Value Date/Time   CALCIUM 10.1 10/29/2014 1415   ALKPHOS 50 10/29/2014 1415   AST 55* 10/29/2014 1415   ALT 58* 10/29/2014 1415   BILITOT 0.6 10/29/2014 1415         Impression and Plan: Robin [name] is 74 year old white female. She is a past history of locally recurrent adenocarcinoma of the colon. She underwent resection back in 2000. She then received adjuvant FOLFOX.  I don't see any evidence of recurrent  disease.  She is taking baby aspirin. I think this is very helpful.  Her last mammogram was back in October 2015. She hasn't set up for one for this year.  I also told her to take vitamin D. I think 2000 units a day of vitamin D would be helpful.  We will plan to get her back in 6 more months.   [name], MD 8/4/20164:11 PM

## 2015-04-30 ENCOUNTER — Telehealth: Payer: Self-pay | Admitting: *Deleted

## 2015-04-30 LAB — COMPREHENSIVE METABOLIC PANEL
ALK PHOS: 56 U/L (ref 33–130)
ALT: 77 U/L — ABNORMAL HIGH (ref 6–29)
AST: 70 U/L — AB (ref 10–35)
Albumin: 4.3 g/dL (ref 3.6–5.1)
BILIRUBIN TOTAL: 0.6 mg/dL (ref 0.2–1.2)
BUN: 12 mg/dL (ref 7–25)
CHLORIDE: 100 mmol/L (ref 98–110)
CO2: 24 mmol/L (ref 20–31)
Calcium: 9.8 mg/dL (ref 8.6–10.4)
Creatinine, Ser: 1 mg/dL — ABNORMAL HIGH (ref 0.60–0.93)
Glucose, Bld: 312 mg/dL — ABNORMAL HIGH (ref 65–99)
POTASSIUM: 3.7 mmol/L (ref 3.5–5.3)
SODIUM: 136 mmol/L (ref 135–146)
Total Protein: 6.8 g/dL (ref 6.1–8.1)

## 2015-04-30 LAB — CEA: CEA: 2.8 ng/mL (ref 0.0–5.0)

## 2015-04-30 LAB — VITAMIN D 25 HYDROXY (VIT D DEFICIENCY, FRACTURES): Vit D, 25-Hydroxy: 27 ng/mL — ABNORMAL LOW (ref 30–100)

## 2015-04-30 NOTE — Telephone Encounter (Signed)
-----   Message from [name], MD sent at 04/30/2015  7:10 AM EDT ----- Call - vit D level is low!! How much is she taking and is she actually taking it?!?!?  pete

## 2015-04-30 NOTE — Telephone Encounter (Addendum)
Patient is taking 2000 units a day. Dr Marin Olp would like her to take 4000 units a day. She understood this.   ----- Message from [name], MD sent at 04/30/2015  7:10 AM EDT ----- Call - vit D level is low!! How much is she taking and is she actually taking it?!?!?  pete

## 2015-05-07 ENCOUNTER — Ambulatory Visit: Payer: Medicare Other | Admitting: Physical Therapy

## 2015-05-07 ENCOUNTER — Encounter: Payer: Self-pay | Admitting: Physical Therapy

## 2015-05-07 DIAGNOSIS — M542 Cervicalgia: Secondary | ICD-10-CM

## 2015-05-07 DIAGNOSIS — R29898 Other symptoms and signs involving the musculoskeletal system: Secondary | ICD-10-CM

## 2015-05-07 NOTE — Therapy (Signed)
[facility] [address] [place], [place], [address] Phone: [phone]   Fax:  [phone]  Physical Therapy Treatment  Patient Details  Name: Robin [name] MRN: [id number] Date of Birth: [birth year] Referring Provider:  Leighton Ruff, MD  Encounter Date: 05/07/2015      PT End of Session - 05/07/15 2211    Visit Number 2  G2   Number of Visits 8   Date for PT Re-Evaluation 05/28/15   Authorization Type UHC Medicare   Authorization Time Period 04-27-15 - 06-25-15   PT Start Time 0935   PT Stop Time 1017   PT Time Calculation (min) 42 min      Past Medical History  Diagnosis Date  . Colon cancer     colon ca dx 2008 and 2010  . Hypertension   . Sleep apnea     Past Surgical History  Procedure Laterality Date  . Back surgery  2009    There were no vitals filed for this visit.  Visit Diagnosis:  Cervical pain (neck)  Decreased ROM of neck      Subjective Assessment - 05/07/15 2206    Subjective Pt. reports some decr. in neck pain since previous PT session; states she has been doing stretches at home   Pertinent History h/o low back surgery Feb. 2009; colon cancer surgery Aug. 2008; R shoulder rotator cuff sx early 1990's   How long can you stand comfortably? difficulty standing still - states she has to sway   How long can you walk comfortably? no problems   Diagnostic tests CT scan; MRI   Patient Stated Goals Reduce the neck pain   Currently in Pain? No/denies  tightness but no true pain                         [facility] Adult PT Treatment/Exercise - 05/07/15 0001    Ultrasound   Ultrasound Location bil. upper traps   Ultrasound Parameters 1.5 w/cm2 50% pulsed 6" L side; 2" R side   Ultrasound Goals Pain   Manual Therapy   Manual Therapy Soft tissue mobilization   Manual therapy comments L upper trap tighter than R side      TherEx; scapular retraction with red theraband x  10 reps with elbows flexed; extended into abduction 10 reps; 5 reps Diagonally each direction; Cervical AROM - rotation and lateral flexion;  Isometric contraction with self resistance for  R rotation - pushing toward L and R lateral fleixon pushing up toward L side - 5 reps each with 3 sec hold            PT Education - 05/07/15 2210    Education provided Yes   Education Details added isometric for L rotation and lateral flexion   Person(s) Educated Patient   Methods Explanation;Demonstration   Comprehension Verbalized understanding;Returned demonstration             PT Long Term Goals - 04/28/15 1418    PT LONG TERM GOAL #1   Title Pt. will report at least 25% improvement in neck pain/discomfort  (05-28-15)   Time 4   Period Weeks   Status New   PT LONG TERM GOAL #2   Title Independent in HEP for cervical ROM and postural retraining exercises  (05-28-15)   Time 4   Period Weeks   Status New   PT LONG TERM GOAL #3   Title Increase bil. cervical rotation  to >/= 58 degrees to incr. safety with driving.  (02-28-05)   Time 4   Period Weeks   Status New   PT LONG TERM GOAL #4   Title Increase FOTO by at least 10 points for improved pt satisfaction  (05-28-15)   Time 4   Period Weeks   Status New               Plan - 05/07/15 2212    Clinical Impression Statement Pt. reports less pain in cervical area since treatment last week; L upper trap is tighter than R upper with some discomfort reported with deep palpation   Pt will benefit from skilled therapeutic intervention in order to improve on the following deficits Decreased range of motion;Pain;Postural dysfunction   Rehab Potential Good   PT Frequency 2x / week   PT Duration 4 weeks   PT Treatment/Interventions ADLs/Self Care Home Management;Electrical Stimulation;Moist Heat;Ultrasound;Passive range of motion;Patient/family education;Neuromuscular re-education;Therapeutic exercise;Therapeutic activities;Manual  techniques   PT Next Visit Plan ultrasound; e-stim prn depending on pain c/o;    PT Home Exercise Plan cervical stretches   Consulted and Agree with Plan of Care Patient        Problem List Patient Active Problem List   Diagnosis Date Noted  . Musculoskeletal neck pain 03/23/2015  . Cervical disc disorder with radiculopathy of cervical region 03/23/2015  . Arm weakness 03/23/2015  . Decreased ROM of neck 03/23/2015  . Crepitus of cervical spine 03/23/2015  . New onset of headaches after age 13 03/23/2015  . Colon cancer 10/26/2011    Alda Lea, PT 05/07/2015, 10:15 PM  [facility] [address] [place], [place], [address] Phone: [phone]   Fax:  [phone]

## 2015-05-11 ENCOUNTER — Ambulatory Visit: Payer: Medicare Other | Admitting: Physical Therapy

## 2015-05-11 DIAGNOSIS — M542 Cervicalgia: Secondary | ICD-10-CM | POA: Diagnosis not present

## 2015-05-11 DIAGNOSIS — R29898 Other symptoms and signs involving the musculoskeletal system: Secondary | ICD-10-CM

## 2015-05-12 ENCOUNTER — Encounter: Payer: Self-pay | Admitting: Physical Therapy

## 2015-05-12 NOTE — Therapy (Signed)
[facility] [address] [place], [place], [address] Phone: [phone]   Fax:  [phone]  Physical Therapy Treatment  Patient Details  Name: Robin [name] MRN: [id number] Date of Birth: Nov 21, 1940 Referring Provider:  Leighton Ruff, MD  Encounter Date: 05/11/2015      PT End of Session - 05/12/15 2113    Visit Number 3   Number of Visits 8   Date for PT Re-Evaluation 05/28/15   Authorization Type UHC Medicare   Authorization Time Period 04-27-15 - 06-25-15   PT Start Time 1536   PT Stop Time 1616   PT Time Calculation (min) 40 min      Past Medical History  Diagnosis Date  . Colon cancer     colon ca dx 2008 and 2010  . Hypertension   . Sleep apnea     Past Surgical History  Procedure Laterality Date  . Back surgery  2009    There were no vitals filed for this visit.  Visit Diagnosis:  Cervical pain (neck)  Decreased ROM of neck      Subjective Assessment - 05/12/15 2111    Subjective Pt. reports no real pain today - doing much better, feels that new office space with more room has helped as well as doing stretches for neck and shoulders   Pertinent History h/o low back surgery Feb. 2009; colon cancer surgery Aug. 2008; R shoulder rotator cuff sx early 1990's   Patient Stated Goals Reduce the neck pain   Currently in Pain? No/denies                         [facility] Adult PT Treatment/Exercise - 05/12/15 0001    Ultrasound   Ultrasound Location bil upper traps and posterior cervical region R and L   Ultrasound Parameters 1.5 w/cm2 50% pulsed   Ultrasound Goals Pain   Manual Therapy   Manual Therapy Soft tissue mobilization   Manual therapy comments L upper trap tighter than R side     Reviewed HEP - cervical stretches                PT Long Term Goals - 05/12/15 2115    PT LONG TERM GOAL #1   Title Pt. will report at least 25% improvement in neck  pain/discomfort  (05-28-15)   Baseline met 05-11-15   Status Achieved   PT LONG TERM GOAL #2   Title Independent in HEP for cervical ROM and postural retraining exercises  (05-28-15)   Baseline met 05-11-15   Status Achieved   PT LONG TERM GOAL #3   Title Increase bil. cervical rotation to >/= 58 degrees to incr. safety with driving.  (09-30-15)   Baseline met 05-11-15 as rotation is WFL's   Status Achieved   PT LONG TERM GOAL #4   Title Increase FOTO by at least 10 points for improved pt satisfaction  (05-28-15)   Status Achieved               Plan - 05/12/15 2114    Clinical Impression Statement Pt. reports minimal to no pain in cervical region; AROM of neck is WFL's; pt is requesting D/C due to pleased with current functional status and progress   Pt will benefit from skilled therapeutic intervention in order to improve on the following deficits Decreased range of motion;Pain;Postural dysfunction   Rehab Potential Good   PT Frequency 2x / week   PT  Duration 4 weeks   PT Treatment/Interventions ADLs/Self Care Home Management;Electrical Stimulation;Moist Heat;Ultrasound;Passive range of motion;Patient/family education;Neuromuscular re-education;Therapeutic exercise;Therapeutic activities;Manual techniques   PT Next Visit Plan N/A- D/C   PT Home Exercise Plan cervical stretches   Consulted and Agree with Plan of Care Patient          G-Codes - 05/19/15 11/26/2117    Functional Assessment Tool Used no c/o neck pain at this time; AROM cervical region is WFL's   Functional Limitation Changing and maintaining body position   Changing and Maintaining Body Position Goal Status (M8610) At least 20 percent but less than 40 percent impaired, limited or restricted   Changing and Maintaining Body Position Discharge Status (C2473) At least 1 percent but less than 20 percent impaired, limited or restricted      Problem List Patient Active Problem List   Diagnosis Date Noted  . Musculoskeletal  neck pain 03/23/2015  . Cervical disc disorder with radiculopathy of cervical region 03/23/2015  . Arm weakness 03/23/2015  . Decreased ROM of neck 03/23/2015  . Crepitus of cervical spine 03/23/2015  . New onset of headaches after age 63 03/23/2015  . Colon cancer 10/26/2011  PHYSICAL THERAPY DISCHARGE SUMMARY  Visits from Start of Care: 3  Current functional level related to goals / functional outcomes: See above - all LTG's met   Remaining deficits: Pt reports intermittent cervical pain depending on activity but states pain has significantly decr. Since initial eval   Education / Equipment: Pt. Has been instructed in a HEP for cervical ROM and stretches and postural retraining exercises Plan: Patient agrees to discharge.  Patient goals were met. Patient is being discharged due to meeting the stated rehab goals.  ?????       ZRUYPO, JIZQU [id number], PT 05/12/2015, [facility] [address] [place], [place], [address] Phone: [phone]   Fax:  [phone]

## 2015-05-17 ENCOUNTER — Ambulatory Visit: Payer: Medicare Other | Admitting: Physical Therapy

## 2015-05-25 ENCOUNTER — Ambulatory Visit: Payer: Medicare Other | Admitting: Physical Therapy

## 2015-06-23 ENCOUNTER — Ambulatory Visit: Payer: Medicare Other | Admitting: Neurology

## 2015-06-23 ENCOUNTER — Other Ambulatory Visit: Payer: Self-pay

## 2015-06-23 ENCOUNTER — Other Ambulatory Visit: Payer: Self-pay | Admitting: Family Medicine

## 2015-06-23 DIAGNOSIS — Z1231 Encounter for screening mammogram for malignant neoplasm of breast: Secondary | ICD-10-CM

## 2015-06-24 ENCOUNTER — Emergency Department (HOSPITAL_COMMUNITY)
Admission: EM | Admit: 2015-06-24 | Discharge: 2015-06-25 | Disposition: A | Discharge status: Home / Self Care | Payer: Medicare Other | Attending: Emergency Medicine | Admitting: Emergency Medicine

## 2015-06-24 ENCOUNTER — Encounter (HOSPITAL_COMMUNITY): Payer: Self-pay

## 2015-06-24 DIAGNOSIS — Z7951 Long term (current) use of inhaled steroids: Secondary | ICD-10-CM | POA: Insufficient documentation

## 2015-06-24 DIAGNOSIS — Z79899 Other long term (current) drug therapy: Secondary | ICD-10-CM | POA: Diagnosis not present

## 2015-06-24 DIAGNOSIS — Z85038 Personal history of other malignant neoplasm of large intestine: Secondary | ICD-10-CM | POA: Insufficient documentation

## 2015-06-24 DIAGNOSIS — Z8669 Personal history of other diseases of the nervous system and sense organs: Secondary | ICD-10-CM | POA: Diagnosis not present

## 2015-06-24 DIAGNOSIS — R739 Hyperglycemia, unspecified: Secondary | ICD-10-CM

## 2015-06-24 DIAGNOSIS — Z8632 Personal history of gestational diabetes: Secondary | ICD-10-CM | POA: Insufficient documentation

## 2015-06-24 DIAGNOSIS — I10 Essential (primary) hypertension: Secondary | ICD-10-CM | POA: Diagnosis not present

## 2015-06-24 DIAGNOSIS — M1990 Unspecified osteoarthritis, unspecified site: Secondary | ICD-10-CM | POA: Insufficient documentation

## 2015-06-24 HISTORY — DX: Gestational diabetes mellitus in pregnancy, unspecified control: O24.419

## 2015-06-24 HISTORY — DX: Unspecified osteoarthritis, unspecified site: M19.90

## 2015-06-24 LAB — CBG MONITORING, ED
Glucose-Capillary: 461 mg/dL — ABNORMAL HIGH (ref 65–99)
Glucose-Capillary: 373 mg/dL — ABNORMAL HIGH (ref 65–99)
Glucose-Capillary: 354 mg/dL — ABNORMAL HIGH (ref 65–99)

## 2015-06-24 LAB — URINE MICROSCOPIC-ADD ON

## 2015-06-24 LAB — BASIC METABOLIC PANEL
Anion gap: 12 (ref 5–15)
BUN: 18 mg/dL (ref 6–20)
CO2: 24 mmol/L (ref 22–32)
Calcium: 10.1 mg/dL (ref 8.9–10.3)
Chloride: 95 mmol/L — ABNORMAL LOW (ref 101–111)
Creatinine, Ser: 1.03 mg/dL — ABNORMAL HIGH (ref 0.44–1.00)
GFR calc Af Amer: >60 mL/min (ref >60)
GFR calc non Af Amer: 52 mL/min — ABNORMAL LOW (ref >60)
Glucose, Bld: 476 mg/dL — ABNORMAL HIGH (ref 65–99)
Potassium: 4.1 mmol/L (ref 3.5–5.1)
Sodium: 131 mmol/L — ABNORMAL LOW (ref 135–145)

## 2015-06-24 LAB — URINALYSIS, ROUTINE W REFLEX MICROSCOPIC
Bilirubin Urine: NEGATIVE
Glucose, UA: >1000 mg/dL — AB
Hgb urine dipstick: NEGATIVE
Ketones, ur: 40 mg/dL — AB
Leukocytes, UA: NEGATIVE
Nitrite: NEGATIVE
Protein, ur: NEGATIVE mg/dL
Specific Gravity, Urine: 1.043 — ABNORMAL HIGH (ref 1.005–1.030)
Urobilinogen, UA: 0.2 mg/dL (ref 0.0–1.0)
pH: 6 (ref 5.0–8.0)

## 2015-06-24 LAB — CBC
HCT: 38 % (ref 36.0–46.0)
Hemoglobin: 13.7 g/dL (ref 12.0–15.0)
MCH: 31.6 pg (ref 26.0–34.0)
MCHC: 36.1 g/dL — ABNORMAL HIGH (ref 30.0–36.0)
MCV: 87.8 fL (ref 78.0–100.0)
Platelets: 176 10*3/uL (ref 150–400)
RBC: 4.33 MIL/uL (ref 3.87–5.11)
RDW: 12.3 % (ref 11.5–15.5)
WBC: 7.4 10*3/uL (ref 4.0–10.5)

## 2015-06-24 MED ORDER — INSULIN ASPART 100 UNIT/ML ~~LOC~~ SOLN
6.0000 [IU] | Freq: Once | SUBCUTANEOUS | Status: AC
  Administered 2015-06-24: 6 [IU] via INTRAVENOUS
  Filled 2015-06-24: qty 1

## 2015-06-24 MED ORDER — INSULIN ASPART 100 UNIT/ML ~~LOC~~ SOLN
5.0000 [IU] | Freq: Once | SUBCUTANEOUS | Status: AC
  Administered 2015-06-24: 5 [IU] via INTRAVENOUS
  Filled 2015-06-24: qty 0.05
  Filled 2015-06-24: qty 1
  Filled 2015-06-24: qty 0.05

## 2015-06-24 MED ORDER — METFORMIN HCL 500 MG PO TABS
500.0000 mg | ORAL_TABLET | Freq: Once | ORAL | Status: AC
  Administered 2015-06-24: 500 mg via ORAL
  Filled 2015-06-24: qty 1

## 2015-06-24 MED ORDER — SODIUM CHLORIDE 0.9 % IV BOLUS (SEPSIS)
2000.0000 mL | Freq: Once | INTRAVENOUS | Status: AC
  Administered 2015-06-24: 2000 mL via INTRAVENOUS

## 2015-06-24 NOTE — ED Notes (Signed)
Pt eating sandwich from [employer] at this time

## 2015-06-24 NOTE — ED Notes (Signed)
Patient reports eating 1/2 of a 6" [place] sandwich from [employer]. Patient also had a regular ginger-ale.

## 2015-06-24 NOTE — ED Notes (Signed)
Soda given to patient

## 2015-06-24 NOTE — ED Notes (Signed)
Pt sent by PCP after having an elevated glucose (480).  Pt c/o dry mouth, polyuria, and anorexia.  Denies pain.  Denies Hx of DM.

## 2015-06-25 LAB — CBG MONITORING, ED: Glucose-Capillary: 332 mg/dL — ABNORMAL HIGH (ref 65–99)

## 2015-06-25 MED ORDER — METFORMIN HCL 500 MG PO TABS: 500.0000 mg | ORAL_TABLET | Freq: Two times a day (BID) | ORAL | Status: DC

## 2015-06-26 LAB — HEMOGLOBIN A1C
Hgb A1c MFr Bld: 12.1 % — ABNORMAL HIGH (ref 4.8–5.6)
Mean Plasma Glucose: 301 mg/dL

## 2015-06-30 LAB — HM DIABETES EYE EXAM

## 2015-07-03 NOTE — ED Provider Notes (Signed)
CSN: [id number]     Arrival date & time 06/24/15  1823 History   First MD Initiated Contact with Patient 06/24/15 2122     Chief Complaint  Patient presents with  . Hyperglycemia     (Consider location/radiation/quality/duration/timing/severity/associated sxs/prior Treatment) HPI   73yF with hyperglycemia. No diagnosed hx of diabetes. Reports glucose in 400s at PCP. Reports polyuria, polydipsia and generalized fatigue worsening over past several weeks. Nausea. Headaches. No recent steroids or infectious symptoms.   Past Medical History  Diagnosis Date  . Colon cancer     colon ca dx 2008 and 2010  . Hypertension   . Sleep apnea   . Arthritis   . Gestational diabetes    Past Surgical History  Procedure Laterality Date  . Back surgery  2009  . Rotator cuff repair    . Colon surgery    . Appendectomy    . Abdominal hysterectomy    . Carpal tunnel release     Family History  Problem Relation Age of Onset  . Migraines Neg Hx    Social History  Substance Use Topics  . Smoking status: Never Smoker   . Smokeless tobacco: Never Used     Comment: never used tobacco  . Alcohol Use: No   OB History    No data available     Review of Systems  All systems reviewed and negative, other than as noted in HPI.   Allergies  Statins  Home Medications   Prior to Admission medications   Medication Sig Start Date End Date Taking? Authorizing Provider  amLODipine (NORVASC) 5 MG tablet Take 5 mg by mouth Daily. 07/28/11  Yes Historical Provider, MD  beta carotene w/minerals (OCUVITE) tablet Take 1 tablet by mouth daily.   Yes Historical Provider, MD  Cholecalciferol (VITAMIN D-3 PO) Take 2,000 Units by mouth daily.   Yes Historical Provider, MD  FLUoxetine (PROZAC) 20 MG capsule Take 20 mg by mouth Daily. 07/27/11  Yes Historical Provider, MD  glucosamine-chondroitin 500-400 MG tablet Take 1 tablet by mouth daily.    Yes Historical Provider, MD  levothyroxine (SYNTHROID,  LEVOTHROID) 112 MCG tablet Take 112 mcg by mouth daily before breakfast.  06/24/15  Yes Historical Provider, MD  Multiple Vitamin (MULTIVITAMIN) capsule Take 1 capsule by mouth daily.   Yes Historical Provider, MD  Probiotic Product (PROBIOTIC DAILY) CAPS Take 1 capsule by mouth every morning.    Yes Historical Provider, MD  SYNTHROID 100 MCG tablet Take 100 mcg by mouth Daily. 09/09/11  Yes Historical Provider, MD  triamterene-hydrochlorothiazide (DYAZIDE) 37.5-25 MG per capsule Take 1 capsule by mouth Daily.  07/27/11  Yes Historical Provider, MD  cyclobenzaprine (AMRIX) 15 MG 24 hr capsule Take 1 capsule (15 mg total) by mouth every evening. Patient not taking: Reported on 06/24/2015 03/23/15   Melvenia Beam, MD  fluticasone [facility]) 50 MCG/ACT nasal spray Place 1 spray into the nose as needed.  01/23/13   Historical Provider, MD  metFORMIN (GLUCOPHAGE) 500 MG tablet Take 1 tablet (500 mg total) by mouth 2 (two) times daily with a meal. 06/25/15   Virgel Manifold, MD  pregabalin (LYRICA) 75 MG capsule Take 1 capsule (75 mg total) by mouth 2 (two) times daily. Patient not taking: Reported on 06/24/2015 04/06/15   Melvenia Beam, MD   BP 157/63 mmHg  Pulse 67  Temp(Src) 98 F (36.7 C) (Oral)  Resp 18  SpO2 98% Physical Exam  Constitutional: She appears well-developed and well-nourished. No distress.  HENT:  Head: Normocephalic and atraumatic.  Eyes: Conjunctivae are normal. Right eye exhibits no discharge. Left eye exhibits no discharge.  Neck: Neck supple.  Cardiovascular: Normal rate, regular rhythm and normal heart sounds.  Exam reveals no gallop and no friction rub.   No murmur heard. Pulmonary/Chest: Effort normal and breath sounds normal. No respiratory distress.  Abdominal: Soft. She exhibits no distension. There is no tenderness.  Musculoskeletal: She exhibits no edema or tenderness.  Neurological: She is alert.  Skin: Skin is warm and dry.  Psychiatric: She has a normal mood and  affect. Her behavior is normal. Thought content normal.  Nursing note and vitals reviewed.   ED Course  Procedures (including critical care time) Labs Review Labs Reviewed  BASIC METABOLIC PANEL - Abnormal; Notable for the following:    Sodium 131 (*)    Chloride 95 (*)    Glucose, Bld 476 (*)    Creatinine, Ser 1.03 (*)    GFR calc non Af Amer 52 (*)    All other components within normal limits  CBC - Abnormal; Notable for the following:    MCHC 36.1 (*)    All other components within normal limits  URINALYSIS, ROUTINE W REFLEX MICROSCOPIC (NOT AT [facility]) - Abnormal; Notable for the following:    Specific Gravity, Urine 1.043 (*)    Glucose, UA >1000 (*)    Ketones, ur 40 (*)    All other components within normal limits  HEMOGLOBIN A1C - Abnormal; Notable for the following:    Hgb A1c MFr Bld 12.1 (*)    All other components within normal limits  CBG MONITORING, ED - Abnormal; Notable for the following:    Glucose-Capillary 461 (*)    All other components within normal limits  CBG MONITORING, ED - Abnormal; Notable for the following:    Glucose-Capillary 373 (*)    All other components within normal limits  CBG MONITORING, ED - Abnormal; Notable for the following:    Glucose-Capillary 354 (*)    All other components within normal limits  CBG MONITORING, ED - Abnormal; Notable for the following:    Glucose-Capillary 332 (*)    All other components within normal limits  URINE MICROSCOPIC-ADD ON    Imaging Review No results found. I have personally reviewed and evaluated these images and lab results as part of my medical decision-making.   EKG Interpretation None      MDM   Final diagnoses:  Hyperglycemia    New onset diabetes. Not in DKA. IVF and some insulin in ED. Started on metformin. I feel safe for DC at this time. Needs close outpt FU. It has been determined that no acute conditions requiring further emergency intervention are present at this time. The  patient has been advised of the diagnosis and plan. I reviewed any labs and imaging including any potential incidental findings. We have discussed signs and symptoms that warrant return to the ED and they are listed in the discharge instructions.      Virgel Manifold, MD 07/03/15 2253

## 2015-07-09 ENCOUNTER — Encounter: Payer: Medicare Other | Attending: Family Medicine | Admitting: *Deleted

## 2015-07-09 ENCOUNTER — Encounter: Payer: Self-pay | Admitting: *Deleted

## 2015-07-09 VITALS — Ht 62.0 in | Wt 159.5 lb

## 2015-07-09 DIAGNOSIS — Z713 Dietary counseling and surveillance: Secondary | ICD-10-CM | POA: Insufficient documentation

## 2015-07-09 DIAGNOSIS — E118 Type 2 diabetes mellitus with unspecified complications: Secondary | ICD-10-CM | POA: Insufficient documentation

## 2015-07-09 NOTE — Patient Instructions (Signed)
Plan:  Aim for 2 Carb Choices per meal (30 grams) +/- 1 either way  Aim for 0-1 Carbs per snack if hungry  Include protein in moderation with your meals and snacks Consider reading food labels for Total Carbohydrate of foods Continue with your activity level by walking daily as tolerated Consider checking BG at alternate times per day  Continue taking medication Metformin as directed by MD

## 2015-07-09 NOTE — Progress Notes (Signed)
Diabetes Self-Management Education  Visit Type: First/Initial  Appt. Start Time: 0930 Appt. End Time: 1100  07/09/2015  [name], identified by name and date of birth, is a 74 y.o. female with a diagnosis of Diabetes: Type 2.   ASSESSMENT  Height 5\' 2"  (1.575 m), weight 159 lb 8 oz (72.349 kg). Body mass index is 29.17 kg/(m^2).      Diabetes Self-Management Education - 07/09/15 0933    Visit Information   Visit Type First/Initial   Initial Visit   Diabetes Type Type 2   Are you currently following a meal plan? Yes   What type of meal plan do you follow? no carbs, eat less   Are you taking your medications as prescribed? Yes   Date Diagnosed 06/23/15   Health Coping   How would you rate your overall health? Good   Psychosocial Assessment   Patient Belief/Attitude about Diabetes Motivated to manage diabetes  afraid   Self-care barriers None   Other persons present Patient   Patient Concerns Nutrition/Meal planning   Preferred Learning Style Auditory   How often do you need to have someone help you when you read instructions, pamphlets, or other written materials from your doctor or pharmacy? 1 - Never   What is the last grade level you completed in school? associates degree   Complications   Last HgB A1C per patient/outside source 11.3 %   How often do you check your blood sugar? 1-2 times/day   Fasting Blood glucose range (mg/dL) 130-179;70-129   Number of hypoglycemic episodes per month 0   Have you had a dilated eye exam in the past 12 months? Yes   Have you had a dental exam in the past 12 months? No   Are you checking your feet? Yes   How many days per week are you checking your feet? 7   Dietary Intake   Breakfast unsweetened cereal with 2% milk OR bacon and boiled egg with thin sliced bread   Snack (morning) not unless fresh fruit   Lunch bring from home; vegetable plate   Snack (afternoon) no   Dinner depends on schedule; PNB crackers before a meeting,  usually vegetables OR lean meat with vegetables or a salad   Snack (evening) no   Beverage(s) water, coffee black   Exercise   Exercise Type Light (walking / raking leaves)   How many days per week to you exercise? 7   How many minutes per day do you exercise? 30   Total minutes per week of exercise 210   Patient Education   Previous Diabetes Education No   Disease state  Definition of diabetes, type 1 and 2, and the diagnosis of diabetes   Nutrition management  Role of diet in the treatment of diabetes and the relationship between the three main macronutrients and blood glucose level;Food label reading, portion sizes and measuring food.;Carbohydrate counting   Physical activity and exercise  Role of exercise on diabetes management, blood pressure control and cardiac health.   Medications Reviewed patients medication for diabetes, action, purpose, timing of dose and side effects.   Monitoring Purpose and frequency of SMBG.;Identified appropriate SMBG and/or A1C goals.   Chronic complications Relationship between chronic complications and blood glucose control;Assessed and discussed foot care and prevention of foot problems   Psychosocial adjustment Role of stress on diabetes   Individualized Goals (developed by patient)   Nutrition Follow meal plan discussed   Physical Activity Exercise 3-5 times per week  Medications take my medication as prescribed   Monitoring  test blood glucose pre and post meals as discussed   Outcomes   Expected Outcomes Demonstrated interest in learning. Expect positive outcomes   Future DMSE PRN   Program Status Completed      Individualized Plan for Diabetes Self-Management Training:   Learning Objective:  Patient will have a greater understanding of diabetes self-management. Patient education plan is to attend individual and/or group sessions per assessed needs and concerns.   Plan:   Patient Instructions  Plan:  Aim for 2 Carb Choices per meal (30  grams) +/- 1 either way  Aim for 0-1 Carbs per snack if hungry  Include protein in moderation with your meals and snacks Consider reading food labels for Total Carbohydrate of foods Continue with your activity level by walking daily as tolerated Consider checking BG at alternate times per day  Continue taking medication Metformin as directed by MD      Expected Outcomes:  Demonstrated interest in learning. Expect positive outcomes  Education material provided: Living Well with Diabetes, A1C conversion sheet, Meal plan card and Carbohydrate counting sheet  If problems or questions, patient to contact team via:  Phone and Email  Future DSME appointment: PRN

## 2015-07-12 ENCOUNTER — Ambulatory Visit (INDEPENDENT_AMBULATORY_CARE_PROVIDER_SITE_OTHER): Payer: Medicare Other | Admitting: Internal Medicine

## 2015-07-12 ENCOUNTER — Encounter: Payer: Self-pay | Admitting: Internal Medicine

## 2015-07-12 VITALS — BP 132/64 | HR 60 | Temp 98.5°F | Resp 12 | Ht 62.5 in | Wt 157.2 lb

## 2015-07-12 DIAGNOSIS — E1165 Type 2 diabetes mellitus with hyperglycemia: Secondary | ICD-10-CM | POA: Diagnosis not present

## 2015-07-12 MED ORDER — METFORMIN HCL 1000 MG PO TABS: 1000.0000 mg | ORAL_TABLET | Freq: Two times a day (BID) | ORAL | Status: DC

## 2015-07-12 NOTE — Progress Notes (Signed)
Patient ID: Robin [name], female   DOB: [birth year], 74 y.o.   MRN: [id number]  HPI: Robin [name] is a 74 y.o.-year-old female, referred by her PCP, Dr. Drema Dallas, for management of DM2, initially GDM, then DM2 dx in 05/2015, insulin-dependent, uncontrolled, without complications.  Pt describes having fatigue, weight loss, polyuria, polydipsia before her diagnosis. She was seen in PCPs office on 06/28/2015  After a glucose level from 4 days prior to the visit was 481. The hemoglobin A1c obtained at the same time was 11.3%. Of note, TSH was also high at 6.6.  She does have a diagnosis of hypothyroidism.  Last hemoglobin A1c was: Lab Results  Component Value Date   HGBA1C 12.1* 06/24/2015   Pt is on a regimen of: - Metformin 500 mg 3x a day, with meals -  Started 06/28/2015.  She saw a dietitian 07/09/2015 - this helped a lot.   Pt checks her sugars 2x a day and they are decreasing dramatically - last week: - am: 86-169 - 2h after b'fast: n/c - before lunch: n/c - 2h after lunch: 135 - before dinner: 106-156, 202 - 2h after dinner: n/c - bedtime: 141 - nighttime: n/c No lows. Lowest sugar was 86; she has hypoglycemia awareness at 70.  Highest sugar was 500s.  Glucometer: One Touch  Pt's meals are: - Breakfast: cheerios + raisin bran or cornflakes + 2% milk or bacon + egg + toast - Lunch: salad or soup or sandwich - Dinner: meat + veggies + starch - Snacks: no desserts; apple, chips  She walks every day.  - no CKD, last BUN/creatinine:  Lab Results  Component Value Date   BUN 18 06/24/2015   CREATININE 1.03* 06/24/2015   - last set of lipids: Intolerant to statis: bone pain.  - last eye exam was in 06/30/2015. No DR.  - no numbness and tingling in her feet.  Pt has FH of DM in aunt.  She also has hypertension , hyperlipidemia-intolerant to statins (atorvastatin, rosuvastatin), OSA, depression, osteoarthritis, spinal stenosis, osteopenia, GERD, CKD stage III. + h/o  colon cancer.  ROS: Constitutional: no weight gain/loss, no fatigue, no subjective hyperthermia/hypothermia Eyes: no blurry vision, no xerophthalmia ENT: no sore throat, no nodules palpated in throat, no dysphagia/odynophagia, no hoarseness Cardiovascular: no CP/SOB/palpitations/leg swelling Respiratory: no cough/SOB Gastrointestinal: no N/V/D/C Musculoskeletal: no muscle/joint aches Skin: no rashes Neurological: no tremors/numbness/tingling/dizziness Psychiatric: no depression/anxiety  Past Medical History  Diagnosis Date  . Colon cancer ([place])     colon ca dx 2008 and 2010  . Hypertension   . Sleep apnea   . Arthritis   . Gestational diabetes    Past Surgical History  Procedure Laterality Date  . Back surgery  2009  . Rotator cuff repair    . Colon surgery    . Appendectomy    . Abdominal hysterectomy    . Carpal tunnel release     Social History   Social History  . Marital Status: Divorced    Spouse Name: N/A  . Number of Children: 4  . Years of Education: 12+   Occupational History  . [employer]    Social History Main Topics  . Smoking status: Never Smoker   . Smokeless tobacco: Never Used     Comment: never used tobacco  . Alcohol Use: No  . Drug Use: No  . Sexual Activity: Not on file   Other Topics Concern  . Not on file   Social History Narrative  Lives at home with herself.   Caffeine use: Drinks coffee and tea (1 cup coffee per day). Tea rarely    Current Outpatient Prescriptions on File Prior to Visit  Medication Sig Dispense Refill  . amLODipine (NORVASC) 5 MG tablet Take 5 mg by mouth Daily.    . beta carotene w/minerals (OCUVITE) tablet Take 1 tablet by mouth daily.    . Cholecalciferol (VITAMIN D-3 PO) Take 2,000 Units by mouth daily.    . cyclobenzaprine (AMRIX) 15 MG 24 hr capsule Take 1 capsule (15 mg total) by mouth every evening. 10 capsule 0  . FLUoxetine (PROZAC) 20 MG capsule Take 20 mg by mouth Daily.    . fluticasone  (FLONASE) 50 MCG/ACT nasal spray Place 1 spray into the nose as needed.     Marland Kitchen glucosamine-chondroitin 500-400 MG tablet Take 1 tablet by mouth daily.     Marland Kitchen levothyroxine (SYNTHROID, LEVOTHROID) 112 MCG tablet Take 112 mcg by mouth daily before breakfast.     . Multiple Vitamin (MULTIVITAMIN) capsule Take 1 capsule by mouth daily.    . pregabalin (LYRICA) 75 MG capsule Take 1 capsule (75 mg total) by mouth 2 (two) times daily. 60 capsule 3  . Probiotic Product (PROBIOTIC DAILY) CAPS Take 1 capsule by mouth every morning.     . triamterene-hydrochlorothiazide (DYAZIDE) 37.5-25 MG per capsule Take 1 capsule by mouth Daily.      No current facility-administered medications on file prior to visit.   Allergies  Allergen Reactions  . Statins     Aches in her bones   Family History  Problem Relation Age of Onset  . Migraines Neg Hx    PE: BP 132/64 mmHg  Pulse 60  Temp(Src) 98.5 F (36.9 C) (Oral)  Resp 12  Ht 5' 2.5" (1.588 m)  Wt 157 lb 3.2 oz (71.305 kg)  BMI 28.28 kg/m2  SpO2 95% Wt Readings from Last 3 Encounters:  07/12/15 157 lb 3.2 oz (71.305 kg)  07/09/15 159 lb 8 oz (72.349 kg)  04/29/15 167 lb (75.751 kg)   Constitutional: overweight, in NAD Eyes: PERRLA, EOMI, no exophthalmos ENT: moist mucous membranes, no thyromegaly, no cervical lymphadenopathy Cardiovascular: RRR, No MRG Respiratory: CTA B Gastrointestinal: abdomen soft, NT, ND, BS+ Musculoskeletal: + deformities ([place], Bouchard nodules), strength intact in all 4 Skin: moist, warm, no rashes Neurological: no tremor with outstretched hands, DTR normal in all 4  ASSESSMENT: 1. DM2, non-insulin-dependent, uncontrolled, without complications  PLAN:  1. Patient with newly dx'ed, uncontrolled, diabetes, on oral antidiabetic regimen (metformin) with great improvement in sugars after starting this. Sugars now mostly at target >> woill increase Metformin to target dose and will reevaluate in 1.5 months.  - we  discussed about possible chronic and acute DM complications, how to treat hypo and hyperglycemia, and when to alert [place]. - she already improved her diet, but I believe cereals in am are still pushing her sugars up. She continues to work on this. - I suggested to:  Patient Instructions  Please return in 1.5 months with your sugar log.   Please increase Metformin to 1000 mg 2x a day with meals.  - Strongly advised her to start checking sugars at different times of the day - check 2 times a day, rotating checks - given sugar log and advised how to fill it and to bring it at next appt  - given foot care handout and explained the principles  - given instructions for hypoglycemia management "15-15 rule"  -  advised for yearly eye exams >> she is UTD - Return to clinic in 1.5 mo with sugar log

## 2015-07-12 NOTE — Patient Instructions (Signed)
Please return in 1.5 months with your sugar log.   Please increase Metformin to 1000 mg 2x a day with meals.  PATIENT INSTRUCTIONS FOR TYPE 2 DIABETES:  **Please join MyChart!** - see attached instructions about how to join if you have not done so already.  DIET AND EXERCISE Diet and exercise is an important part of diabetic treatment.  We recommended aerobic exercise in the form of brisk walking (working between 40-60% of maximal aerobic capacity, similar to brisk walking) for 150 minutes per week (such as 30 minutes five days per week) along with 3 times per week performing 'resistance' training (using various gauge rubber tubes with handles) 5-10 exercises involving the major muscle groups (upper body, lower body and core) performing 10-15 repetitions (or near fatigue) each exercise. Start at half the above goal but build slowly to reach the above goals. If limited by weight, joint pain, or disability, we recommend daily walking in a swimming pool with water up to waist to reduce pressure from joints while allow for adequate exercise.    BLOOD GLUCOSES Monitoring your blood glucoses is important for continued management of your diabetes. Please check your blood glucoses 2-4 times a day: fasting, before meals and at bedtime (you can rotate these measurements - e.g. one day check before the 3 meals, the next day check before 2 of the meals and before bedtime, etc.).   HYPOGLYCEMIA (low blood sugar) Hypoglycemia is usually a reaction to not eating, exercising, or taking too much insulin/ other diabetes drugs.  Symptoms include tremors, sweating, hunger, confusion, headache, etc. Treat IMMEDIATELY with 15 grams of Carbs: . 4 glucose tablets .  cup regular juice/soda . 2 tablespoons raisins . 4 teaspoons sugar . 1 tablespoon honey Recheck blood glucose in 15 mins and repeat above if still symptomatic/blood glucose <100.  RECOMMENDATIONS TO REDUCE YOUR RISK OF DIABETIC COMPLICATIONS: * Take  your prescribed MEDICATION(S) * Follow a DIABETIC diet: Complex carbs, fiber rich foods, (monounsaturated and polyunsaturated) fats * AVOID saturated/trans fats, high fat foods, >2,300 mg salt per day. * EXERCISE at least 5 times a week for 30 minutes or preferably daily.  * DO NOT SMOKE OR DRINK more than 1 drink a day. * Check your FEET every day. Do not wear tightfitting shoes. Contact us if you develop an ulcer * See your EYE doctor once a year or more if needed * Get a FLU shot once a year * Get a PNEUMONIA vaccine once before and once after age 19 years  GOALS:  * Your Hemoglobin A1c of <7%  * fasting sugars need to be <130 * after meals sugars need to be <180 (2h after you start eating) * Your Systolic BP should be 962 or lower  * Your Diastolic BP should be 80 or lower  * Your HDL (Good Cholesterol) should be 40 or higher  * Your LDL (Bad Cholesterol) should be 100 or lower. * Your Triglycerides should be 150 or lower  * Your Urine microalbumin (kidney function) should be <30 * Your Body Mass Index should be 25 or lower    Please consider the following ways to cut down carbs and fat and increase fiber and micronutrients in your diet: - substitute whole grain for white bread or pasta - substitute brown rice for white rice - substitute 90-calorie flat bread pieces for slices of bread when possible - substitute sweet potatoes or yams for white potatoes - substitute humus for margarine - substitute tofu for cheese when  possible - substitute almond or rice milk for regular milk (would not drink soy milk daily due to concern for soy estrogen influence on breast cancer risk) - substitute dark chocolate for other sweets when possible - substitute water - can add lemon or orange slices for taste - for diet sodas (artificial sweeteners will trick your body that you can eat sweets without getting calories and will lead you to overeating and weight gain in the long run) - do not skip  breakfast or other meals (this will slow down the metabolism and will result in more weight gain over time)  - can try smoothies made from fruit and almond/rice milk in am instead of regular breakfast - can also try old-fashioned (not instant) oatmeal made with almond/rice milk in am - order the dressing on the side when eating salad at a restaurant (pour less than half of the dressing on the salad) - eat as little meat as possible - can try juicing, but should not forget that juicing will get rid of the fiber, so would alternate with eating raw veg./fruits or drinking smoothies - use as little oil as possible, even when using olive oil - can dress a salad with a mix of balsamic vinegar and lemon juice, for e.g. - use agave nectar, stevia sugar, or regular sugar rather than artificial sweateners - steam or broil/roast veggies  - snack on veggies/fruit/nuts (unsalted, preferably) when possible, rather than processed foods - reduce or eliminate aspartame in diet (it is in diet sodas, chewing gum, etc) Read the labels!  Try to read Dr. Janene Harvey book: "Program for Reversing Diabetes" for other ideas for healthy eating.

## 2015-07-22 ENCOUNTER — Ambulatory Visit: Payer: Medicare Other

## 2015-08-04 ENCOUNTER — Other Ambulatory Visit: Payer: Self-pay | Admitting: Gastroenterology

## 2015-08-05 ENCOUNTER — Encounter (HOSPITAL_COMMUNITY): Payer: Self-pay | Admitting: *Deleted

## 2015-08-10 ENCOUNTER — Ambulatory Visit (HOSPITAL_COMMUNITY): Payer: Medicare Other | Admitting: Anesthesiology

## 2015-08-10 ENCOUNTER — Encounter (HOSPITAL_COMMUNITY): Payer: Self-pay

## 2015-08-10 ENCOUNTER — Ambulatory Visit (HOSPITAL_COMMUNITY)
Admission: RE | Admit: 2015-08-10 | Discharge: 2015-08-10 | Disposition: A | Discharge status: Home / Self Care | Payer: Medicare Other | Source: Ambulatory Visit | Attending: Gastroenterology | Admitting: Gastroenterology

## 2015-08-10 ENCOUNTER — Encounter (HOSPITAL_COMMUNITY)
Admission: RE | Disposition: A | Discharge status: Home / Self Care | Payer: Self-pay | Source: Ambulatory Visit | Attending: Gastroenterology

## 2015-08-10 DIAGNOSIS — Z79899 Other long term (current) drug therapy: Secondary | ICD-10-CM | POA: Diagnosis not present

## 2015-08-10 DIAGNOSIS — I10 Essential (primary) hypertension: Secondary | ICD-10-CM | POA: Diagnosis not present

## 2015-08-10 DIAGNOSIS — M4800 Spinal stenosis, site unspecified: Secondary | ICD-10-CM | POA: Diagnosis not present

## 2015-08-10 DIAGNOSIS — R1310 Dysphagia, unspecified: Secondary | ICD-10-CM | POA: Diagnosis not present

## 2015-08-10 DIAGNOSIS — Z9842 Cataract extraction status, left eye: Secondary | ICD-10-CM | POA: Insufficient documentation

## 2015-08-10 DIAGNOSIS — Z9841 Cataract extraction status, right eye: Secondary | ICD-10-CM | POA: Insufficient documentation

## 2015-08-10 DIAGNOSIS — R634 Abnormal weight loss: Secondary | ICD-10-CM | POA: Insufficient documentation

## 2015-08-10 DIAGNOSIS — Z7984 Long term (current) use of oral hypoglycemic drugs: Secondary | ICD-10-CM | POA: Insufficient documentation

## 2015-08-10 DIAGNOSIS — E1165 Type 2 diabetes mellitus with hyperglycemia: Secondary | ICD-10-CM | POA: Insufficient documentation

## 2015-08-10 DIAGNOSIS — Z85038 Personal history of other malignant neoplasm of large intestine: Secondary | ICD-10-CM | POA: Insufficient documentation

## 2015-08-10 DIAGNOSIS — Z6827 Body mass index (BMI) 27.0-27.9, adult: Secondary | ICD-10-CM | POA: Diagnosis not present

## 2015-08-10 DIAGNOSIS — R195 Other fecal abnormalities: Secondary | ICD-10-CM | POA: Insufficient documentation

## 2015-08-10 DIAGNOSIS — G4730 Sleep apnea, unspecified: Secondary | ICD-10-CM | POA: Insufficient documentation

## 2015-08-10 DIAGNOSIS — M1990 Unspecified osteoarthritis, unspecified site: Secondary | ICD-10-CM | POA: Insufficient documentation

## 2015-08-10 HISTORY — PX: ESOPHAGOGASTRODUODENOSCOPY (EGD) WITH PROPOFOL: SHX5813

## 2015-08-10 HISTORY — DX: Adverse effect of unspecified anesthetic, initial encounter: T41.45XA

## 2015-08-10 HISTORY — DX: Headache: R51

## 2015-08-10 HISTORY — DX: Other complications of anesthesia, initial encounter: T88.59XA

## 2015-08-10 HISTORY — PX: COLONOSCOPY WITH PROPOFOL: SHX5780

## 2015-08-10 HISTORY — DX: Headache, unspecified: R51.9

## 2015-08-10 LAB — GLUCOSE, CAPILLARY: Glucose-Capillary: 104 mg/dL — ABNORMAL HIGH (ref 65–99)

## 2015-08-10 SURGERY — COLONOSCOPY WITH PROPOFOL
Anesthesia: Monitor Anesthesia Care

## 2015-08-10 MED ORDER — PROPOFOL 500 MG/50ML IV EMUL
INTRAVENOUS | Status: DC | PRN
  Administered 2015-08-10: 40 mg via INTRAVENOUS

## 2015-08-10 MED ORDER — PROPOFOL 10 MG/ML IV BOLUS
INTRAVENOUS | Status: AC
  Filled 2015-08-10: qty 20

## 2015-08-10 MED ORDER — SODIUM CHLORIDE 0.9 % IV SOLN: INTRAVENOUS | Status: DC

## 2015-08-10 MED ORDER — PROPOFOL 500 MG/50ML IV EMUL
INTRAVENOUS | Status: DC | PRN
  Administered 2015-08-10: 140 ug/kg/min via INTRAVENOUS

## 2015-08-10 MED ORDER — LIDOCAINE HCL (CARDIAC) 20 MG/ML IV SOLN
INTRAVENOUS | Status: AC
  Filled 2015-08-10: qty 5

## 2015-08-10 MED ORDER — LACTATED RINGERS IV SOLN
INTRAVENOUS | Status: DC
  Administered 2015-08-10: 1000 mL via INTRAVENOUS

## 2015-08-10 SURGICAL SUPPLY — 25 items

## 2015-08-10 NOTE — Anesthesia Preprocedure Evaluation (Addendum)
Anesthesia Evaluation  Patient identified by MRN, date of birth, ID band Patient awake    Reviewed: Allergy & Precautions, NPO status , Patient's Chart, lab work & pertinent test results  Airway Mallampati: I  TM Distance: >3 FB Neck ROM: Full    Dental   Pulmonary sleep apnea and Continuous Positive Airway Pressure Ventilation ,    Pulmonary exam normal        Cardiovascular hypertension, Pt. on medications Normal cardiovascular exam     Neuro/Psych    GI/Hepatic   Endo/Other  diabetes, Type 2, Oral Hypoglycemic Agents  Renal/GU      Musculoskeletal   Abdominal   Peds  Hematology   Anesthesia Other Findings   Reproductive/Obstetrics                            Anesthesia Physical Anesthesia Plan  ASA: III  Anesthesia Plan: MAC   Post-op Pain Management:    Induction: Intravenous  Airway Management Planned: Simple Face Mask  Additional Equipment:   Intra-op Plan:   Post-operative Plan:   Informed Consent: I have reviewed the patients History and Physical, chart, labs and discussed the procedure including the risks, benefits and alternatives for the proposed anesthesia with the patient or authorized representative who has indicated his/her understanding and acceptance.     Plan Discussed with: CRNA and Surgeon  Anesthesia Plan Comments:         Anesthesia Quick Evaluation

## 2015-08-10 NOTE — Transfer of Care (Signed)
Immediate Anesthesia Transfer of Care Note  Patient: Robin [name]  Procedure(s) Performed: Procedure(s): COLONOSCOPY WITH PROPOFOL (N/A) ESOPHAGOGASTRODUODENOSCOPY (EGD) WITH PROPOFOL (N/A)  Patient Location: PACU  Anesthesia Type:MAC  Level of Consciousness: awake, alert  and oriented  Airway & Oxygen Therapy: Patient Spontanous Breathing and Patient connected to nasal cannula oxygen  Post-op Assessment: Report given to RN and Post -op Vital signs reviewed and stable  Post vital signs: Reviewed and stable  Last Vitals:  Filed Vitals:   08/10/15 0635  BP: 163/63  Pulse: 55  Temp: 37.1 C  Resp: 12    Complications: No apparent anesthesia complications

## 2015-08-10 NOTE — H&P (Signed)
  Problems: Esophageal dysphagia. Unintentional weight loss. History of colon cancer. Uncontrolled type 2 diabetes mellitus. Positive cologuard stool DNA colon cancer screening test. Descending colon cancer surgeries performed in 2008 and 2010. Normal esophagogastroduodenoscopy performed in November 2010. Normal surveillance colonoscopy performed in April 2011. Normal surveillance colonoscopy performed in September 2014. 09/26/2013 abdominal ultrasound shows hepatic steatosis.  History: The patient is a 74 year old female born [birth year]. She is undergoing diagnostic colonoscopy to evaluate a positive cologuard stool DNA colon cancer screening test.  When she swallows certain pills, she feels as though the pills get stuck in her cervical esophagus. She underwent a normal diagnostic esophagogastroduodenoscopy in November 2010.  She is scheduled to undergo diagnostic esophagogastroduodenoscopy with possible esophageal stricture dilation today.  Past medical history: Gastroesophageal reflux. Hypercholesterolemia. Obstructive sleep apnea syndrome. Uncontrolled type 2 diabetes mellitus. Hypothyroidism. Hypertension. Depression. Osteoarthritis. Allergic rhinitis. Spinal stenosis. Colon cancer surgeries performed in 2008 and 2010. Hepatic steatosis by abdominal ultrasound. Appendectomy. TAH-BSO. Bilateral cataract surgeries. Lumbar laminectomy. Right thumb surgery.  Medication allergies: Celebrex. ACE inhibitors. Crestor. Lipitor.  Exam: The patient is alert and lying comfortably on the endoscopy stretcher. Abdomen is soft and nontender to palpation. Cardiac exam reveals a regular rhythm. Lungs are clear to auscultation.  Plan:  #1. Proceed with diagnostic colonoscopy to evaluate positive cologuard stool DNA colon cancer screening test  #2. Proceed with diagnostic esophagogastroduodenoscopy with possible esophageal dilation to evaluate cervical esophageal dysphagia

## 2015-08-10 NOTE — Op Note (Signed)
Problem: Intermittent cervical esophageal dysphagia. Normal esophagogastroduodenoscopy performed in November 2010.  Endoscopist: Earle Gell  Premedication: Propofol administered by anesthesia  Procedure: Diagnostic esophagogastroduodenoscopy The patient was placed in the left lateral decubitus position. The Pentax gastroscope was passed through the posterior hypopharynx into the proximal esophagus without difficulty. The hypopharynx, larynx, and vocal appeared normal.  Esophagoscopy: The proximal, mid, and lower segments of the esophageal mucosa appeared normal. The squamocolumnar junction was regular in appearance and noted at 40 cm from the incisor teeth. There was no endoscopic evidence for the presence of erosive esophagitis, there is esophagus, or esophageal stricture formation, or esophageal obstruction. There were no endoscopic esophageal mucosal signs to suggest eosinophilic esophagitis.  Gastroscopy: Retroflex view of the gastric cardia and fundus was normal. The gastric body, antrum, and pylorus appeared normal.  Duodenoscopy: The duodenal bulb and descending duodenum appeared normal.  Assessment: Normal esophagogastroduodenoscopy.  Problem: Positive cologuard stool DNA colon cancer screening test. Descending colon cancer surgeries performed in 2008 and in 2010. Normal surveillance colonoscopy performed in April 2011. Normal surveillance colonoscopy performed in September 2014  Procedure: Diagnostic colonoscopy Anal inspection and digital rectal exam were normal. The Pentax pediatric colonoscope was introduced into the rectum and advanced to the cecum. A normal-appearing ileocecal valve and appendiceal orifice were identified. Colonic preparation for the exam today was good. Withdrawal time was 8 minutes  Rectum. Normal. Retroflexed view of the distal rectum was normal  Sigmoid colon and descending colon. Normal post descending colon cancer surgeries performed in 2008 and  2010  Splenic flexure. Normal  Transverse colon. Normal  Hepatic flexure. Normal  Ascending colon. Normal  Cecum and ileocecal valve. Normal  Assessment: Normal surveillance colonoscopy post descending colon cancer surgeries performed in 2008 and 2010

## 2015-08-10 NOTE — Discharge Instructions (Signed)
Esophagogastroduodenoscopy, Care After Refer to this sheet in the next few weeks. These instructions provide you with information about caring for yourself after your procedure. Your health care provider may also give you more specific instructions. Your treatment has been planned according to current medical practices, but problems sometimes occur. Call your health care provider if you have any problems or questions after your procedure. WHAT TO EXPECT AFTER THE PROCEDURE After your procedure, it is typical to feel:  Soreness in your throat.  Pain with swallowing.  Sick to your stomach (nauseous).  Bloated.  Dizzy.  Fatigued. HOME CARE INSTRUCTIONS  Do not eat or drink anything until the numbing medicine (local anesthetic) has worn off and your gag reflex has returned. You will know that the local anesthetic has worn off when you can swallow comfortably.  Do not drive or operate machinery until directed by your health care provider.  Take medicines only as directed by your health care provider. SEEK MEDICAL CARE IF:   You cannot stop coughing.  You are not urinating at all or less than usual. SEEK IMMEDIATE MEDICAL CARE IF:  You have difficulty swallowing.  You cannot eat or drink.  You have worsening throat or chest pain.  You have dizziness or lightheadedness or you faint.  You have nausea or vomiting.  You have chills.  You have a fever.  You have severe abdominal pain.  You have black, tarry, or bloody stools.   This information is not intended to replace advice given to you by your health care provider. Make sure you discuss any questions you have with your health care provider.   Document Released: 08/28/2012 Document Revised: 10/02/2014 Document Reviewed: 08/28/2012 Elsevier Interactive Patient Education 2016 [employer]. Colonoscopy, Care After These instructions give you information on caring for yourself after your procedure. Your doctor may also give  you more specific instructions. Call your doctor if you have any problems or questions after your procedure. HOME CARE  Do not drive for 24 hours.  Do not sign important papers or use machinery for 24 hours.  You may shower.  You may go back to your usual activities, but go slower for the first 24 hours.  Take rest breaks often during the first 24 hours.  Walk around or use warm packs on your belly (abdomen) if you have belly cramping or gas.  Drink enough fluids to keep your pee (urine) clear or pale yellow.  Resume your normal diet. Avoid heavy or fried foods.  Avoid drinking alcohol for 24 hours or as told by your doctor.  Only take medicines as told by your doctor. If a tissue sample (biopsy) was taken during the procedure:   Do not take aspirin or blood thinners for 7 days, or as told by your doctor.  Do not drink alcohol for 7 days, or as told by your doctor.  Eat soft foods for the first 24 hours. GET HELP IF: You still have a small amount of blood in your poop (stool) 2-3 days after the procedure. GET HELP RIGHT AWAY IF:  You have more than a small amount of blood in your poop.  You see clumps of tissue (blood clots) in your poop.  Your belly is puffy (swollen).  You feel sick to your stomach (nauseous) or throw up (vomit).  You have a fever.  You have belly pain that gets worse and medicine does not help. MAKE SURE YOU:  Understand these instructions.  Will watch your condition.  Will  get help right away if you are not doing well or get worse.   This information is not intended to replace advice given to you by your health care provider. Make sure you discuss any questions you have with your health care provider.   Document Released: 10/14/2010 Document Revised: 09/16/2013 Document Reviewed: 05/19/2013 Elsevier Interactive Patient Education [employer].

## 2015-08-10 NOTE — Anesthesia Postprocedure Evaluation (Signed)
Anesthesia Post Note  Patient: Robin [name]  Procedure(s) Performed: Procedure(s) (LRB): COLONOSCOPY WITH PROPOFOL (N/A) ESOPHAGOGASTRODUODENOSCOPY (EGD) WITH PROPOFOL (N/A)  Anesthesia type: MAC  Patient location: PACU  Post pain: Pain level controlled  Post assessment: Patient's Cardiovascular Status Stable  Last Vitals:  Filed Vitals:   08/10/15 0830  BP: 188/70  Pulse: 49  Temp:   Resp: 18    Post vital signs: Reviewed and stable  Level of consciousness: sedated  Complications: No apparent anesthesia complications

## 2015-08-11 ENCOUNTER — Encounter (HOSPITAL_COMMUNITY): Payer: Self-pay | Admitting: Gastroenterology

## 2015-08-26 ENCOUNTER — Ambulatory Visit
Admission: RE | Admit: 2015-08-26 | Discharge: 2015-08-26 | Disposition: A | Discharge status: Home / Self Care | Payer: Medicare Other | Source: Ambulatory Visit

## 2015-08-26 DIAGNOSIS — Z1231 Encounter for screening mammogram for malignant neoplasm of breast: Secondary | ICD-10-CM

## 2015-09-08 ENCOUNTER — Ambulatory Visit (INDEPENDENT_AMBULATORY_CARE_PROVIDER_SITE_OTHER): Payer: Medicare Other | Admitting: Internal Medicine

## 2015-09-08 ENCOUNTER — Other Ambulatory Visit (INDEPENDENT_AMBULATORY_CARE_PROVIDER_SITE_OTHER): Payer: Medicare Other | Admitting: *Deleted

## 2015-09-08 ENCOUNTER — Encounter: Payer: Self-pay | Admitting: Internal Medicine

## 2015-09-08 VITALS — BP 122/64 | HR 58 | Temp 98.0°F | Resp 12 | Wt 154.4 lb

## 2015-09-08 DIAGNOSIS — E1165 Type 2 diabetes mellitus with hyperglycemia: Secondary | ICD-10-CM | POA: Diagnosis not present

## 2015-09-08 LAB — POCT GLYCOSYLATED HEMOGLOBIN (HGB A1C): HEMOGLOBIN A1C: 6.1

## 2015-09-08 NOTE — Progress Notes (Signed)
Patient ID: Robin [name], female   DOB: [birth year], 74 y.o.   MRN: [id number]  HPI: Robin [name] is a 74 y.o.-year-old female, initially referred by her PCP, Dr. Drema Dallas, returning for follow-up for DM2, initially GDM, then DM2 dx in 05/2015, insulin-dependent, uncontrolled, without complications.  Pt describes having fatigue, weight loss, polyuria, polydipsia before her diagnosis. She was seen in PCPs office on 06/28/2015  After a glucose level from 4 days prior to the visit was 481. The hemoglobin A1c obtained at the same time was 11.3%. Of note, TSH was also high at 6.6.  She does have a diagnosis of hypothyroidism.  Last hemoglobin A1c was: Lab Results  Component Value Date   HGBA1C 12.1* 06/24/2015   Pt is on a regimen of: - Metformin 1000 mg 2x a day, with meals  She saw a dietitian 07/09/2015 - this helped a lot.   Pt checks her sugars 2x a day >> excellent sugars: - am: 86-169 >> 83-122 - 2h after b'fast: n/c - before lunch: n/c - 2h after lunch: 135 >> n/c - before dinner: 106-156, 202 >> 82-109, 128, 144 - 2h after dinner: n/c - bedtime: 141 >> 114-136 - nighttime: n/c No lows. Lowest sugar was 86; she has hypoglycemia awareness at 70.  Highest sugar was 500s.  Glucometer: One Touch  Pt's meals are: - Breakfast: cheerios + raisin bran or cornflakes + 2% milk or bacon + egg + toast - Lunch: salad or soup or sandwich - Dinner: meat + veggies + starch - Snacks: no desserts; apple, chips  She walks every day.  - no CKD, last BUN/creatinine:  Lab Results  Component Value Date   BUN 18 06/24/2015   CREATININE 1.03* 06/24/2015   - last set of lipids: Intolerant to statis: bone pain.  - last eye exam was in 06/30/2015. No DR.  - no numbness and tingling in her feet.  She also has hypertension , hyperlipidemia-intolerant to statins (atorvastatin, rosuvastatin), OSA, depression, osteoarthritis, spinal stenosis, osteopenia, GERD, CKD stage III. + h/o colon  cancer.  ROS: Constitutional: no weight gain/loss, no fatigue, no subjective hyperthermia/hypothermia Eyes: no blurry vision, no xerophthalmia ENT: no sore throat, no nodules palpated in throat, no dysphagia/odynophagia, no hoarseness Cardiovascular: no CP/SOB/palpitations/leg swelling Respiratory: no cough/SOB Gastrointestinal: no N/V/D/C Musculoskeletal: no muscle/joint aches Skin: no rashes Neurological: no tremors/numbness/tingling/dizziness  I reviewed pt's medications, allergies, PMH, social hx, family hx, and changes were documented in the history of present illness. Otherwise, unchanged from my initial visit note.  Past Medical History  Diagnosis Date  . Colon cancer ([place])     colon ca dx 2008 and 2010  . Hypertension   . Arthritis   . Gestational diabetes   . Complication of anesthesia     problems waking up-patient thinks she was given too much medication  . Sleep apnea     uses CPAP  . Headache     headaches all the time   Past Surgical History  Procedure Laterality Date  . Back surgery  2009  . Rotator cuff repair    . Colon surgery    . Appendectomy    . Abdominal hysterectomy    . Carpal tunnel release    . Eye surgery      bilateral cataract surgery   . Dilation and curettage of uterus      had before had hysterectomy  . Colonoscopy with propofol N/A 08/10/2015    Procedure: COLONOSCOPY WITH PROPOFOL;  Surgeon: Hassell Done  Sandria Senter, MD;  Location: [facility];  Service: Endoscopy;  Laterality: N/A;  . Esophagogastroduodenoscopy (egd) with propofol N/A 08/10/2015    Procedure: ESOPHAGOGASTRODUODENOSCOPY (EGD) WITH PROPOFOL;  Surgeon: Garlan Fair, MD;  Location: [facility];  Service: Endoscopy;  Laterality: N/A;   Social History   Social History  . Marital Status: Divorced    Spouse Name: N/A  . Number of Children: 4  . Years of Education: 12+   Occupational History  . [employer]    Social History Main Topics  . Smoking status: Never  Smoker   . Smokeless tobacco: Never Used     Comment: never used tobacco  . Alcohol Use: No  . Drug Use: No  . Sexual Activity: Not on file   Other Topics Concern  . Not on file   Social History Narrative   Lives at home with herself.   Caffeine use: Drinks coffee and tea (1 cup coffee per day). Tea rarely    Current Outpatient Prescriptions on File Prior to Visit  Medication Sig Dispense Refill  . amLODipine (NORVASC) 5 MG tablet Take 5 mg by mouth Daily.    . beta carotene w/minerals (OCUVITE) tablet Take 1 tablet by mouth daily.    . Cholecalciferol (VITAMIN D-3 PO) Take 2,000 Units by mouth daily.    Marland Kitchen FLUoxetine (PROZAC) 20 MG capsule Take 20 mg by mouth Daily.    . fluticasone (FLONASE) 50 MCG/ACT nasal spray Place 1 spray into the nose 2 (two) times daily as needed for allergies or rhinitis.     Marland Kitchen GLUCOSAMINE HCL PO Take 1 tablet by mouth daily.    Marland Kitchen levothyroxine (SYNTHROID, LEVOTHROID) 112 MCG tablet Take 112 mcg by mouth daily before breakfast.     . metFORMIN (GLUCOPHAGE) 1000 MG tablet Take 1 tablet (1,000 mg total) by mouth 2 (two) times daily with a meal. 60 tablet 5  . Multiple Vitamin (MULTIVITAMIN) capsule Take 1 capsule by mouth daily.    . polyvinyl alcohol (LIQUIFILM TEARS) 1.4 % ophthalmic solution Place 1 drop into both eyes as needed for dry eyes.    . Probiotic Product (PROBIOTIC DAILY) CAPS Take 1 capsule by mouth every morning.     . triamterene-hydrochlorothiazide (DYAZIDE) 37.5-25 MG per capsule Take 1 capsule by mouth Daily.      No current facility-administered medications on file prior to visit.   Allergies  Allergen Reactions  . Statins     Aches in her bones   Family History  Problem Relation Age of Onset  . Migraines Neg Hx    PE: BP 122/64 mmHg  Pulse 58  Temp(Src) 98 F (36.7 C) (Oral)  Resp 12  Wt 154 lb 6.4 oz (70.035 kg)  SpO2 98% Body mass index is 27.77 kg/(m^2). Wt Readings from Last 3 Encounters:  09/08/15 154 lb 6.4 oz  (70.035 kg)  08/10/15 152 lb (68.947 kg)  07/12/15 157 lb 3.2 oz (71.305 kg)   Constitutional: overweight, in NAD Eyes: PERRLA, EOMI, no exophthalmos ENT: moist mucous membranes, no thyromegaly, no cervical lymphadenopathy Cardiovascular: RRR, No MRG Respiratory: CTA B Gastrointestinal: abdomen soft, NT, ND, BS+ Musculoskeletal: + deformities ([place], Bouchard nodules), strength intact in all 4 Skin: moist, warm, no rashes Neurological: no tremor with outstretched hands, DTR normal in all 4  ASSESSMENT: 1. DM2, non-insulin-dependent, uncontrolled, without complications  PLAN:  1. Patient with recently dx'ed, uncontrolled, diabetes, on oral antidiabetic regimen (metformin) with great improvement in sugars after starting this. At last visit,  we increased Metformin to target dose. Today her sugars are all at goal >> HbA1c 6.1%! (amazing improvement) - I suggested to:  Patient Instructions  Please continue Metformin 1000 mg 2x a day with meals. Please return in 3 months with your sugar log.   - continue checking sugars at different times of the day - check 2 times a day, rotating checks - UTD with flu shot - advised for yearly eye exams >> she is UTD - Return to clinic in 3 mo with sugar log

## 2015-09-08 NOTE — Patient Instructions (Signed)
Patient Instructions  Please continue Metformin 1000 mg 2x a day with meals. Please return in 3 months with your sugar log.   KEEP UP THE GREAT WORK!!!!

## 2015-09-29 ENCOUNTER — Encounter: Payer: Self-pay | Admitting: *Deleted

## 2015-09-29 NOTE — Progress Notes (Signed)
Pt did not pick up printed rx lyrica dated 03-07-2015. Shredded.

## 2015-11-01 ENCOUNTER — Other Ambulatory Visit (HOSPITAL_BASED_OUTPATIENT_CLINIC_OR_DEPARTMENT_OTHER): Payer: Medicare Other

## 2015-11-01 ENCOUNTER — Ambulatory Visit (HOSPITAL_BASED_OUTPATIENT_CLINIC_OR_DEPARTMENT_OTHER): Payer: Medicare Other | Admitting: Hematology & Oncology

## 2015-11-01 ENCOUNTER — Encounter: Payer: Self-pay | Admitting: Hematology & Oncology

## 2015-11-01 VITALS — BP 151/54 | HR 56 | Temp 98.1°F | Resp 16 | Ht 62.0 in | Wt 154.0 lb

## 2015-11-01 DIAGNOSIS — E785 Hyperlipidemia, unspecified: Secondary | ICD-10-CM

## 2015-11-01 DIAGNOSIS — C184 Malignant neoplasm of transverse colon: Secondary | ICD-10-CM

## 2015-11-01 DIAGNOSIS — Z85038 Personal history of other malignant neoplasm of large intestine: Secondary | ICD-10-CM

## 2015-11-01 DIAGNOSIS — E559 Vitamin D deficiency, unspecified: Secondary | ICD-10-CM

## 2015-11-01 DIAGNOSIS — C189 Malignant neoplasm of colon, unspecified: Secondary | ICD-10-CM

## 2015-11-01 DIAGNOSIS — E1169 Type 2 diabetes mellitus with other specified complication: Secondary | ICD-10-CM

## 2015-11-01 LAB — CBC WITH DIFFERENTIAL (CANCER CENTER ONLY)
BASO#: 0 10*3/uL (ref 0.0–0.2)
BASO%: 0.4 % (ref 0.0–2.0)
EOS ABS: 0.4 10*3/uL (ref 0.0–0.5)
EOS%: 6.4 % (ref 0.0–7.0)
HCT: 34.3 % — ABNORMAL LOW (ref 34.8–46.6)
HEMOGLOBIN: 11.6 g/dL (ref 11.6–15.9)
LYMPH#: 2 10*3/uL (ref 0.9–3.3)
LYMPH%: 36.8 % (ref 14.0–48.0)
MCH: 30.4 pg (ref 26.0–34.0)
MCHC: 33.8 g/dL (ref 32.0–36.0)
MCV: 90 fL (ref 81–101)
MONO#: 0.5 10*3/uL (ref 0.1–0.9)
MONO%: 9.3 % (ref 0.0–13.0)
NEUT%: 47.1 % (ref 39.6–80.0)
NEUTROS ABS: 2.6 10*3/uL (ref 1.5–6.5)
Platelets: 162 10*3/uL (ref 145–400)
RBC: 3.81 10*6/uL (ref 3.70–5.32)
RDW: 12 % (ref 11.1–15.7)
WBC: 5.5 10*3/uL (ref 3.9–10.0)

## 2015-11-01 LAB — CMP (CANCER CENTER ONLY)
ALBUMIN: 3.8 g/dL (ref 3.3–5.5)
ALT(SGPT): 31 U/L (ref 10–47)
AST: 27 U/L (ref 11–38)
Alkaline Phosphatase: 33 U/L (ref 26–84)
BILIRUBIN TOTAL: 0.8 mg/dL (ref 0.20–1.60)
BUN, Bld: 14 mg/dL (ref 7–22)
CO2: 27 meq/L (ref 18–33)
CREATININE: 0.9 mg/dL (ref 0.6–1.2)
Calcium: 9.4 mg/dL (ref 8.0–10.3)
Chloride: 100 mEq/L (ref 98–108)
Glucose, Bld: 109 mg/dL (ref 73–118)
Potassium: 3.4 mEq/L (ref 3.3–4.7)
SODIUM: 135 meq/L (ref 128–145)
TOTAL PROTEIN: 6.7 g/dL (ref 6.4–8.1)

## 2015-11-01 NOTE — Progress Notes (Signed)
Hematology and Oncology Follow Up Visit  Robin [name] [id number] [birth year] 75 y.o. 11/01/2015   Principle Diagnosis:  History of locally recurrent adenocarcinoma of the colon  Current Therapy:    Observation     Interim History:  [name] is back for follow-up. We see her every 6 months. Since we last saw, she's been doing pretty well.   She and her family at a time over the holidays. They did enjoy the snow that we had about a month ago.  She's had no issues with nausea or vomiting. There's been no abdominal pain. She's had no change in bowel or bladder habits. She's had no cough. She's had no leg swelling.   Her last CEA level done back in August was 2.8.  Overall, her performance status is ECOG 1.  Medications:  Current outpatient prescriptions:  .  amLODipine (NORVASC) 5 MG tablet, Take 5 mg by mouth Daily., Disp: , Rfl:  .  beta carotene w/minerals (OCUVITE) tablet, Take 1 tablet by mouth daily., Disp: , Rfl:  .  Cholecalciferol (VITAMIN D-3 PO), Take 2,000 Units by mouth daily., Disp: , Rfl:  .  FLUoxetine (PROZAC) 20 MG capsule, Take 20 mg by mouth Daily., Disp: , Rfl:  .  fluticasone (FLONASE) 50 MCG/ACT nasal spray, Place 1 spray into the nose 2 (two) times daily as needed for allergies or rhinitis. , Disp: , Rfl:  .  GAVILYTE-N WITH FLAVOR PACK 420 g solution, , Disp: , Rfl:  .  GLUCOSAMINE HCL PO, Take 1 tablet by mouth daily., Disp: , Rfl:  .  levothyroxine (SYNTHROID, LEVOTHROID) 112 MCG tablet, Take 112 mcg by mouth daily before breakfast. , Disp: , Rfl:  .  metFORMIN (GLUCOPHAGE) 1000 MG tablet, Take 1 tablet (1,000 mg total) by mouth 2 (two) times daily with a meal., Disp: 60 tablet, Rfl: 5 .  Multiple Vitamin (MULTIVITAMIN) capsule, Take 1 capsule by mouth daily., Disp: , Rfl:  .  ONETOUCH DELICA LANCETS [id number] MISC, , Disp: , Rfl:  .  ONETOUCH VERIO test strip, , Disp: , Rfl:  .  polyvinyl alcohol (LIQUIFILM TEARS) 1.4 % ophthalmic solution, Place 1 drop  into both eyes as needed for dry eyes., Disp: , Rfl:  .  Probiotic Product (PROBIOTIC DAILY) CAPS, Take 1 capsule by mouth every morning. , Disp: , Rfl:  .  triamterene-hydrochlorothiazide (DYAZIDE) 37.5-25 MG per capsule, Take 1 capsule by mouth Daily. , Disp: , Rfl:   Allergies:  Allergies  Allergen Reactions  . Statins     Aches in her bones    Past Medical History, Surgical history, Social history, and Family History were reviewed and updated.  Review of Systems: As above  Physical Exam:  height is 5\' 2"  (1.575 m) and weight is 154 lb (69.854 kg). Her oral temperature is 98.1 F (36.7 C). Her blood pressure is 151/54 and her pulse is 56. Her respiration is 16.   Well-developed and well-nourished white female in no obvious distress. Head and neck exam shows no ocular or oral lesions. She has no adenopathy in the neck. Lungs are clear. Cardiac exam regular rate and rhythm with no murmurs, rubs or bruits. Abdomen is soft. She has good bowel sounds. She has well-healed laparoscopy laparotomy scars. She has no fluid wave. There is no palpable liver or spleen tip. Back exam shows no tenderness over the spine, ribs or hips. Externally shows no clubbing, cyanosis or edema. She does have osteoarthritic changes in her joints. Skin  exam shows no suspicious rashes, ecchymoses or petechia. Neurological exam shows no focal neurological deficits.  Lab Results  Component Value Date   WBC 5.5 11/01/2015   HGB 11.6 11/01/2015   HCT 34.3* 11/01/2015   MCV 90 11/01/2015   PLT 162 11/01/2015     Chemistry      Component Value Date/Time   NA 135 11/01/2015 0932   NA 131* 06/24/2015 1922   K 3.4 11/01/2015 0932   K 4.1 06/24/2015 1922   CL 100 11/01/2015 0932   CL 95* 06/24/2015 1922   CO2 27 11/01/2015 0932   CO2 24 06/24/2015 1922   BUN 14 11/01/2015 0932   BUN 18 06/24/2015 1922   CREATININE 0.9 11/01/2015 0932   CREATININE 1.03* 06/24/2015 1922      Component Value Date/Time   CALCIUM  9.4 11/01/2015 0932   CALCIUM 10.1 06/24/2015 1922   ALKPHOS 33 11/01/2015 0932   ALKPHOS 56 04/29/2015 1517   AST 27 11/01/2015 0932   AST 70* 04/29/2015 1517   ALT 31 11/01/2015 0932   ALT 77* 04/29/2015 1517   BILITOT 0.80 11/01/2015 0932   BILITOT 0.6 04/29/2015 1517         Impression and Plan: Robin [name] is 75 year old white female. She is a past history of locally recurrent adenocarcinoma of the colon. She underwent resection back in 2000. She then received adjuvant FOLFOX.  I don't see any evidence of recurrent disease.  She is taking baby aspirin.  She is taking vitamin D. I think this is very helpful.  Her last mammogram was back in December 2016. Everything looked fine.   We will plan to get her back in 6 more months.   Robin Napoleon, MD 2/6/201710:29 AM

## 2015-11-02 LAB — VITAMIN D 25 HYDROXY (VIT D DEFICIENCY, FRACTURES): Vitamin D, 25-Hydroxy: 57.6 ng/mL (ref 30.0–100.0)

## 2015-11-02 LAB — LIPID PANEL
CHOL/HDL RATIO: 5.2 ratio — AB (ref 0.0–4.4)
CHOLESTEROL TOTAL: 208 mg/dL — AB (ref 100–199)
HDL: 40 mg/dL (ref >39)
LDL CALC: 131 mg/dL — AB (ref 0–99)
Triglycerides: 183 mg/dL — ABNORMAL HIGH (ref 0–149)
VLDL CHOLESTEROL CAL: 37 mg/dL (ref 5–40)

## 2015-11-02 LAB — CEA: CEA1: 2.8 ng/mL (ref 0.0–4.7)

## 2015-11-02 LAB — CEA (PARALLEL TESTING): CEA: 1.6 ng/mL

## 2015-11-18 ENCOUNTER — Other Ambulatory Visit: Payer: Self-pay | Admitting: *Deleted

## 2015-11-18 MED ORDER — METFORMIN HCL 1000 MG PO TABS: 1000.0000 mg | ORAL_TABLET | Freq: Two times a day (BID) | ORAL | Status: DC

## 2015-12-07 ENCOUNTER — Encounter: Payer: Self-pay | Admitting: Internal Medicine

## 2015-12-07 ENCOUNTER — Other Ambulatory Visit (INDEPENDENT_AMBULATORY_CARE_PROVIDER_SITE_OTHER): Payer: Medicare Other | Admitting: *Deleted

## 2015-12-07 ENCOUNTER — Ambulatory Visit (INDEPENDENT_AMBULATORY_CARE_PROVIDER_SITE_OTHER): Payer: Medicare Other | Admitting: Internal Medicine

## 2015-12-07 VITALS — BP 122/80 | HR 62 | Temp 98.6°F | Resp 12 | Wt 151.6 lb

## 2015-12-07 DIAGNOSIS — E785 Hyperlipidemia, unspecified: Secondary | ICD-10-CM

## 2015-12-07 DIAGNOSIS — E1121 Type 2 diabetes mellitus with diabetic nephropathy: Secondary | ICD-10-CM | POA: Insufficient documentation

## 2015-12-07 DIAGNOSIS — E782 Mixed hyperlipidemia: Secondary | ICD-10-CM | POA: Diagnosis not present

## 2015-12-07 DIAGNOSIS — E1169 Type 2 diabetes mellitus with other specified complication: Secondary | ICD-10-CM

## 2015-12-07 DIAGNOSIS — E119 Type 2 diabetes mellitus without complications: Secondary | ICD-10-CM

## 2015-12-07 LAB — POCT GLYCOSYLATED HEMOGLOBIN (HGB A1C): Hemoglobin A1C: 5.5

## 2015-12-07 MED ORDER — ONETOUCH VERIO VI STRP: ORAL_STRIP | Status: DC

## 2015-12-07 MED ORDER — ONETOUCH DELICA LANCETS 33G MISC: Status: DC

## 2015-12-07 NOTE — Progress Notes (Signed)
Patient ID: Robin [name], female   DOB: 17-Feb-1941, 75 y.o.   MRN: [id number]  HPI: Robin [name] is a 75 y.o.-year-old female, returning for f/u for DM2, initially GDM, then DM2 dx in 05/2015, insulin-independent, controlled, without complications. Last visit 3 mo ago.  Last hemoglobin A1c was tremendously improved when she started to improve her diet and started on metformin:  Lab Results  Component Value Date   HGBA1C 6.1 09/08/2015   HGBA1C 12.1* 06/24/2015   Pt is on a regimen of: - Metformin 1000 mg 2x a day, with meals  She saw a dietitian 07/09/2015 - this helped a lot.   Pt checks her sugars 2x a day: - am: 86-169 >> 83-122 >> 100-110 - 2h after b'fast: n/c - before lunch: n/c - 2h after lunch: 135 >> n/c - before dinner: 106-156, 202 >> 82-109, 128, 144 >> 80-100 - 2h after dinner: n/c - bedtime: 141 >> 114-136 >> 115, 116 - nighttime: n/c No lows. Lowest sugar was 86 >> 80s; she has hypoglycemia awareness at 70.  Highest sugar was 500s >> 120.  Glucometer: One Touch  Pt's meals are: - Breakfast: cheerios + raisin bran or cornflakes + 2% milk or bacon + egg + toast - Lunch: salad or soup or sandwich - Dinner: meat + veggies + starch - Snacks: no desserts; apple, chips  She walks every day.  - no CKD, last BUN/creatinine:  Lab Results  Component Value Date   BUN 14 11/01/2015   CREATININE 0.9 11/01/2015   - last set of lipids: Lab Results  Component Value Date   CHOL 208* 11/01/2015   HDL 40 11/01/2015   LDLCALC 131* 11/01/2015   TRIG 183* 11/01/2015   CHOLHDL 5.2* 11/01/2015   Intolerant to statins: bone pain.  - last eye exam was in 06/30/2015. No DR.  - no numbness and tingling in her feet.  She also has hypertension , hyperlipidemia-intolerant to statins (atorvastatin, rosuvastatin), OSA, depression, osteoarthritis, spinal stenosis, osteopenia, GERD, CKD stage III. + h/o colon cancer.  ROS: Constitutional: no weight gain/loss, no fatigue, no  subjective hyperthermia/hypothermia Eyes: no blurry vision, no xerophthalmia ENT: no sore throat, no nodules palpated in throat, no dysphagia/odynophagia, no hoarseness Cardiovascular: no CP/SOB/palpitations/leg swelling Respiratory: no cough/SOB Gastrointestinal: no N/V/D/C Musculoskeletal: no muscle/joint aches Skin: no rashes Neurological: no tremors/numbness/tingling/dizziness  I reviewed pt's medications, allergies, PMH, social hx, family hx, and changes were documented in the history of present illness. Otherwise, unchanged from my initial visit note.  Past Medical History  Diagnosis Date  . Colon cancer ([place])     colon ca dx 2008 and 2010  . Hypertension   . Arthritis   . Gestational diabetes   . Complication of anesthesia     problems waking up-patient thinks she was given too much medication  . Sleep apnea     uses CPAP  . Headache     headaches all the time   Past Surgical History  Procedure Laterality Date  . Back surgery  2009  . Rotator cuff repair    . Colon surgery    . Appendectomy    . Abdominal hysterectomy    . Carpal tunnel release    . Eye surgery      bilateral cataract surgery   . Dilation and curettage of uterus      had before had hysterectomy  . Colonoscopy with propofol N/A 08/10/2015    Procedure: COLONOSCOPY WITH PROPOFOL;  Surgeon: Garlan Fair,  MD;  Location: [facility];  Service: Endoscopy;  Laterality: N/A;  . Esophagogastroduodenoscopy (egd) with propofol N/A 08/10/2015    Procedure: ESOPHAGOGASTRODUODENOSCOPY (EGD) WITH PROPOFOL;  Surgeon: Garlan Fair, MD;  Location: [facility];  Service: Endoscopy;  Laterality: N/A;   Social History   Social History  . Marital Status: Divorced    Spouse Name: N/A  . Number of Children: 4  . Years of Education: 12+   Occupational History  . [employer]    Social History Main Topics  . Smoking status: Never Smoker   . Smokeless tobacco: Never Used     Comment: never used  tobacco  . Alcohol Use: No  . Drug Use: No  . Sexual Activity: Not on file   Other Topics Concern  . Not on file   Social History Narrative   Lives at home with herself.   Caffeine use: Drinks coffee and tea (1 cup coffee per day). Tea rarely    Current Outpatient Prescriptions on File Prior to Visit  Medication Sig Dispense Refill  . amLODipine (NORVASC) 5 MG tablet Take 5 mg by mouth Daily.    . beta carotene w/minerals (OCUVITE) tablet Take 1 tablet by mouth daily.    . Cholecalciferol (VITAMIN D-3 PO) Take 2,000 Units by mouth daily.    Marland Kitchen FLUoxetine (PROZAC) 20 MG capsule Take 20 mg by mouth Daily.    . fluticasone (FLONASE) 50 MCG/ACT nasal spray Place 1 spray into the nose 2 (two) times daily as needed for allergies or rhinitis.     Marland Kitchen GAVILYTE-N WITH FLAVOR PACK 420 g solution     . GLUCOSAMINE HCL PO Take 1 tablet by mouth daily.    Marland Kitchen levothyroxine (SYNTHROID, LEVOTHROID) 112 MCG tablet Take 112 mcg by mouth daily before breakfast.     . metFORMIN (GLUCOPHAGE) 1000 MG tablet Take 1 tablet (1,000 mg total) by mouth 2 (two) times daily with a meal. 120 tablet 1  . Multiple Vitamin (MULTIVITAMIN) capsule Take 1 capsule by mouth daily.    Glory Rosebush DELICA LANCETS 99991111 MISC     . ONETOUCH VERIO test strip     . polyvinyl alcohol (LIQUIFILM TEARS) 1.4 % ophthalmic solution Place 1 drop into both eyes as needed for dry eyes.    . Probiotic Product (PROBIOTIC DAILY) CAPS Take 1 capsule by mouth every morning.     . triamterene-hydrochlorothiazide (DYAZIDE) 37.5-25 MG per capsule Take 1 capsule by mouth Daily.      No current facility-administered medications on file prior to visit.   Allergies  Allergen Reactions  . Statins     Aches in her bones   Family History  Problem Relation Age of Onset  . Migraines Neg Hx    PE: BP 122/80 mmHg  Pulse 62  Temp(Src) 98.6 F (37 C) (Oral)  Resp 12  Wt 151 lb 9.6 oz (68.765 kg)  SpO2 91% Body mass index is 27.72 kg/(m^2). Wt  Readings from Last 3 Encounters:  12/07/15 151 lb 9.6 oz (68.765 kg)  11/01/15 154 lb (69.854 kg)  09/08/15 154 lb 6.4 oz (70.035 kg)   Constitutional: overweight, in NAD Eyes: PERRLA, EOMI, no exophthalmos ENT: moist mucous membranes, no thyromegaly, no cervical lymphadenopathy Cardiovascular: RRR, No MRG Respiratory: CTA B Gastrointestinal: abdomen soft, NT, ND, BS+ Musculoskeletal: + deformities (Heber[place], Bouchard nodules), strength intact in all 4 Skin: moist, warm, no rashes Neurological: no tremor with outstretched hands, DTR normal in all 4  ASSESSMENT: 1. DM2, non-insulin-dependent,  controlled, without complications  2. HL  PLAN:  1. Patient with recently dx'ed, now controlled, diabetes, on oral antidiabetic regimen (metformin) with great improvement in sugars after starting this. Today her sugars are all at goal >> HbA1c 5.5%! (even lower). Will continue Metformin. - I suggested to:  Patient Instructions  Please continue Metformin 1000 mg 2x a day with meals. Please return in 3 months with your sugar log.   - continue checking sugars at different times of the day - check 2 times a day, rotating checks - UTD with flu shot - advised for yearly eye exams >> she is UTD - Return to clinic in 4 mo with sugar log   2. HL - she is intolerant to statins - we reviewed together her Lipid levels from 10/2014 - I suggested that she tries Benecol

## 2015-12-07 NOTE — Patient Instructions (Signed)
Patient Instructions  Please continue Metformin 1000 mg 2x a day with meals.  Please look up Benecol.  Please return in 4 months with your sugar log.   KEEP UP THE GREAT WORK!!!!

## 2016-04-07 ENCOUNTER — Encounter: Payer: Self-pay | Admitting: Internal Medicine

## 2016-04-07 ENCOUNTER — Ambulatory Visit (INDEPENDENT_AMBULATORY_CARE_PROVIDER_SITE_OTHER): Payer: Medicare Other | Admitting: Internal Medicine

## 2016-04-07 VITALS — BP 146/70 | HR 66 | Temp 98.5°F | Ht 62.0 in | Wt 157.2 lb

## 2016-04-07 DIAGNOSIS — E119 Type 2 diabetes mellitus without complications: Secondary | ICD-10-CM | POA: Diagnosis not present

## 2016-04-07 DIAGNOSIS — E782 Mixed hyperlipidemia: Secondary | ICD-10-CM

## 2016-04-07 LAB — POCT GLYCOSYLATED HEMOGLOBIN (HGB A1C): Hemoglobin A1C: 5.8

## 2016-04-07 MED ORDER — METFORMIN HCL 1000 MG PO TABS
1000.0000 mg | ORAL_TABLET | Freq: Every day | ORAL | Status: DC
Start: 2016-04-07 — End: 2017-04-04

## 2016-04-07 NOTE — Progress Notes (Signed)
Patient ID: Robin [name], female   DOB: 28-May-1941, 75 y.o.   MRN: [id number]  HPI: Robin [name] is a 75 y.o.-year-old female, returning for f/u for DM2, initially GDM, then DM2 dx in 05/2015, insulin-independent, controlled, without complications. Last visit 3 mo ago.  Last hemoglobin A1c was tremendously improved when she started to improve her diet and started on metformin:  Lab Results  Component Value Date   HGBA1C 5.5 12/07/2015   HGBA1C 6.1 09/08/2015   HGBA1C 12.1* 06/24/2015   Pt is on a regimen of: - Metformin 1000 mg 2x a day, with meals  She saw a dietitian 07/09/2015 - this helped a lot.   Pt checks her sugars 1x a day: - am: 86-169 >> 83-122 >> 100-110 >> 88-118 - 2h after b'fast: n/c - before lunch: n/c >> 81-89 - 2h after lunch: 135 >> n/c - before dinner: 106-156, 202 >> 82-109, 128, 144 >> 80-100 >> 78, 89-129 - 2h after dinner: n/c >> 83 - bedtime: 141 >> 114-136 >> 115, 116 >>  - nighttime: n/c No lows. Lowest sugar was 86 >> 80s >> 78; she has hypoglycemia awareness at 70.  Highest sugar was 500s >> 120 >> 129.  Glucometer: One Touch  Pt's meals are: - Breakfast: cheerios + raisin bran or cornflakes + 2% milk or bacon + egg + toast - Lunch: salad or soup or sandwich - Dinner: meat + veggies + starch - Snacks: no desserts; apple, chips  She walks every day for exercise.  - no CKD, last BUN/creatinine:  Lab Results  Component Value Date   BUN 14 11/01/2015   CREATININE 0.9 11/01/2015   - last set of lipids: Lab Results  Component Value Date   CHOL 208* 11/01/2015   HDL 40 11/01/2015   LDLCALC 131* 11/01/2015   TRIG 183* 11/01/2015   CHOLHDL 5.2* 11/01/2015  Intolerant to statins: bone pain.I recommended Benecol at last visit >> did not start.  - last eye exam was in 06/30/2015. No DR.  - no numbness and tingling in her feet.  She also has hypertension , hyperlipidemia-intolerant to statins (atorvastatin, rosuvastatin), OSA, depression,  osteoarthritis, spinal stenosis, osteopenia, GERD, CKD stage III. + h/o colon cancer.  ROS: Constitutional: no weight gain/loss, no fatigue, no subjective hyperthermia/hypothermia Eyes: no blurry vision, no xerophthalmia ENT: no sore throat, no nodules palpated in throat, no dysphagia/odynophagia, no hoarseness Cardiovascular: no CP/SOB/palpitations/leg swelling Respiratory: no cough/SOB Gastrointestinal: no N/V/D/C Musculoskeletal: no muscle/joint aches Skin: no rashes Neurological: no tremors/numbness/tingling/dizziness  I reviewed pt's medications, allergies, PMH, social hx, family hx, and changes were documented in the history of present illness. Otherwise, unchanged from my initial visit note. Started Turmeric.  Past Medical History  Diagnosis Date  . Colon cancer ([place])     colon ca dx 2008 and 2010  . Hypertension   . Arthritis   . Gestational diabetes   . Complication of anesthesia     problems waking up-patient thinks she was given too much medication  . Sleep apnea     uses CPAP  . Headache     headaches all the time   Past Surgical History  Procedure Laterality Date  . Back surgery  2009  . Rotator cuff repair    . Colon surgery    . Appendectomy    . Abdominal hysterectomy    . Carpal tunnel release    . Eye surgery      bilateral cataract surgery   . Dilation  and curettage of uterus      had before had hysterectomy  . Colonoscopy with propofol N/A 08/10/2015    Procedure: COLONOSCOPY WITH PROPOFOL;  Surgeon: Garlan Fair, MD;  Location: [facility];  Service: Endoscopy;  Laterality: N/A;  . Esophagogastroduodenoscopy (egd) with propofol N/A 08/10/2015    Procedure: ESOPHAGOGASTRODUODENOSCOPY (EGD) WITH PROPOFOL;  Surgeon: Garlan Fair, MD;  Location: [facility];  Service: Endoscopy;  Laterality: N/A;   Social History   Social History  . Marital Status: Divorced    Spouse Name: N/A  . Number of Children: 4  . Years of Education: 12+    Occupational History  . [employer]    Social History Main Topics  . Smoking status: Never Smoker   . Smokeless tobacco: Never Used     Comment: never used tobacco  . Alcohol Use: No  . Drug Use: No  . Sexual Activity: Not on file   Other Topics Concern  . Not on file   Social History Narrative   Lives at home with herself.   Caffeine use: Drinks coffee and tea (1 cup coffee per day). Tea rarely    Current Outpatient Prescriptions on File Prior to Visit  Medication Sig Dispense Refill  . amLODipine (NORVASC) 5 MG tablet Take 5 mg by mouth Daily.    . beta carotene w/minerals (OCUVITE) tablet Take 1 tablet by mouth daily.    . Cholecalciferol (VITAMIN D-3 PO) Take 2,000 Units by mouth daily.    Marland Kitchen FLUoxetine (PROZAC) 20 MG capsule Take 20 mg by mouth Daily.    . fluticasone (FLONASE) 50 MCG/ACT nasal spray Place 1 spray into the nose 2 (two) times daily as needed for allergies or rhinitis.     Marland Kitchen GLUCOSAMINE HCL PO Take 1 tablet by mouth daily.    Marland Kitchen levothyroxine (SYNTHROID, LEVOTHROID) 112 MCG tablet Take 112 mcg by mouth daily before breakfast.     . metFORMIN (GLUCOPHAGE) 1000 MG tablet Take 1 tablet (1,000 mg total) by mouth 2 (two) times daily with a meal. 120 tablet 1  . Multiple Vitamin (MULTIVITAMIN) capsule Take 1 capsule by mouth daily.    Glory Rosebush DELICA LANCETS 99991111 MISC Use 1x a day 100 each 11  . ONETOUCH VERIO test strip Check 1x a day 100 each 11  . polyvinyl alcohol (LIQUIFILM TEARS) 1.4 % ophthalmic solution Place 1 drop into both eyes as needed for dry eyes.    . Probiotic Product (PROBIOTIC DAILY) CAPS Take 1 capsule by mouth every morning.     . triamterene-hydrochlorothiazide (DYAZIDE) 37.5-25 MG per capsule Take 1 capsule by mouth Daily.      No current facility-administered medications on file prior to visit.   Allergies  Allergen Reactions  . Statins     Aches in her bones   Family History  Problem Relation Age of Onset  . Migraines Neg Hx     PE: BP 146/70 mmHg  Pulse 66  Temp(Src) 98.5 F (36.9 C) (Oral)  Ht 5\' 2"  (1.575 m)  Wt 157 lb 3.2 oz (71.305 kg)  BMI 28.74 kg/m2  SpO2 97% Body mass index is 28.74 kg/(m^2). Wt Readings from Last 3 Encounters:  04/07/16 157 lb 3.2 oz (71.305 kg)  12/07/15 151 lb 9.6 oz (68.765 kg)  11/01/15 154 lb (69.854 kg)   Constitutional: overweight, in NAD Eyes: PERRLA, EOMI, no exophthalmos ENT: moist mucous membranes, no thyromegaly, no cervical lymphadenopathy Cardiovascular: RRR, No MRG Respiratory: CTA B Gastrointestinal: abdomen soft,  NT, ND, BS+ Musculoskeletal: + deformities ([place], Bouchard nodules), strength intact in all 4 Skin: moist, warm, no rashes Neurological: no tremor with outstretched hands, DTR normal in all 4  ASSESSMENT: 1. DM2, non-insulin-dependent, controlled, without complications  2. HL  PLAN:  1. Patient with relatively recently dx'ed, now controlled, diabetes, on oral antidiabetic regimen (metformin) with great improvement in sugars after starting this. Last HbA1c was excellent, 5.5%! Will continue Metformin, but will decrease the dose as sugars are excellent. - I suggested to:  Patient Instructions  Please decrease Metformin to 1000 mg with dinner only.  Try Benecol spred.  Please return in 3 months with your sugar log.    - continue checking sugars at different times of the day - check once a day, rotating checks - advised for yearly eye exams >> she is UTD - checked HbA1c today: 5.8% (excellent) - Return to clinic in 3 mo with sugar log   2. HL - she is intolerant to statins - we reviewed together her Lipid levels from 10/2014 - at last visit I suggested that she tried Benecol >> she did not try yet >> will start

## 2016-04-07 NOTE — Patient Instructions (Signed)
Please decrease Metformin to 1000 mg with dinner only.  Try Benecol spred.  Please return in 3 months with your sugar log.

## 2016-04-07 NOTE — Progress Notes (Signed)
Pre visit review using our clinic review tool, if applicable. No additional management support is needed unless otherwise documented below in the visit note. 

## 2016-05-01 ENCOUNTER — Other Ambulatory Visit: Payer: Medicare Other

## 2016-05-01 ENCOUNTER — Ambulatory Visit: Payer: Medicare Other | Admitting: Hematology & Oncology

## 2016-05-18 ENCOUNTER — Ambulatory Visit (HOSPITAL_BASED_OUTPATIENT_CLINIC_OR_DEPARTMENT_OTHER): Payer: Medicare Other | Admitting: Hematology & Oncology

## 2016-05-18 ENCOUNTER — Other Ambulatory Visit (HOSPITAL_BASED_OUTPATIENT_CLINIC_OR_DEPARTMENT_OTHER): Payer: Medicare Other

## 2016-05-18 VITALS — BP 147/63 | HR 62 | Temp 98.1°F | Resp 18 | Wt 153.0 lb

## 2016-05-18 DIAGNOSIS — Z85038 Personal history of other malignant neoplasm of large intestine: Secondary | ICD-10-CM

## 2016-05-18 DIAGNOSIS — R1012 Left upper quadrant pain: Secondary | ICD-10-CM

## 2016-05-18 DIAGNOSIS — E785 Hyperlipidemia, unspecified: Principal | ICD-10-CM

## 2016-05-18 DIAGNOSIS — C185 Malignant neoplasm of splenic flexure: Secondary | ICD-10-CM

## 2016-05-18 DIAGNOSIS — E1169 Type 2 diabetes mellitus with other specified complication: Secondary | ICD-10-CM

## 2016-05-18 DIAGNOSIS — C184 Malignant neoplasm of transverse colon: Secondary | ICD-10-CM

## 2016-05-18 LAB — COMPREHENSIVE METABOLIC PANEL (CC13)
ALT: 26 IU/L (ref 0–32)
AST: 19 IU/L (ref 0–40)
Albumin, Serum: 4.5 g/dL (ref 3.5–4.8)
Albumin/Globulin Ratio: 1.9 (ref 1.2–2.2)
Alkaline Phosphatase, S: 51 IU/L (ref 39–117)
BUN/Creatinine Ratio: 20 (ref 12–28)
BUN: 17 mg/dL (ref 8–27)
Bilirubin Total: 0.3 mg/dL (ref 0.0–1.2)
CALCIUM: 10 mg/dL (ref 8.7–10.3)
CO2: 26 mmol/L (ref 18–29)
CREATININE: 0.83 mg/dL (ref 0.57–1.00)
Chloride, Ser: 102 mmol/L (ref 96–106)
GFR calc Af Amer: 80 mL/min/{1.73_m2} (ref >59)
GFR, EST NON AFRICAN AMERICAN: 69 mL/min/{1.73_m2} (ref >59)
GLOBULIN, TOTAL: 2.4 g/dL (ref 1.5–4.5)
Glucose: 100 mg/dL — ABNORMAL HIGH (ref 65–99)
Potassium, Ser: 3.7 mmol/L (ref 3.5–5.2)
SODIUM: 138 mmol/L (ref 134–144)
TOTAL PROTEIN: 6.9 g/dL (ref 6.0–8.5)

## 2016-05-18 LAB — CBC WITH DIFFERENTIAL (CANCER CENTER ONLY)
BASO#: 0 10*3/uL (ref 0.0–0.2)
BASO%: 0.3 % (ref 0.0–2.0)
EOS%: 5.6 % (ref 0.0–7.0)
Eosinophils Absolute: 0.4 10*3/uL (ref 0.0–0.5)
HCT: 35.9 % (ref 34.8–46.6)
HEMOGLOBIN: 12.7 g/dL (ref 11.6–15.9)
LYMPH#: 2.5 10*3/uL (ref 0.9–3.3)
LYMPH%: 38 % (ref 14.0–48.0)
MCH: 31.1 pg (ref 26.0–34.0)
MCHC: 35.4 g/dL (ref 32.0–36.0)
MCV: 88 fL (ref 81–101)
MONO#: 0.6 10*3/uL (ref 0.1–0.9)
MONO%: 9.8 % (ref 0.0–13.0)
NEUT%: 46.3 % (ref 39.6–80.0)
NEUTROS ABS: 3 10*3/uL (ref 1.5–6.5)
Platelets: 199 10*3/uL (ref 145–400)
RBC: 4.08 10*6/uL (ref 3.70–5.32)
RDW: 12.2 % (ref 11.1–15.7)
WBC: 6.5 10*3/uL (ref 3.9–10.0)

## 2016-05-18 NOTE — Progress Notes (Signed)
Hematology and Oncology Follow Up Visit  Robin [name]:[id number] 1940-10-07 75 y.o. 05/18/2016   Principle Diagnosis:  History of locally recurrent adenocarcinoma of the colon  Current Therapy:    Observation     Interim History:  [name] is back for follow-up. She had a wonderful time up in [place] recently. She was at the [employer] exhibit with her church group. She had a really wonderful time.  She has been complaining of some abdominal pain in the left upper quadrant. This has been going on for several weeks. It is not affected by eating. It is not affected by going to the bathroom. She's had no rash. She's had no fever. She's had no dysuria.   There's been no cough or shortness of breath. She's had no nausea or vomiting.   Her last CEA back in February was 2.8 which is holding steady.   Overall, her performance status is ECOG 1.  Medications:  Current Outpatient Prescriptions:  Marland Kitchen  GARLIC OIL PO, Take by mouth., Disp: , Rfl:  .  TURMERIC PO, Take by mouth., Disp: , Rfl:  .  amLODipine (NORVASC) 5 MG tablet, Take 5 mg by mouth Daily., Disp: , Rfl:  .  beta carotene w/minerals (OCUVITE) tablet, Take 1 tablet by mouth daily., Disp: , Rfl:  .  Cholecalciferol (VITAMIN D-3 PO), Take 2,000 Units by mouth daily., Disp: , Rfl:  .  FLUoxetine (PROZAC) 20 MG capsule, Take 20 mg by mouth Daily., Disp: , Rfl:  .  fluticasone (FLONASE) 50 MCG/ACT nasal spray, Place 1 spray into the nose 2 (two) times daily as needed for allergies or rhinitis. , Disp: , Rfl:  .  GLUCOSAMINE HCL PO, Take 1 tablet by mouth daily., Disp: , Rfl:  .  levothyroxine (SYNTHROID, LEVOTHROID) 112 MCG tablet, Take 112 mcg by mouth daily before breakfast. , Disp: , Rfl:  .  metFORMIN (GLUCOPHAGE) 1000 MG tablet, Take 1 tablet (1,000 mg total) by mouth daily with supper., Disp: 90 tablet, Rfl: 3 .  Multiple Vitamin (MULTIVITAMIN) capsule, Take 1 capsule by mouth daily., Disp: , Rfl:  .  ONETOUCH DELICA LANCETS  99991111 MISC, Use 1x a day, Disp: 100 each, Rfl: 11 .  ONETOUCH VERIO test strip, Check 1x a day, Disp: 100 each, Rfl: 11 .  polyvinyl alcohol (LIQUIFILM TEARS) 1.4 % ophthalmic solution, Place 1 drop into both eyes as needed for dry eyes., Disp: , Rfl:  .  Probiotic Product (PROBIOTIC DAILY) CAPS, Take 1 capsule by mouth every morning. , Disp: , Rfl:  .  triamterene-hydrochlorothiazide (DYAZIDE) 37.5-25 MG per capsule, Take 1 capsule by mouth Daily. , Disp: , Rfl:   Allergies:  Allergies  Allergen Reactions  . Statins     Aches in her bones    Past Medical History, Surgical history, Social history, and Family History were reviewed and updated.  Review of Systems: As above  Physical Exam:  weight is 153 lb (69.4 kg). Her oral temperature is 98.1 F (36.7 C). Her blood pressure is 147/63 (abnormal) and her pulse is 62. Her respiration is 18.   Well-developed and well-nourished white female in no obvious distress. Head and neck exam shows no ocular or oral lesions. She has no adenopathy in the neck. Lungs are clear. Cardiac exam regular rate and rhythm with no murmurs, rubs or bruits. Abdomen is soft. She has some slight tenderness in the left upper quadrant. No obvious abdominal masses noted. There is no guarding or rebound  tenderness. She has good bowel sounds. She has well-healed laparoscopy scars. She has no fluid wave. There is no palpable liver or spleen tip. Back exam shows no tenderness over the spine, ribs or hips. Externally shows no clubbing, cyanosis or edema. She does have osteoarthritic changes in her joints. Skin exam shows no suspicious rashes, ecchymoses or petechia. Neurological exam shows no focal neurological deficits.  Lab Results  Component Value Date   WBC 6.5 05/18/2016   HGB 12.7 05/18/2016   HCT 35.9 05/18/2016   MCV 88 05/18/2016   PLT 199 05/18/2016     Chemistry      Component Value Date/Time   NA 135 11/01/2015 0932   K 3.4 11/01/2015 0932   CL 100  11/01/2015 0932   CO2 27 11/01/2015 0932   BUN 14 11/01/2015 0932   CREATININE 0.9 11/01/2015 0932      Component Value Date/Time   CALCIUM 9.4 11/01/2015 0932   ALKPHOS 33 11/01/2015 0932   AST 27 11/01/2015 0932   ALT 31 11/01/2015 0932   BILITOT 0.80 11/01/2015 0932         Impression and Plan: [name] is 75 year old white female. She is a past history of locally recurrent adenocarcinoma of the colon. She underwent resection back in 2000. She then received adjuvant FOLFOX.  I an not sure what is going on with this abdominal pain. Given her past history, I really think that we should get a CT of the abdomen and pelvis. I want to make sure she does not have recurrence. She is at risk for this. I do think this would be unusual but yet I think we have to follow-up with this.   We will try to get the CT scan next week.  If all looks good, then we will get her back in 6 months.   [name], MD 8/24/20174:57 PM

## 2016-05-19 LAB — CEA: CEA: 3 ng/mL (ref 0.0–4.7)

## 2016-05-19 LAB — LACTATE DEHYDROGENASE: LDH: 154 U/L (ref 125–245)

## 2016-05-19 LAB — CEA (IN HOUSE-CHCC): CEA (CHCC-In House): 1.98 ng/mL (ref 0.00–5.00)

## 2016-05-30 ENCOUNTER — Telehealth: Payer: Self-pay | Admitting: *Deleted

## 2016-05-30 NOTE — Telephone Encounter (Signed)
"  How much contrast do I need for my scans.  I came by [facility] and Radiology department today."  Called [facility] CT on patient's behalf.  Needs two bottles of contrast.  Drink one bottle four hours and the second one hour before.  Instructed patient to write these instructions down and ask for two bottles when she returns.

## 2016-06-01 ENCOUNTER — Ambulatory Visit (HOSPITAL_COMMUNITY)
Admission: RE | Admit: 2016-06-01 | Discharge: 2016-06-01 | Disposition: A | Discharge status: Home / Self Care | Payer: Medicare Other | Source: Ambulatory Visit | Attending: Hematology & Oncology | Admitting: Hematology & Oncology

## 2016-06-01 DIAGNOSIS — K760 Fatty (change of) liver, not elsewhere classified: Secondary | ICD-10-CM | POA: Insufficient documentation

## 2016-06-01 DIAGNOSIS — I700 Atherosclerosis of aorta: Secondary | ICD-10-CM | POA: Insufficient documentation

## 2016-06-01 DIAGNOSIS — Z85038 Personal history of other malignant neoplasm of large intestine: Secondary | ICD-10-CM | POA: Insufficient documentation

## 2016-06-01 DIAGNOSIS — C185 Malignant neoplasm of splenic flexure: Secondary | ICD-10-CM

## 2016-06-01 DIAGNOSIS — Z9049 Acquired absence of other specified parts of digestive tract: Secondary | ICD-10-CM | POA: Diagnosis present

## 2016-06-01 DIAGNOSIS — R1012 Left upper quadrant pain: Secondary | ICD-10-CM | POA: Diagnosis not present

## 2016-06-01 MED ORDER — IOPAMIDOL (ISOVUE-300) INJECTION 61%
100.0000 mL | Freq: Once | INTRAVENOUS | Status: AC | PRN
  Administered 2016-06-01: 100 mL via INTRAVENOUS

## 2016-06-02 ENCOUNTER — Telehealth: Payer: Self-pay

## 2016-06-02 NOTE — Telephone Encounter (Signed)
-----   Message from [name], MD sent at 06/01/2016  5:28 PM EDT ----- Call - the CT scan dose NOT show any obvious cancer!!  Robin [name]

## 2016-07-14 ENCOUNTER — Encounter: Payer: Self-pay | Admitting: Internal Medicine

## 2016-07-14 ENCOUNTER — Ambulatory Visit (INDEPENDENT_AMBULATORY_CARE_PROVIDER_SITE_OTHER): Payer: Medicare Other | Admitting: Internal Medicine

## 2016-07-14 VITALS — BP 138/80 | HR 60 | Ht 62.0 in | Wt 157.0 lb

## 2016-07-14 DIAGNOSIS — E785 Hyperlipidemia, unspecified: Secondary | ICD-10-CM | POA: Diagnosis not present

## 2016-07-14 DIAGNOSIS — E1169 Type 2 diabetes mellitus with other specified complication: Secondary | ICD-10-CM | POA: Diagnosis not present

## 2016-07-14 DIAGNOSIS — E119 Type 2 diabetes mellitus without complications: Secondary | ICD-10-CM

## 2016-07-14 LAB — POCT GLYCOSYLATED HEMOGLOBIN (HGB A1C): HEMOGLOBIN A1C: 6

## 2016-07-14 NOTE — Progress Notes (Signed)
Patient ID: Robin [name], female   DOB: 1941-02-01, 75 y.o.   MRN: [id number]  HPI: Robin [name] is a 75 y.o.-year-old female, returning for f/u for DM2, initially GDM, then DM2 dx in 05/2015, insulin-independent, controlled, without complications. Last visit 3 mo ago.  Last hemoglobin A1c was tremendously improved when she started to improve her diet and started on metformin:  Lab Results  Component Value Date   HGBA1C 5.8 04/07/2016   HGBA1C 5.5 12/07/2015   HGBA1C 6.1 09/08/2015   Pt is on a regimen of: - Metformin 1000 mg with dinner (decreased 03/2016)  She saw a dietitian 07/09/2015 - this helped a lot.   Pt checks her sugars 1x a day:  - am: 86-169 >> 83-122 >> 100-110 >> 88-118 >> 130, 131 - 2h after b'fast: n/c - before lunch: n/c >> 81-89 >> n/c - 2h after lunch: 135 >> n/c >> 83-105, 154 - before dinner: 106-156, 202 >> 82-109, 128, 144 >> 80-100 >> 78, 89-129 >> 82-114 - 2h after dinner: n/c >> 83 - bedtime: 141 >> 114-136 >> 115, 116 >> 94 - nighttime: n/c No lows. Lowest sugar was 86 >> 80s >> 78 >> 82; she has hypoglycemia awareness at 70.  Highest sugar was 500s >> 120 >> 129 >> 154.  Glucometer: One Touch  Pt's meals are: - Breakfast: cheerios + raisin bran or cornflakes + 2% milk or bacon + egg + toast - Lunch: salad or soup or sandwich - Dinner: meat + veggies + starch - Snacks: no desserts; apple, chips  She walks every day for exercise.  - no CKD, last BUN/creatinine:  Lab Results  Component Value Date   BUN 17 05/18/2016   CREATININE 0.83 05/18/2016   - last set of lipids: Lab Results  Component Value Date   CHOL 208 (H) 11/01/2015   HDL 40 11/01/2015   LDLCALC 131 (H) 11/01/2015   TRIG 183 (H) 11/01/2015   CHOLHDL 5.2 (H) 11/01/2015  Intolerant to statins: bone pain.I recommended Benecol at last visit >> did not start.  - last eye exam was in 06/30/2015. No DR.  - no numbness and tingling in her feet.  She also has hypertension ,  hyperlipidemia-intolerant to statins (atorvastatin, rosuvastatin), OSA, depression, osteoarthritis, spinal stenosis, osteopenia, GERD, CKD stage III. + h/o colon cancer.  ROS: Constitutional: no weight gain/loss, no fatigue, no subjective hyperthermia/hypothermia Eyes: no blurry vision, no xerophthalmia ENT: no sore throat, no nodules palpated in throat, no dysphagia/odynophagia, no hoarseness Cardiovascular: no CP/SOB/palpitations/leg swelling Respiratory: no cough/SOB Gastrointestinal: no N/V/D/C Musculoskeletal: no muscle/joint aches Skin: no rashes Neurological: no tremors/numbness/tingling/dizziness  I reviewed pt's medications, allergies, PMH, social hx, family hx, and changes were documented in the history of present illness. Otherwise, unchanged from my initial visit note.   Past Medical History:  Diagnosis Date  . Arthritis   . Colon cancer ([place])    colon ca dx 2008 and 2010  . Complication of anesthesia    problems waking up-patient thinks she was given too much medication  . Gestational diabetes   . Headache    headaches all the time  . Hypertension   . Sleep apnea    uses CPAP   Past Surgical History:  Procedure Laterality Date  . ABDOMINAL HYSTERECTOMY    . APPENDECTOMY    . BACK SURGERY  2009  . CARPAL TUNNEL RELEASE    . COLON SURGERY    . COLONOSCOPY WITH PROPOFOL N/A 08/10/2015   Procedure:  COLONOSCOPY WITH PROPOFOL;  Surgeon: Garlan Fair, MD;  Location: [facility];  Service: Endoscopy;  Laterality: N/A;  . DILATION AND CURETTAGE OF UTERUS     had before had hysterectomy  . ESOPHAGOGASTRODUODENOSCOPY (EGD) WITH PROPOFOL N/A 08/10/2015   Procedure: ESOPHAGOGASTRODUODENOSCOPY (EGD) WITH PROPOFOL;  Surgeon: Garlan Fair, MD;  Location: [facility];  Service: Endoscopy;  Laterality: N/A;  . EYE SURGERY     bilateral cataract surgery   . ROTATOR CUFF REPAIR     Social History   Social History  . Marital status: Divorced    Spouse name: N/A  .  Number of children: 4  . Years of education: 12+   Occupational History  . [employer]    Social History Main Topics  . Smoking status: Never Smoker  . Smokeless tobacco: Never Used     Comment: never used tobacco  . Alcohol use No  . Drug use: No  . Sexual activity: Not on file   Other Topics Concern  . Not on file   Social History Narrative   Lives at home with herself.   Caffeine use: Drinks coffee and tea (1 cup coffee per day). Tea rarely    Current Outpatient Prescriptions on File Prior to Visit  Medication Sig Dispense Refill  . amLODipine (NORVASC) 5 MG tablet Take 5 mg by mouth Daily.    . beta carotene w/minerals (OCUVITE) tablet Take 1 tablet by mouth daily.    . Cholecalciferol (VITAMIN D-3 PO) Take 2,000 Units by mouth daily.    Marland Kitchen FLUoxetine (PROZAC) 20 MG capsule Take 20 mg by mouth Daily.    . fluticasone (FLONASE) 50 MCG/ACT nasal spray Place 1 spray into the nose 2 (two) times daily as needed for allergies or rhinitis.     Marland Kitchen GARLIC OIL PO Take by mouth.    Marland Kitchen GLUCOSAMINE HCL PO Take 1 tablet by mouth daily.    Marland Kitchen levothyroxine (SYNTHROID, LEVOTHROID) 112 MCG tablet Take 112 mcg by mouth daily before breakfast.     . metFORMIN (GLUCOPHAGE) 1000 MG tablet Take 1 tablet (1,000 mg total) by mouth daily with supper. 90 tablet 3  . Multiple Vitamin (MULTIVITAMIN) capsule Take 1 capsule by mouth daily.    Glory Rosebush DELICA LANCETS 99991111 MISC Use 1x a day 100 each 11  . ONETOUCH VERIO test strip Check 1x a day 100 each 11  . polyvinyl alcohol (LIQUIFILM TEARS) 1.4 % ophthalmic solution Place 1 drop into both eyes as needed for dry eyes.    . Probiotic Product (PROBIOTIC DAILY) CAPS Take 1 capsule by mouth every morning.     . triamterene-hydrochlorothiazide (DYAZIDE) 37.5-25 MG per capsule Take 1 capsule by mouth Daily.     . TURMERIC PO Take by mouth.     No current facility-administered medications on file prior to visit.    Allergies  Allergen Reactions  .  Statins     Aches in her bones   Family History  Problem Relation Age of Onset  . Migraines Neg Hx    PE: BP 138/80 (BP Location: Left Arm, Patient Position: Sitting)   Pulse 60   Ht 5\' 2"  (1.575 m)   Wt 157 lb (71.2 kg)   SpO2 97%   BMI 28.72 kg/m  Body mass index is 28.72 kg/m. Wt Readings from Last 3 Encounters:  07/14/16 157 lb (71.2 kg)  05/18/16 153 lb (69.4 kg)  04/07/16 157 lb 3.2 oz (71.3 kg)   Constitutional: overweight, in  NAD Eyes: PERRLA, EOMI, no exophthalmos ENT: moist mucous membranes, no thyromegaly, no cervical lymphadenopathy Cardiovascular: RRR, No MRG Respiratory: CTA B Gastrointestinal: abdomen soft, NT, ND, BS+ Musculoskeletal: + deformities ([place], Bouchard nodules), strength intact in all 4 Skin: moist, warm, no rashes Neurological: no tremor with outstretched hands, DTR normal in all 4  ASSESSMENT: 1. DM2, non-insulin-dependent, controlled, without complications  2. HL  PLAN:  1. Patient with relatively recently dx'ed, now controlled, diabetes, on oral antidiabetic regimen (metformin) with great improvement in sugars after starting this. Last HbA1c was excellent, 5.8% >> we decreased the dose as sugars were excellent. Will continue this dose. - I suggested to:  Patient Instructions  Please continue Metformin 1000 mg with dinner only.  Try Benecol spread.  Please return in 4 months with your sugar log.   - continue checking sugars at different times of the day - check once a day, rotating checks - advised for yearly eye exams >> she needs a new one - she got the flu shot this season - checked HbA1c today: 6.0% (excellent) - Return to clinic in 4 mo with sugar log   2. HL - she is intolerant to statins - I reviewed together her Lipid levels from 10/2014 - at last visit I suggested that she tried Benecol >> she did not try yet >> will start  Philemon Kingdom, MD PhD [facility] Endocrinology

## 2016-07-14 NOTE — Patient Instructions (Addendum)
Please continue Metformin 1000 mg with dinner only.  Try Benecol spread.  Please return in 4 months with your sugar log.

## 2016-07-25 ENCOUNTER — Other Ambulatory Visit: Payer: Self-pay | Admitting: Family Medicine

## 2016-07-25 DIAGNOSIS — Z1231 Encounter for screening mammogram for malignant neoplasm of breast: Secondary | ICD-10-CM

## 2016-08-24 LAB — HM DIABETES EYE EXAM

## 2016-08-29 ENCOUNTER — Ambulatory Visit
Admission: RE | Admit: 2016-08-29 | Discharge: 2016-08-29 | Disposition: A | Discharge status: Home / Self Care | Payer: Medicare Other | Source: Ambulatory Visit | Attending: Family Medicine | Admitting: Family Medicine

## 2016-08-29 DIAGNOSIS — Z1231 Encounter for screening mammogram for malignant neoplasm of breast: Secondary | ICD-10-CM

## 2016-11-14 ENCOUNTER — Ambulatory Visit (INDEPENDENT_AMBULATORY_CARE_PROVIDER_SITE_OTHER): Payer: Medicare Other | Admitting: Internal Medicine

## 2016-11-14 ENCOUNTER — Encounter: Payer: Self-pay | Admitting: Internal Medicine

## 2016-11-14 VITALS — BP 140/78 | HR 65 | Ht 62.0 in | Wt 156.0 lb

## 2016-11-14 DIAGNOSIS — E119 Type 2 diabetes mellitus without complications: Secondary | ICD-10-CM | POA: Diagnosis not present

## 2016-11-14 LAB — POCT GLYCOSYLATED HEMOGLOBIN (HGB A1C): HEMOGLOBIN A1C: 6.1

## 2016-11-14 NOTE — Patient Instructions (Addendum)
Please continue Metformin 1000 mg with dinner.  Please return in 4 months with your sugar log.

## 2016-11-14 NOTE — Progress Notes (Signed)
Patient ID: Robin [name], female   DOB: 1941/06/28, 76 y.o.   MRN: [id number]  HPI: Robin [name] is a 76 y.o.-year-old female, returning for f/u for DM2, initially GDM, then DM2 dx in 05/2015, insulin-independent, controlled, without complications. Last visit 4 mo ago.  Last hemoglobin A1c: Lab Results  Component Value Date   HGBA1C 6.0 07/14/2016   HGBA1C 5.8 04/07/2016   HGBA1C 5.5 12/07/2015   Pt is on a regimen of: - Metformin 1000 mg with dinner (decreased 03/2016) - but has been taking it 2x a day occasionally  Pt checks her sugars 1x a day:  - am: 86-169 >> 83-122 >> 100-110 >> 88-118 >> 130, 131 >> 114-120, 141, 156 - 2h after b'fast: n/c - before lunch: n/c >> 81-89 >> n/c - 2h after lunch: 135 >> n/c >> 83-105, 154 >> n/c - before dinner: 82-109, 128, 144 >> 80-100 >> 78, 89-129 >> 82-114 >> 63, 75-132 - 2h after dinner: n/c >> 83 - bedtime: 141 >> 114-136 >> 115, 116 >> 94  - nighttime: n/c No lows. Lowest sugar was 86 >> 80s >> 78 >> 82 >> 63 x1; she has hypoglycemia awareness at 70.  Highest sugar was 500s >> 120 >> 129 >> 154 >> 156.  Glucometer: One Touch  Pt's meals are: - Breakfast: cheerios + raisin bran or cornflakes + 2% milk or bacon + egg + toast - Lunch: salad or soup or sandwich - Dinner: meat + veggies + starch - Snacks: no desserts; apple, chips She saw a dietitian 07/09/2015 - this helped a lot.   She was walking every day for exercise >> now less over the winter.   - no CKD, last BUN/creatinine:  Lab Results  Component Value Date   BUN 17 05/18/2016   CREATININE 0.83 05/18/2016   - last set of lipids: Lab Results  Component Value Date   CHOL 208 (H) 11/01/2015   HDL 40 11/01/2015   LDLCALC 131 (H) 11/01/2015   TRIG 183 (H) 11/01/2015   CHOLHDL 5.2 (H) 11/01/2015  Intolerant to statins: bone pain. I recommended Benecol at last visit >> still did not start.  - last eye exam was in 07/2016. No DR.  - no numbness and tingling in her  feet.  She also has hypertension , hyperlipidemia-intolerant to statins (atorvastatin, rosuvastatin), OSA, depression, osteoarthritis, spinal stenosis, osteopenia, GERD, CKD stage III. + h/o colon cancer.  ROS: Constitutional: no weight gain/loss, no fatigue, no subjective hyperthermia/hypothermia Eyes: no blurry vision, no xerophthalmia ENT: no sore throat, no nodules palpated in throat, no dysphagia/odynophagia, no hoarseness Cardiovascular: no CP/SOB/palpitations/leg swelling Respiratory: no cough/SOB Gastrointestinal: no N/V/D/C Musculoskeletal: no muscle/joint aches Skin: no rashes, + hair loss Neurological: no tremors/numbness/tingling/dizziness, + HA  I reviewed pt's medications, allergies, PMH, social hx, family hx, and changes were documented in the history of present illness. Otherwise, unchanged from my initial visit note.   Past Medical History:  Diagnosis Date  . Arthritis   . Colon cancer ([place])    colon ca dx 2008 and 2010  . Complication of anesthesia    problems waking up-patient thinks she was given too much medication  . Gestational diabetes   . Headache    headaches all the time  . Hypertension   . Sleep apnea    uses CPAP   Past Surgical History:  Procedure Laterality Date  . ABDOMINAL HYSTERECTOMY    . APPENDECTOMY    . BACK SURGERY  2009  .  CARPAL TUNNEL RELEASE    . COLON SURGERY    . COLONOSCOPY WITH PROPOFOL N/A 08/10/2015   Procedure: COLONOSCOPY WITH PROPOFOL;  Surgeon: Garlan Fair, MD;  Location: [facility];  Service: Endoscopy;  Laterality: N/A;  . DILATION AND CURETTAGE OF UTERUS     had before had hysterectomy  . ESOPHAGOGASTRODUODENOSCOPY (EGD) WITH PROPOFOL N/A 08/10/2015   Procedure: ESOPHAGOGASTRODUODENOSCOPY (EGD) WITH PROPOFOL;  Surgeon: Garlan Fair, MD;  Location: [facility];  Service: Endoscopy;  Laterality: N/A;  . EYE SURGERY     bilateral cataract surgery   . ROTATOR CUFF REPAIR     Social History   Social  History  . Marital status: Divorced    Spouse name: N/A  . Number of children: 4  . Years of education: 12+   Occupational History  . [employer]    Social History Main Topics  . Smoking status: Never Smoker  . Smokeless tobacco: Never Used     Comment: never used tobacco  . Alcohol use No  . Drug use: No  . Sexual activity: Not on file   Other Topics Concern  . Not on file   Social History Narrative   Lives at home with herself.   Caffeine use: Drinks coffee and tea (1 cup coffee per day). Tea rarely    Current Outpatient Prescriptions on File Prior to Visit  Medication Sig Dispense Refill  . amLODipine (NORVASC) 5 MG tablet Take 5 mg by mouth Daily.    . beta carotene w/minerals (OCUVITE) tablet Take 1 tablet by mouth daily.    . Cholecalciferol (VITAMIN D-3 PO) Take 2,000 Units by mouth daily.    Marland Kitchen FLUoxetine (PROZAC) 20 MG capsule Take 20 mg by mouth Daily.    . fluticasone (FLONASE) 50 MCG/ACT nasal spray Place 1 spray into the nose 2 (two) times daily as needed for allergies or rhinitis.     Marland Kitchen GARLIC OIL PO Take by mouth.    Marland Kitchen GLUCOSAMINE HCL PO Take 1 tablet by mouth daily.    Marland Kitchen levothyroxine (SYNTHROID, LEVOTHROID) 112 MCG tablet Take 112 mcg by mouth daily before breakfast.     . metFORMIN (GLUCOPHAGE) 1000 MG tablet Take 1 tablet (1,000 mg total) by mouth daily with supper. 90 tablet 3  . Multiple Vitamin (MULTIVITAMIN) capsule Take 1 capsule by mouth daily.    Glory Rosebush DELICA LANCETS 99991111 MISC Use 1x a day 100 each 11  . ONETOUCH VERIO test strip Check 1x a day 100 each 11  . polyvinyl alcohol (LIQUIFILM TEARS) 1.4 % ophthalmic solution Place 1 drop into both eyes as needed for dry eyes.    . Probiotic Product (PROBIOTIC DAILY) CAPS Take 1 capsule by mouth every morning.     . triamterene-hydrochlorothiazide (DYAZIDE) 37.5-25 MG per capsule Take 1 capsule by mouth Daily.     . TURMERIC PO Take by mouth.     No current facility-administered medications on file  prior to visit.    Allergies  Allergen Reactions  . Statins     Aches in her bones   Family History  Problem Relation Age of Onset  . Migraines Neg Hx    PE: BP 140/78 (BP Location: Left Arm, Patient Position: Sitting)   Pulse 65   Ht 5\' 2"  (1.575 m)   Wt 156 lb (70.8 kg)   SpO2 95%   BMI 28.53 kg/m  Body mass index is 28.53 kg/m. Wt Readings from Last 3 Encounters:  11/14/16 156 lb (  70.8 kg)  07/14/16 157 lb (71.2 kg)  05/18/16 153 lb (69.4 kg)   Constitutional: overweight, in NAD Eyes: PERRLA, EOMI, no exophthalmos ENT: moist mucous membranes, no thyromegaly, no cervical lymphadenopathy Cardiovascular: RRR, No MRG Respiratory: CTA B Gastrointestinal: abdomen soft, NT, ND, BS+ Musculoskeletal: + deformities ([place], Bouchard nodules), strength intact in all 4 Skin: moist, warm, no rashes Neurological: no tremor with outstretched hands, DTR normal in all 4  ASSESSMENT: 1. DM2, non-insulin-dependent, controlled, without complications  2. HL  PLAN:  1. Patient with h/o controlled, diabetes, on oral antidiabetic regimen (metformin) with great improvement in sugars after starting this, so that we could decrease the dose to 1000 mg daily. Since last visit, she has had higher sugars occasionally during the Holidays >> she occasionally doubled this dose.  - Last HbA1c was excellent, 6.0% - I suggested to:  Patient Instructions  Please continue Metformin 1000 mg with dinner.  Please return in 4 months with your sugar log.   - continue checking sugars at different times of the day - check once a day, rotating checks - advised for yearly eye exams >> she is UTD >> will call her eye Dr to send me the report - checked HbA1c today: 6.1% (excellent) - Return to clinic in 4 mo with sugar log   2. HL - she is intolerant to statins - I reviewed together her Lipid levels from 10/2015 - at last visits I suggested that she tried Benecol >> she still did not try this >> will  start  Philemon Kingdom, MD PhD [facility] Endocrinology

## 2016-11-14 NOTE — Addendum Note (Signed)
Addended by: Caprice Beaver T on: 11/14/2016 11:25 AM   Modules accepted: Orders

## 2016-11-20 ENCOUNTER — Ambulatory Visit (HOSPITAL_BASED_OUTPATIENT_CLINIC_OR_DEPARTMENT_OTHER): Payer: Medicare Other | Admitting: Hematology & Oncology

## 2016-11-20 ENCOUNTER — Other Ambulatory Visit (HOSPITAL_BASED_OUTPATIENT_CLINIC_OR_DEPARTMENT_OTHER): Payer: Medicare Other

## 2016-11-20 ENCOUNTER — Telehealth: Payer: Self-pay | Admitting: *Deleted

## 2016-11-20 VITALS — BP 147/61 | HR 54 | Temp 98.1°F | Resp 16 | Wt 158.1 lb

## 2016-11-20 DIAGNOSIS — M272 Inflammatory conditions of jaws: Secondary | ICD-10-CM

## 2016-11-20 DIAGNOSIS — Z85038 Personal history of other malignant neoplasm of large intestine: Secondary | ICD-10-CM

## 2016-11-20 DIAGNOSIS — C185 Malignant neoplasm of splenic flexure: Secondary | ICD-10-CM

## 2016-11-20 DIAGNOSIS — R1012 Left upper quadrant pain: Secondary | ICD-10-CM

## 2016-11-20 DIAGNOSIS — C184 Malignant neoplasm of transverse colon: Secondary | ICD-10-CM | POA: Insufficient documentation

## 2016-11-20 LAB — COMPREHENSIVE METABOLIC PANEL
ALBUMIN: 4.1 g/dL (ref 3.5–5.0)
ALT: 25 U/L (ref 0–55)
AST: 18 U/L (ref 5–34)
Alkaline Phosphatase: 50 U/L (ref 40–150)
Anion Gap: 8 mEq/L (ref 3–11)
BUN: 13.2 mg/dL (ref 7.0–26.0)
CO2: 27 mEq/L (ref 22–29)
Calcium: 9.9 mg/dL (ref 8.4–10.4)
Chloride: 101 mEq/L (ref 98–109)
Creatinine: 1 mg/dL (ref 0.6–1.1)
EGFR: 58 mL/min/{1.73_m2} — ABNORMAL LOW (ref >90)
Glucose: 117 mg/dl (ref 70–140)
POTASSIUM: 4 meq/L (ref 3.5–5.1)
Sodium: 137 mEq/L (ref 136–145)
Total Bilirubin: 0.5 mg/dL (ref 0.20–1.20)
Total Protein: 6.9 g/dL (ref 6.4–8.3)

## 2016-11-20 LAB — CBC WITH DIFFERENTIAL (CANCER CENTER ONLY)
BASO#: 0 10*3/uL (ref 0.0–0.2)
BASO%: 0.6 % (ref 0.0–2.0)
EOS%: 4.9 % (ref 0.0–7.0)
Eosinophils Absolute: 0.3 10*3/uL (ref 0.0–0.5)
HEMATOCRIT: 35.8 % (ref 34.8–46.6)
HGB: 12.3 g/dL (ref 11.6–15.9)
LYMPH#: 1.9 10*3/uL (ref 0.9–3.3)
LYMPH%: 36.9 % (ref 14.0–48.0)
MCH: 30.7 pg (ref 26.0–34.0)
MCHC: 34.4 g/dL (ref 32.0–36.0)
MCV: 89 fL (ref 81–101)
MONO#: 0.5 10*3/uL (ref 0.1–0.9)
MONO%: 10 % (ref 0.0–13.0)
NEUT#: 2.4 10*3/uL (ref 1.5–6.5)
NEUT%: 47.6 % (ref 39.6–80.0)
PLATELETS: 186 10*3/uL (ref 145–400)
RBC: 4.01 10*6/uL (ref 3.70–5.32)
RDW: 12.3 % (ref 11.1–15.7)
WBC: 5.1 10*3/uL (ref 3.9–10.0)

## 2016-11-20 LAB — CEA (IN HOUSE-CHCC): CEA (CHCC-In House): 2.18 ng/mL (ref 0.00–5.00)

## 2016-11-20 NOTE — Telephone Encounter (Addendum)
Patient is aware of results.   ----- Message from [name], MD sent at 11/20/2016  2:03 PM EST ----- Call - tumor level is normal!!  pete

## 2016-11-20 NOTE — Progress Notes (Signed)
Hematology and Oncology Follow Up Visit  MARIACHRISTINA RANN [id number] [birth year] 76 y.o. 11/20/2016   Principle Diagnosis:  History of locally recurrent adenocarcinoma of the colon  Current Therapy:    Observation     Interim History:  Ms.  Mears is back for follow-up. She is doing quite well. The problem is that she needs to have a root canal tomorrow. She has a tooth abscess in the right mandibular molar area. This is been going on for a few weeks. It is somewhat painful. I'm sure the root canal will help her.   She was having abdominal pain 100 last saw her. This is better. We did do a CT scan back in September. The CT scan did not show any evidence of recurrent disease. She has some hepatic steatosis.   Her last CEA back in August was 2.   She's had no problems with bowels or bladder. She's had no rashes. She's had no leg swelling. She's had no cough. She's got through the influenza season without any infection.   Her last mammogram was back in December. Everything looked okay. .   Overall, her performance status is ECOG 1.  Medications:  Current Outpatient Prescriptions:  .  amLODipine (NORVASC) 5 MG tablet, Take 5 mg by mouth Daily., Disp: , Rfl:  .  beta carotene w/minerals (OCUVITE) tablet, Take 1 tablet by mouth daily., Disp: , Rfl:  .  Cholecalciferol (VITAMIN D-3 PO), Take 2,000 Units by mouth daily., Disp: , Rfl:  .  FLUoxetine (PROZAC) 20 MG capsule, Take 20 mg by mouth Daily., Disp: , Rfl:  .  fluticasone (FLONASE) 50 MCG/ACT nasal spray, Place 1 spray into the nose 2 (two) times daily as needed for allergies or rhinitis. , Disp: , Rfl:  .  GARLIC OIL PO, Take by mouth., Disp: , Rfl:  .  GLUCOSAMINE HCL PO, Take 1 tablet by mouth daily., Disp: , Rfl:  .  levothyroxine (SYNTHROID, LEVOTHROID) 112 MCG tablet, Take 112 mcg by mouth daily before breakfast. , Disp: , Rfl:  .  metFORMIN (GLUCOPHAGE) 1000 MG tablet, Take 1 tablet (1,000 mg total) by mouth daily with supper.,  Disp: 90 tablet, Rfl: 3 .  Multiple Vitamin (MULTIVITAMIN) capsule, Take 1 capsule by mouth daily., Disp: , Rfl:  .  ONETOUCH DELICA LANCETS 99991111 MISC, Use 1x a day, Disp: 100 each, Rfl: 11 .  ONETOUCH VERIO test strip, Check 1x a day, Disp: 100 each, Rfl: 11 .  polyvinyl alcohol (LIQUIFILM TEARS) 1.4 % ophthalmic solution, Place 1 drop into both eyes as needed for dry eyes., Disp: , Rfl:  .  Probiotic Product (PROBIOTIC DAILY) CAPS, Take 1 capsule by mouth every morning. , Disp: , Rfl:  .  triamterene-hydrochlorothiazide (DYAZIDE) 37.5-25 MG per capsule, Take 1 capsule by mouth Daily. , Disp: , Rfl:  .  TURMERIC PO, Take by mouth., Disp: , Rfl:   Allergies:  Allergies  Allergen Reactions  . Statins     Aches in her bones    Past Medical History, Surgical history, Social history, and Family History were reviewed and updated.  Review of Systems: As above  Physical Exam:  weight is 158 lb 1.9 oz (71.7 kg). Her oral temperature is 98.1 F (36.7 C). Her blood pressure is 147/61 (abnormal) and her pulse is 54 (abnormal). Her respiration is 16 and oxygen saturation is 99%.   Well-developed and well-nourished white female in no obvious distress. Head and neck exam shows no ocular or oral lesions.  She has no adenopathy in the neck. Lungs are clear. Cardiac exam regular rate and rhythm with no murmurs, rubs or bruits. Abdomen is soft. She has some slight tenderness in the left upper quadrant. No obvious abdominal masses noted. There is no guarding or rebound tenderness. She has good bowel sounds. She has well-healed laparoscopy scars. She has no fluid wave. There is no palpable liver or spleen tip. Back exam shows no tenderness over the spine, ribs or hips. Externally shows no clubbing, cyanosis or edema. She does have osteoarthritic changes in her joints. Skin exam shows no suspicious rashes, ecchymoses or petechia. Neurological exam shows no focal neurological deficits.  Lab Results  Component  Value Date   WBC 5.1 11/20/2016   HGB 12.3 11/20/2016   HCT 35.8 11/20/2016   MCV 89 11/20/2016   PLT 186 11/20/2016     Chemistry      Component Value Date/Time   NA 138 05/18/2016 1505   NA 135 11/01/2015 0932   K 3.7 05/18/2016 1505   K 3.4 11/01/2015 0932   CL 102 05/18/2016 1505   CL 100 11/01/2015 0932   CO2 26 05/18/2016 1505   CO2 27 11/01/2015 0932   BUN 17 05/18/2016 1505   BUN 14 11/01/2015 0932   CREATININE 0.83 05/18/2016 1505   CREATININE 0.9 11/01/2015 0932      Component Value Date/Time   CALCIUM 10.0 05/18/2016 1505   CALCIUM 9.4 11/01/2015 0932   ALKPHOS 51 05/18/2016 1505   ALKPHOS 33 11/01/2015 0932   AST 19 05/18/2016 1505   AST 27 11/01/2015 0932   ALT 26 05/18/2016 1505   ALT 31 11/01/2015 0932   BILITOT 0.3 05/18/2016 1505   BILITOT 0.80 11/01/2015 0932         Impression and Plan: Ms. Hromadka is 76 year old white female. She is a past history of locally recurrent adenocarcinoma of the colon. She underwent resection back in 2000. She then received adjuvant FOLFOX.  -She looks quite good. I feel bad that she needs to have a root canal for one of her mandibular molars. This will make her feel a lot better.  I do not see any evidence of tumor recurrence.  We will plan to get her back in 6 months. If she has any problems between now and then, we will be more than happy to see her.  [name], MD 2/26/20189:43 AM

## 2016-11-21 LAB — CEA: CEA: 2.6 ng/mL (ref 0.0–4.7)

## 2017-03-14 ENCOUNTER — Other Ambulatory Visit: Payer: Self-pay | Admitting: Internal Medicine

## 2017-03-14 DIAGNOSIS — E1169 Type 2 diabetes mellitus with other specified complication: Secondary | ICD-10-CM

## 2017-03-14 DIAGNOSIS — E785 Hyperlipidemia, unspecified: Principal | ICD-10-CM

## 2017-04-04 ENCOUNTER — Encounter: Payer: Self-pay | Admitting: Internal Medicine

## 2017-04-04 ENCOUNTER — Ambulatory Visit (INDEPENDENT_AMBULATORY_CARE_PROVIDER_SITE_OTHER): Payer: Medicare Other | Admitting: Internal Medicine

## 2017-04-04 VITALS — BP 140/80 | HR 66 | Ht 62.0 in | Wt 160.0 lb

## 2017-04-04 DIAGNOSIS — E119 Type 2 diabetes mellitus without complications: Secondary | ICD-10-CM

## 2017-04-04 LAB — POCT GLYCOSYLATED HEMOGLOBIN (HGB A1C): HEMOGLOBIN A1C: 6.6

## 2017-04-04 MED ORDER — METFORMIN HCL 1000 MG PO TABS: 1000.0000 mg | ORAL_TABLET | Freq: Two times a day (BID) | ORAL | 3 refills | Status: DC

## 2017-04-04 NOTE — Addendum Note (Signed)
Addended by: Caprice Beaver T on: 04/04/2017 11:23 AM   Modules accepted: Orders

## 2017-04-04 NOTE — Progress Notes (Signed)
Patient ID: Robin [name], female   DOB: 03-Apr-1941, 76 y.o.   MRN: [id number]  HPI: Robin [name] is a 76 y.o.-year-old female, returning for f/u for DM2, initially GDM, then DM2 dx in 05/2015, insulin-independent, controlled, without complications. Last visit 4.5 mo ago.  Last hemoglobin A1c: Lab Results  Component Value Date   HGBA1C 6.1 11/14/2016   HGBA1C 6.0 07/14/2016   HGBA1C 5.8 04/07/2016   Pt is on a regimen of: - Metformin 1000 mg with dinner (decreased 03/2016) - but has been taking it 2x a day occasionally  Pt checks her sugars 1x a day - downloaded her meter:  - am: 100-110 >> 88-118 >> 130, 131 >> 114-120, 141, 156 >> 156 - 2h after b'fast: n/c - before lunch: n/c >> 81-89 >> n/c >> 92 - 2h after lunch: 135 >> n/c >> 83-105, 154 >> n/c - before dinner: 80-100 >> 78, 89-129 >> 82-114 >> 63, 75-132 >> 75-120 - 2h after dinner: n/c >> 83 >> 120-130 - bedtime: 141 >> 114-136 >> 115, 116 >> 94  >> n/c - nighttime: n/c + lows. Lowest sugar was 63 x1 >> 70s-80x; she has hypoglycemia awareness at 70.  Highest sugar was 156 >> 190s.  Glucometer: One Touch  Pt's meals are: - Breakfast: cheerios + raisin bran or cornflakes + 2% milk or bacon + egg + toast - Lunch: salad or soup or sandwich - Dinner: meat + veggies + starch - Snacks: no desserts; apple, chips She saw a dietitian 07/09/2015 - this helped a lot.   - No CKD, last BUN/creatinine:  Lab Results  Component Value Date   BUN 13.2 11/20/2016   CREATININE 1.0 11/20/2016   - last set of lipids: Lab Results  Component Value Date   CHOL 208 (H) 11/01/2015   HDL 40 11/01/2015   LDLCALC 131 (H) 11/01/2015   TRIG 183 (H) 11/01/2015   CHOLHDL 5.2 (H) 11/01/2015  Intolerant to statins: bone pain. She did not start Benechol.  - last eye exam was in 02/2017. No DR.  - denies numbness and tingling in her feet.  She also has hypertension , hyperlipidemia-intolerant to statins (atorvastatin, rosuvastatin), OSA,  depression, osteoarthritis, spinal stenosis, osteopenia, GERD, CKD stage III. + h/o colon cancer.  ROS: Constitutional: + weight gain, no fatigue, no subjective hyperthermia, no subjective hypothermia Eyes: no blurry vision, no xerophthalmia ENT: no sore throat, no nodules palpated in throat, no dysphagia, no odynophagia, no hoarseness Cardiovascular: no CP/no SOB/no palpitations/no leg swelling Respiratory: no cough/no SOB/no wheezing Gastrointestinal: no N/no V/no D/no C/no acid reflux Musculoskeletal: + muscle aches/+ joint aches Skin: no rashes, no hair loss Neurological: no tremors/no numbness/no tingling/no dizziness  I reviewed pt's medications, allergies, PMH, social hx, family hx, and changes were documented in the history of present illness. Otherwise, unchanged from my initial visit note.  Past Medical History:  Diagnosis Date  . Arthritis   . Colon cancer ([place])    colon ca dx 2008 and 2010  . Complication of anesthesia    problems waking up-patient thinks she was given too much medication  . Gestational diabetes   . Headache    headaches all the time  . Hypertension   . Sleep apnea    uses CPAP   Past Surgical History:  Procedure Laterality Date  . ABDOMINAL HYSTERECTOMY    . APPENDECTOMY    . BACK SURGERY  2009  . CARPAL TUNNEL RELEASE    . COLON SURGERY    .  COLONOSCOPY WITH PROPOFOL N/A 08/10/2015   Procedure: COLONOSCOPY WITH PROPOFOL;  Surgeon: Garlan Fair, MD;  Location: [facility];  Service: Endoscopy;  Laterality: N/A;  . DILATION AND CURETTAGE OF UTERUS     had before had hysterectomy  . ESOPHAGOGASTRODUODENOSCOPY (EGD) WITH PROPOFOL N/A 08/10/2015   Procedure: ESOPHAGOGASTRODUODENOSCOPY (EGD) WITH PROPOFOL;  Surgeon: Garlan Fair, MD;  Location: [facility];  Service: Endoscopy;  Laterality: N/A;  . EYE SURGERY     bilateral cataract surgery   . ROTATOR CUFF REPAIR     Social History   Social History  . Marital status: Divorced     Spouse name: N/A  . Number of children: 4  . Years of education: 12+   Occupational History  . [employer] room    Social History Main Topics  . Smoking status: Never Smoker  . Smokeless tobacco: Never Used     Comment: never used tobacco  . Alcohol use No  . Drug use: No  . Sexual activity: Not on file   Other Topics Concern  . Not on file   Social History Narrative   Lives at home with herself.   Caffeine use: Drinks coffee and tea (1 cup coffee per day). Tea rarely    Current Outpatient Prescriptions on File Prior to Visit  Medication Sig Dispense Refill  . amLODipine (NORVASC) 5 MG tablet Take 5 mg by mouth Daily.    . beta carotene w/minerals (OCUVITE) tablet Take 1 tablet by mouth daily.    . Cholecalciferol (VITAMIN D-3 PO) Take 2,000 Units by mouth daily.    Marland Kitchen FLUoxetine (PROZAC) 20 MG capsule Take 20 mg by mouth Daily.    . fluticasone (FLONASE) 50 MCG/ACT nasal spray Place 1 spray into the nose 2 (two) times daily as needed for allergies or rhinitis.     Marland Kitchen GARLIC OIL PO Take by mouth.    Marland Kitchen GLUCOSAMINE HCL PO Take 1 tablet by mouth daily.    Marland Kitchen levothyroxine (SYNTHROID, LEVOTHROID) 112 MCG tablet Take 112 mcg by mouth daily before breakfast.     . metFORMIN (GLUCOPHAGE) 1000 MG tablet Take 1 tablet (1,000 mg total) by mouth daily with supper. 90 tablet 3  . Multiple Vitamin (MULTIVITAMIN) capsule Take 1 capsule by mouth daily.    Glory Rosebush DELICA LANCETS 63O MISC USE ONE TIME A DAY 100 each 11  . ONETOUCH VERIO test strip USE TO CHECK ONE TIME A DAY 50 each 23  . polyvinyl alcohol (LIQUIFILM TEARS) 1.4 % ophthalmic solution Place 1 drop into both eyes as needed for dry eyes.    . Probiotic Product (PROBIOTIC DAILY) CAPS Take 1 capsule by mouth every morning.     . triamterene-hydrochlorothiazide (DYAZIDE) 37.5-25 MG per capsule Take 1 capsule by mouth Daily.     . TURMERIC PO Take by mouth.     No current facility-administered medications on file prior to visit.     Allergies  Allergen Reactions  . Statins     Aches in her bones   Family History  Problem Relation Age of Onset  . Migraines Neg Hx    PE: BP 140/80 (BP Location: Left Arm, Patient Position: Sitting)   Pulse 66   Ht 5\' 2"  (1.575 m)   Wt 160 lb (72.6 kg)   SpO2 93%   BMI 29.26 kg/m  Body mass index is 29.26 kg/m. Wt Readings from Last 3 Encounters:  04/04/17 160 lb (72.6 kg)  11/20/16 158 lb 1.9 oz (71.7  kg)  11/14/16 156 lb (70.8 kg)   Constitutional: overweight, in NAD Eyes: PERRLA, EOMI, no exophthalmos ENT: moist mucous membranes, no thyromegaly, no cervical lymphadenopathy Cardiovascular: RRR, No MRG Respiratory: CTA B Gastrointestinal: abdomen soft, NT, ND, BS+ Musculoskeletal: + deformities (Heber[place], Bouchard nodules),, strength intact in all 4 Skin: moist, warm, no rashes Neurological: no tremor with outstretched hands, DTR normal in all 4   ASSESSMENT: 1. DM2, non-insulin-dependent, controlled, without complications  2. HL  PLAN:  1. Patient with h/o controlled, diabetes, on oral antidiabetic regimen (metformin) with slightly higher sugars since last visit. She is not sure why. We have decreased her Metformin dose in the past 2/2 good control, but today I advised her to go back to bid dosing. - I suggested to:  Patient Instructions  Please continue Metformin 1000 mg with dinner.  Please return in 3-4 months with your sugar log.   - today, HbA1c is 6.6% (higher, but still at goal) - continue checking sugars at different times of the day - check 1x a day, rotating checks - advised for yearly eye exams >> she is UTD - Return to clinic in 3-38mo with sugar log    2. HL - she is intolerant to statins  Philemon Kingdom, MD PhD [facility]

## 2017-04-04 NOTE — Patient Instructions (Addendum)
Please increase Metformin to 1000 mg 2x a day with meals.  Please return in 3-4 months with your sugar log.

## 2017-05-21 ENCOUNTER — Other Ambulatory Visit (HOSPITAL_BASED_OUTPATIENT_CLINIC_OR_DEPARTMENT_OTHER): Payer: Medicare Other

## 2017-05-21 ENCOUNTER — Ambulatory Visit (HOSPITAL_BASED_OUTPATIENT_CLINIC_OR_DEPARTMENT_OTHER): Payer: Medicare Other | Admitting: Family

## 2017-05-21 VITALS — BP 181/64 | HR 56 | Temp 98.5°F | Resp 17 | Wt 156.0 lb

## 2017-05-21 DIAGNOSIS — C184 Malignant neoplasm of transverse colon: Secondary | ICD-10-CM

## 2017-05-21 DIAGNOSIS — Z85038 Personal history of other malignant neoplasm of large intestine: Secondary | ICD-10-CM | POA: Diagnosis not present

## 2017-05-21 LAB — CBC WITH DIFFERENTIAL (CANCER CENTER ONLY)
BASO#: 0 10*3/uL (ref 0.0–0.2)
BASO%: 0.2 % (ref 0.0–2.0)
EOS%: 1 % (ref 0.0–7.0)
Eosinophils Absolute: 0.1 10*3/uL (ref 0.0–0.5)
HCT: 37.9 % (ref 34.8–46.6)
HGB: 13 g/dL (ref 11.6–15.9)
LYMPH#: 2.7 10*3/uL (ref 0.9–3.3)
LYMPH%: 30.9 % (ref 14.0–48.0)
MCH: 30.5 pg (ref 26.0–34.0)
MCHC: 34.3 g/dL (ref 32.0–36.0)
MCV: 89 fL (ref 81–101)
MONO#: 0.9 10*3/uL (ref 0.1–0.9)
MONO%: 10 % (ref 0.0–13.0)
NEUT#: 5 10*3/uL (ref 1.5–6.5)
NEUT%: 57.9 % (ref 39.6–80.0)
Platelets: 203 10*3/uL (ref 145–400)
RBC: 4.26 10*6/uL (ref 3.70–5.32)
RDW: 12.7 % (ref 11.1–15.7)
WBC: 8.6 10*3/uL (ref 3.9–10.0)

## 2017-05-21 LAB — COMPREHENSIVE METABOLIC PANEL
ALK PHOS: 49 U/L (ref 40–150)
ALT: 33 U/L (ref 0–55)
AST: 22 U/L (ref 5–34)
Albumin: 4.2 g/dL (ref 3.5–5.0)
Anion Gap: 9 mEq/L (ref 3–11)
BUN: 20.8 mg/dL (ref 7.0–26.0)
CALCIUM: 10.5 mg/dL — AB (ref 8.4–10.4)
CHLORIDE: 102 meq/L (ref 98–109)
CO2: 28 mEq/L (ref 22–29)
Creatinine: 1 mg/dL (ref 0.6–1.1)
EGFR: 54 mL/min/{1.73_m2} — AB (ref >90)
GLUCOSE: 131 mg/dL (ref 70–140)
POTASSIUM: 4.3 meq/L (ref 3.5–5.1)
SODIUM: 139 meq/L (ref 136–145)
Total Bilirubin: 0.57 mg/dL (ref 0.20–1.20)
Total Protein: 7.4 g/dL (ref 6.4–8.3)

## 2017-05-21 LAB — CEA (IN HOUSE-CHCC): CEA (CHCC-IN HOUSE): 2.73 ng/mL (ref 0.00–5.00)

## 2017-05-21 NOTE — Progress Notes (Signed)
Hematology and Oncology Follow Up Visit  OPRAH CAMARENA [id number] May 11, 1941 76 y.o. 05/21/2017   Principle Diagnosis:  History of locally recurrent adenocarcinoma of the colon  Current Therapy:   Observation    Interim History:  Ms. Maroney is here today for follow-up. 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 She has no complaints at this time. CEA earlier this year in February was 2.6. No fever, chills, n/v, cough, rash, dizziness, SOB, chest pain, palpitations, abdominal pain or changes in bowel or bladder habits.  She has had no issue with frequent infections. No swelling, numbness or tingling in her extremities. She has some discomfort in her right shoulder and states that she received a steroid injection in that shoulder last week. She has history of rotator cuff repair on that side.  She has maintained a good appetite and is staying well hydrated. Her weight is stable.   ECOG Performance Status: 0 - Asymptomatic  Medications:  Allergies as of 05/21/2017      Reactions   Statins    Aches in her bones      Medication List       Accurate as of 05/21/17  8:52 AM. Always use your most recent med list.          amLODipine 5 MG tablet Commonly known as:  NORVASC Take 5 mg by mouth Daily.   beta carotene w/minerals tablet Take 1 tablet by mouth daily.   FLUoxetine 20 MG capsule Commonly known as:  PROZAC Take 20 mg by mouth Daily.   fluticasone 50 MCG/ACT nasal spray Commonly known as:  FLONASE Place 1 spray into the nose 2 (two) times daily as needed for allergies or rhinitis.   GARLIC OIL PO Take by mouth.   GLUCOSAMINE HCL PO Take 1 tablet by mouth daily.   levothyroxine 112 MCG tablet Commonly known as:  SYNTHROID, LEVOTHROID Take 112 mcg by mouth daily before breakfast.   metFORMIN 1000 MG tablet Commonly known as:  GLUCOPHAGE Take 1 tablet (1,000 mg total) by mouth 2 (two) times daily  with a meal.   multivitamin capsule Take 1 capsule by mouth daily.   ONETOUCH DELICA LANCETS 54Y Misc USE ONE TIME A DAY   ONETOUCH VERIO test strip Generic drug:  glucose blood USE TO CHECK ONE TIME A DAY   polyvinyl alcohol 1.4 % ophthalmic solution Commonly known as:  LIQUIFILM TEARS Place 1 drop into both eyes as needed for dry eyes.   PROBIOTIC DAILY Caps Take 1 capsule by mouth every morning.   triamterene-hydrochlorothiazide 37.5-25 MG capsule Commonly known as:  DYAZIDE Take 1 capsule by mouth Daily.   TURMERIC PO Take by mouth.   VITAMIN D-3 PO Take 2,000 Units by mouth daily.       Allergies:  Allergies  Allergen Reactions  . Statins     Aches in her bones    Past Medical History, Surgical history, Social history, and Family History were reviewed and updated.  Review of Systems: All other 10 point review of systems is negative.   Physical Exam:  vitals were not taken for this visit.  Wt Readings from Last 3 Encounters:  04/04/17 160 lb (72.6 kg)  11/20/16 158 lb 1.9 oz (71.7 kg)  11/14/16 156 lb (70.8 kg)    Ocular: Sclerae unicteric, pupils equal, round and reactive to light Ear-nose-throat: Oropharynx clear, dentition fair Lymphatic: No cervical, supraclavicular or axillary  adenopathy Lungs no rales or rhonchi, good excursion bilaterally Heart regular rate and rhythm, no murmur appreciated Abd soft, nontender, positive bowel sounds, no liver or spleen tip palpated on exam, no fluid wave  MSK no focal spinal tenderness, no joint edema Neuro: non-focal, well-oriented, appropriate affect Breasts: Deferred   Lab Results  Component Value Date   WBC 8.6 05/21/2017   HGB 13.0 05/21/2017   HCT 37.9 05/21/2017   MCV 89 05/21/2017   PLT 203 05/21/2017   Lab Results  Component Value Date   FERRITIN 250 10/14/2009   Lab Results  Component Value Date   RETICCTPCT 1.2 12/17/2008   RBC 4.26 05/21/2017   RETICCTABS 48.7 12/17/2008   No  results found for: KPAFRELGTCHN, LAMBDASER, KAPLAMBRATIO No results found for: IGGSERUM, IGA, IGMSERUM No results found for: Ronnald Ramp, A1GS, A2GS, Violet Baldy, MSPIKE, SPEI   Chemistry      Component Value Date/Time   NA 137 11/20/2016 0900   K 4.0 11/20/2016 0900   CL 102 05/18/2016 1505   CL 100 11/01/2015 0932   CO2 27 11/20/2016 0900   BUN 13.2 11/20/2016 0900   CREATININE 1.0 11/20/2016 0900      Component Value Date/Time   CALCIUM 9.9 11/20/2016 0900   ALKPHOS 50 11/20/2016 0900   AST 18 11/20/2016 0900   ALT 25 11/20/2016 0900   BILITOT 0.50 11/20/2016 0900      Impression and Plan: MS. Stannard is a very pleasant 76 yo caucasian female with history of locally recurrent adenocarcinoma of the colon with resection in 2000. She received adjuvant FOLFOX. She is doing well and has no complaints at this time.  CEA result for today is pending.  We will continue to follow along with her and plan to see her back again in another 6 months.  She promises to contact our office with any questions or concerns. We can certainly see her sooner if need be.   Eliezer Bottom, NP 8/27/20188:52 AM

## 2017-05-22 ENCOUNTER — Telehealth: Payer: Self-pay | Admitting: Internal Medicine

## 2017-05-22 NOTE — Telephone Encounter (Signed)
Patient requesting a call from nurse to discuss her metFORMIN (GLUCOPHAGE) 1000 MG tablet. Patient does not think the medication is working due to her blood sugar running around 147 and having nausea. Call patient before 12p at [phone] or after 2p at [phone].

## 2017-05-22 NOTE — Telephone Encounter (Signed)
Called patient and she did not answer at either line. Advised her to call back to discuss, gave call back number.

## 2017-05-23 ENCOUNTER — Telehealth: Payer: Self-pay

## 2017-05-23 NOTE — Telephone Encounter (Signed)
Called patient and advised of issues. Patient states that on the 26th- 71 @ 4:00 pm; on 27th- 114 @ 4:00 pm; yesterday at bedtime it was 147. Patient states she is having severe dry mouth, and nausea spells at time. Please advise, she feels that Metformin is not working.

## 2017-05-23 NOTE — Telephone Encounter (Signed)
Patient requesting a call back on her mobile [phone]. Call to advise.

## 2017-05-23 NOTE — Telephone Encounter (Signed)
Called patient and advised of MD note.

## 2017-05-23 NOTE — Telephone Encounter (Signed)
Routing to you °

## 2017-05-23 NOTE — Telephone Encounter (Signed)
Let's back off the Metformin to 500 mg dailly

## 2017-07-26 ENCOUNTER — Other Ambulatory Visit: Payer: Self-pay | Admitting: Family Medicine

## 2017-07-26 DIAGNOSIS — Z1231 Encounter for screening mammogram for malignant neoplasm of breast: Secondary | ICD-10-CM

## 2017-08-06 ENCOUNTER — Encounter: Payer: Self-pay | Admitting: Internal Medicine

## 2017-08-06 ENCOUNTER — Ambulatory Visit: Payer: Medicare Other | Admitting: Internal Medicine

## 2017-08-06 VITALS — BP 148/70 | HR 63 | Temp 98.1°F | Wt 160.4 lb

## 2017-08-06 DIAGNOSIS — E119 Type 2 diabetes mellitus without complications: Secondary | ICD-10-CM

## 2017-08-06 DIAGNOSIS — E1169 Type 2 diabetes mellitus with other specified complication: Secondary | ICD-10-CM

## 2017-08-06 DIAGNOSIS — E785 Hyperlipidemia, unspecified: Secondary | ICD-10-CM | POA: Diagnosis not present

## 2017-08-06 LAB — POCT GLYCOSYLATED HEMOGLOBIN (HGB A1C)

## 2017-08-06 NOTE — Progress Notes (Signed)
Patient ID: Robin [name], female   DOB: 1941-07-22, 76 y.o.   MRN: [id number]  HPI: Robin [name] is a 76 y.o.-year-old female, returning for f/u for DM2, initially GDM, then DM2 dx in 05/2015, insulin-independent, controlled, without long term complications. Last visit 4 mo ago.  Last hemoglobin A1c: Lab Results  Component Value Date   HGBA1C 6.6 04/04/2017   HGBA1C 6.1 11/14/2016   HGBA1C 6.0 07/14/2016   Pt is on a regimen of: - Metformin 1000 mg with dinner >> 1000 mg 2x a day (increased at last visit) >> now 1000 mg daily - dose decreased 04/2017 2/2 diarrhea, dry mouth >> both improved  Pt checks her sugars 1x a day: - am: 114-120, 141, 156 >> 156 >> n/c - 2h after b'fast: n/c - before lunch:n/c >> 92 >> n/c - 2h after lunch: 183-105, 154 >> n/c - before dinner: 63, 75-132 >> 75-120 >> 70s, 110-120, 146 - 2h after dinner:  83 >> 120-130 >> n/c - bedtime: 115, 116 >> 94  >> n/c - nighttime: n/c Lowest sugar was 63 x1 >> 70s-80s >> 70s; she has hypoglycemia awareness at 70.  Highest sugar was 156 >> 190s >> 146.  Glucometer: One Touch  Pt's meals are: - Breakfast: cheerios + raisin bran or cornflakes + 2% milk or bacon + egg + toast - Lunch: salad or soup or sandwich - Dinner: meat + veggies + starch - Snacks: no desserts; apple, chips She saw a dietitian 07/09/2015 - this helped a lot.   - No CKD, last BUN/creatinine:  Lab Results  Component Value Date   BUN 20.8 05/21/2017   CREATININE 1.0 05/21/2017   - last set of lipids: Lab Results  Component Value Date   CHOL 208 (H) 11/01/2015   HDL 40 11/01/2015   LDLCALC 131 (H) 11/01/2015   TRIG 183 (H) 11/01/2015   CHOLHDL 5.2 (H) 11/01/2015  Intolerant to statins >> bone pain.  She did not like Benechol.  - last eye exam was in 02/2017 >> No DR.  -Denies numbness and tingling in her feet.  She also has hypertension , hyperlipidemia-intolerant to statins (atorvastatin, rosuvastatin), OSA, depression,  osteoarthritis, spinal stenosis, osteopenia, GERD, CKD stage III. + h/o colon cancer.  ROS: Constitutional: no weight gain/no weight loss, no fatigue, no subjective hyperthermia, no subjective hypothermia Eyes: no blurry vision, no xerophthalmia ENT: no sore throat, no nodules palpated in throat, no dysphagia, no odynophagia, no hoarseness Cardiovascular: no CP/no SOB/no palpitations/no leg swelling Respiratory: no cough/no SOB/no wheezing Gastrointestinal: no N/no V/no D/no C/no acid reflux Musculoskeletal: no muscle aches/no joint aches Skin: no rashes, + hair loss Neurological: no tremors/no numbness/no tingling/no dizziness  I reviewed pt's medications, allergies, PMH, social hx, family hx, and changes were documented in the history of present illness. Otherwise, unchanged from my initial visit note.  Past Medical History:  Diagnosis Date  . Arthritis   . Colon cancer ([place])    colon ca dx 2008 and 2010  . Complication of anesthesia    problems waking up-patient thinks she was given too much medication  . Gestational diabetes   . Headache    headaches all the time  . Hypertension   . Sleep apnea    uses CPAP   Past Surgical History:  Procedure Laterality Date  . ABDOMINAL HYSTERECTOMY    . APPENDECTOMY    . BACK SURGERY  2009  . CARPAL TUNNEL RELEASE    . COLON SURGERY    .  DILATION AND CURETTAGE OF UTERUS     had before had hysterectomy  . EYE SURGERY     bilateral cataract surgery   . ROTATOR CUFF REPAIR     Social History   Socioeconomic History  . Marital status: Divorced    Spouse name: Not on file  . Number of children: 4  . Years of education: 12+  . Highest education level: Not on file  Social Needs  . Financial resource strain: Not on file  . Food insecurity - worry: Not on file  . Food insecurity - inability: Not on file  . Transportation needs - medical: Not on file  . Transportation needs - non-medical: Not on file  Occupational History  .  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 Smoking status: Never Smoker  . Smokeless tobacco: Never Used  . Tobacco comment: never used tobacco  Substance and Sexual Activity  . Alcohol use: No    Alcohol/week: 0.0 oz  . Drug use: No  . Sexual activity: Not on file  Other Topics Concern  . Not on file  Social History Narrative   Lives at home with herself.   Caffeine use: Drinks coffee and tea (1 cup coffee per day). Tea rarely    Current Outpatient Medications on File Prior to Visit  Medication Sig Dispense Refill  . amLODipine (NORVASC) 5 MG tablet Take 5 mg by mouth Daily.    . beta carotene w/minerals (OCUVITE) tablet Take 1 tablet by mouth daily.    . Cholecalciferol (VITAMIN D-3 PO) Take 2,000 Units by mouth daily.    Marland Kitchen FLUoxetine (PROZAC) 20 MG capsule Take 20 mg by mouth Daily.    . fluticasone (FLONASE) 50 MCG/ACT nasal spray Place 1 spray into the nose 2 (two) times daily as needed for allergies or rhinitis.     Marland Kitchen GARLIC OIL PO Take by mouth.    Marland Kitchen GLUCOSAMINE HCL PO Take 1 tablet by mouth daily.    Marland Kitchen levothyroxine (SYNTHROID, LEVOTHROID) 112 MCG tablet Take 112 mcg by mouth daily before breakfast.     . metFORMIN (GLUCOPHAGE) 1000 MG tablet Take 1 tablet (1,000 mg total) by mouth 2 (two) times daily with a meal. 180 tablet 3  . Multiple Vitamin (MULTIVITAMIN) capsule Take 1 capsule by mouth daily.    Glory Rosebush DELICA LANCETS 31S MISC USE ONE TIME A DAY 100 each 11  . ONETOUCH VERIO test strip USE TO CHECK ONE TIME A DAY 50 each 23  . polyvinyl alcohol (LIQUIFILM TEARS) 1.4 % ophthalmic solution Place 1 drop into both eyes as needed for dry eyes.    . Probiotic Product (PROBIOTIC DAILY) CAPS Take 1 capsule by mouth every morning.     . triamterene-hydrochlorothiazide (DYAZIDE) 37.5-25 MG per capsule Take 1 capsule by mouth Daily.     . TURMERIC PO Take by mouth.     No current facility-administered medications on file prior to visit.    Allergies  Allergen Reactions   . Statins     Aches in her bones   Family History  Problem Relation Age of Onset  . Migraines Neg Hx    PE: BP (!) 148/70 (BP Location: Left Arm, Patient Position: Sitting, Cuff Size: Normal) Comment: not taken blood pressure medication  Pulse 63   Temp 98.1 F (36.7 C) (Oral)   Wt 160 lb 6 oz (72.7 kg)   BMI 29.33 kg/m  Body mass index is 29.33 kg/m. Wt Readings from Last 3 Encounters:  08/06/17 160 lb 6 oz (72.7 kg)  05/21/17 156 lb (70.8 kg)  04/04/17 160 lb (72.6 kg)   Constitutional: overweight, in NAD Eyes: PERRLA, EOMI, no exophthalmos ENT: moist mucous membranes, no thyromegaly, no cervical lymphadenopathy Cardiovascular: RRR, No MRG Respiratory: CTA B Gastrointestinal: abdomen soft, NT, ND, BS+ Musculoskeletal: + deformities (Heber[place], Bouchard nodules), strength intact in all 4 Skin: moist, warm, no rashes Neurological: no tremor with outstretched hands, DTR normal in all 4   ASSESSMENT: 1. DM2, non-insulin-dependent, controlled, without long-term complications  2. HL  PLAN:  1. Patient with h/o controlled diabetes, on oral antidiabetic regimen, with metformin.  Since last visit, we had to back off the metformin dose as she developed diarrhea and also c/o dry mouth >> now on 1000 mg daily >> asymptomatic - sugars are at or close to goal, but she is only checking before dinner >> advised to check at other times of the day, also - At last visit, her HbA1c was still at goal, but higher, at 6.6%. - I suggested to:  Patient Instructions  Please continue Metformin 1000 mg with dinner.  Please return in 3-4 months with your sugar log.   - today, HbA1c is 6.5% (at goal) - continue checking sugars at different times of the day - check 1x a day, rotating checks - advised for yearly eye exams >> she is UTD, but needs one soon - Return to clinic in 3-4 mo with sugar log    2. HL - reviewed latest Lipid panel >> had a more recent level by PCP >> will try to get  records - She is intolerant to statins - tried Benechol spread >> did not like it   Addendum: Most recent Lipid panel: 10/10/2016: 246/381/40/130  (T chol and Tg are worse)  Philemon Kingdom, MD PhD [facility]

## 2017-08-06 NOTE — Patient Instructions (Addendum)
Please continue Metformin 1000 mg with dinner.  Please return in 3-4 months with your sugar log.

## 2017-08-31 ENCOUNTER — Other Ambulatory Visit: Payer: Self-pay | Admitting: Family Medicine

## 2017-08-31 ENCOUNTER — Ambulatory Visit
Admission: RE | Admit: 2017-08-31 | Discharge: 2017-08-31 | Disposition: A | Discharge status: Home / Self Care | Payer: Medicare Other | Source: Ambulatory Visit | Attending: Family Medicine | Admitting: Family Medicine

## 2017-08-31 DIAGNOSIS — N6459 Other signs and symptoms in breast: Secondary | ICD-10-CM

## 2017-08-31 DIAGNOSIS — Z1231 Encounter for screening mammogram for malignant neoplasm of breast: Secondary | ICD-10-CM

## 2017-09-12 ENCOUNTER — Ambulatory Visit
Admission: RE | Admit: 2017-09-12 | Discharge: 2017-09-12 | Disposition: A | Discharge status: Home / Self Care | Payer: Medicare Other | Source: Ambulatory Visit | Attending: Family Medicine | Admitting: Family Medicine

## 2017-09-12 DIAGNOSIS — N6459 Other signs and symptoms in breast: Secondary | ICD-10-CM

## 2017-10-03 LAB — HM DIABETES EYE EXAM

## 2017-11-19 ENCOUNTER — Other Ambulatory Visit: Payer: Self-pay

## 2017-11-19 ENCOUNTER — Encounter: Payer: Self-pay | Admitting: Hematology & Oncology

## 2017-11-19 ENCOUNTER — Other Ambulatory Visit: Payer: Medicare Other

## 2017-11-19 ENCOUNTER — Inpatient Hospital Stay: Payer: Medicare Other | Attending: Hematology & Oncology

## 2017-11-19 ENCOUNTER — Ambulatory Visit: Payer: Medicare Other | Admitting: Hematology & Oncology

## 2017-11-19 ENCOUNTER — Inpatient Hospital Stay (HOSPITAL_BASED_OUTPATIENT_CLINIC_OR_DEPARTMENT_OTHER): Payer: Medicare Other | Admitting: Hematology & Oncology

## 2017-11-19 VITALS — BP 152/68 | HR 60 | Temp 98.6°F | Resp 20 | Wt 159.0 lb

## 2017-11-19 DIAGNOSIS — Z85038 Personal history of other malignant neoplasm of large intestine: Secondary | ICD-10-CM

## 2017-11-19 DIAGNOSIS — R42 Dizziness and giddiness: Secondary | ICD-10-CM | POA: Diagnosis not present

## 2017-11-19 DIAGNOSIS — C184 Malignant neoplasm of transverse colon: Secondary | ICD-10-CM

## 2017-11-19 DIAGNOSIS — Z7984 Long term (current) use of oral hypoglycemic drugs: Secondary | ICD-10-CM | POA: Diagnosis not present

## 2017-11-19 LAB — CBC WITH DIFFERENTIAL (CANCER CENTER ONLY)
Basophils Absolute: 0 10*3/uL (ref 0.0–0.1)
Basophils Relative: 0 %
EOS ABS: 0.3 10*3/uL (ref 0.0–0.5)
EOS PCT: 4 %
HCT: 36.9 % (ref 34.8–46.6)
Hemoglobin: 12.6 g/dL (ref 11.6–15.9)
LYMPHS ABS: 2.1 10*3/uL (ref 0.9–3.3)
LYMPHS PCT: 37 %
MCH: 30.9 pg (ref 26.0–34.0)
MCHC: 34.1 g/dL (ref 32.0–36.0)
MCV: 90.4 fL (ref 81.0–101.0)
MONO ABS: 0.5 10*3/uL (ref 0.1–0.9)
MONOS PCT: 9 %
Neutro Abs: 2.8 10*3/uL (ref 1.5–6.5)
Neutrophils Relative %: 50 %
PLATELETS: 194 10*3/uL (ref 145–400)
RBC: 4.08 MIL/uL (ref 3.70–5.32)
RDW: 12.6 % (ref 11.1–15.7)
WBC Count: 5.8 10*3/uL (ref 3.9–10.0)

## 2017-11-19 LAB — CMP (CANCER CENTER ONLY)
ALBUMIN: 4.3 g/dL (ref 3.5–5.0)
ALT: 45 U/L (ref 0–55)
AST: 38 U/L — AB (ref 5–34)
Alkaline Phosphatase: 49 U/L (ref 40–150)
Anion gap: 10 (ref 3–11)
BUN: 16 mg/dL (ref 7–26)
CHLORIDE: 102 mmol/L (ref 98–109)
CO2: 27 mmol/L (ref 22–29)
Calcium: 11.2 mg/dL — ABNORMAL HIGH (ref 8.4–10.4)
Creatinine: 1.11 mg/dL — ABNORMAL HIGH (ref 0.60–1.10)
GFR, EST NON AFRICAN AMERICAN: 47 mL/min — AB (ref >60)
GFR, Est AFR Am: 54 mL/min — ABNORMAL LOW (ref >60)
GLUCOSE: 155 mg/dL — AB (ref 70–140)
Potassium: 4.2 mmol/L (ref 3.5–5.1)
SODIUM: 139 mmol/L (ref 136–145)
Total Bilirubin: 0.6 mg/dL (ref 0.2–1.2)
Total Protein: 7.3 g/dL (ref 6.4–8.3)

## 2017-11-19 LAB — CEA (IN HOUSE-CHCC): CEA (CHCC-IN HOUSE): 2.01 ng/mL (ref 0.00–5.00)

## 2017-11-19 NOTE — Progress Notes (Signed)
Hematology and Oncology Follow Up Visit  Robin [name] [id number] 04-Apr-1941 77 y.o. 11/19/2017   Principle Diagnosis:  History of locally recurrent adenocarcinoma of the colon  Current Therapy:   Observation    Interim History:  Robin [name] is here today for follow-up.  We see her every 6 months.  Since we last saw her, she is been doing fairly well.  She has had noted a little bit of dizziness.  This comes and goes.  It is not related to him anytime of day.  She does not fall.  She has not passed out.  She had a very nice Thanksgiving and Christmas.  She has had no problems with bowels or bladder.   She said that she is having an upper endoscopy this week.  There is been no bleeding.  She has had no leg swelling.   Overall, her performance status is ECOG 1.  ECOG Performance Status: 0 - Asymptomatic  Medications:  Allergies as of 11/19/2017      Reactions   Statins    Aches in her bones      Medication List        Accurate as of 11/19/17  9:52 AM. Always use your most recent med list.          amLODipine 5 MG tablet Commonly known as:  NORVASC Take 5 mg by mouth Daily.   beta carotene w/minerals tablet Take 1 tablet by mouth daily.   fenofibrate 54 MG tablet 54 mg daily.   FLUoxetine 20 MG capsule Commonly known as:  PROZAC Take 20 mg by mouth Daily.   fluticasone 50 MCG/ACT nasal spray Commonly known as:  FLONASE Place 1 spray into the nose 2 (two) times daily as needed for allergies or rhinitis.   GARLIC OIL PO Take by mouth.   GLUCOSAMINE HCL PO Take 1 tablet by mouth daily.   levothyroxine 112 MCG tablet Commonly known as:  SYNTHROID, LEVOTHROID Take 112 mcg by mouth daily before breakfast.   metFORMIN 1000 MG tablet Commonly known as:  GLUCOPHAGE Take 1 tablet (1,000 mg total) by mouth 2 (two) times daily with a meal.   multivitamin capsule Take 1 capsule by mouth daily.   ONETOUCH DELICA LANCETS 84X Misc USE ONE TIME A DAY     ONETOUCH VERIO test strip Generic drug:  glucose blood USE TO CHECK ONE TIME A DAY   polyvinyl alcohol 1.4 % ophthalmic solution Commonly known as:  LIQUIFILM TEARS Place 1 drop into both eyes as needed for dry eyes.   PROBIOTIC DAILY Caps Take 1 capsule by mouth every morning.   triamterene-hydrochlorothiazide 37.5-25 MG capsule Commonly known as:  DYAZIDE Take 1 capsule by mouth Daily.   TURMERIC PO Take by mouth.   VITAMIN D-3 PO Take 2,000 Units by mouth daily.       Allergies:  Allergies  Allergen Reactions  . Statins     Aches in her bones    Past Medical History, Surgical history, Social history, and Family History were reviewed and updated.  Review of Systems: Review of Systems  Constitutional: Negative.   HENT: Negative.   Eyes: Negative.   Respiratory: Negative.   Cardiovascular: Negative.   Gastrointestinal: Negative.   Genitourinary: Negative.   Musculoskeletal: Negative.   Skin: Negative.   Endo/Heme/Allergies: Negative.   Psychiatric/Behavioral: Negative.     Physical Exam:  weight is 159 lb (72.1 kg). Her oral temperature is 98.6 F (37 C). Her blood pressure is 152/68 (abnormal)  and her pulse is 60. Her respiration is 20 and oxygen saturation is 98%.   Wt Readings from Last 3 Encounters:  11/19/17 159 lb (72.1 kg)  08/06/17 160 lb 6 oz (72.7 kg)  05/21/17 156 lb (70.8 kg)    Physical Exam  Constitutional: She is oriented to person, place, and time.  HENT:  Head: Normocephalic and atraumatic.  Mouth/Throat: Oropharynx is clear and moist.  Eyes: EOM are normal. Pupils are equal, round, and reactive to light.  Neck: Normal range of motion.  Cardiovascular: Normal rate, regular rhythm and normal heart sounds.  Pulmonary/Chest: Effort normal and breath sounds normal.  Abdominal: Soft. Bowel sounds are normal.  Musculoskeletal: Normal range of motion. She exhibits no edema, tenderness or deformity.  Lymphadenopathy:    She has no  cervical adenopathy.  Neurological: She is alert and oriented to person, place, and time.  Skin: Skin is warm and dry. No rash noted. No erythema.  Psychiatric: She has a normal mood and affect. Her behavior is normal. Judgment and thought content normal.  Vitals reviewed.    Lab Results  Component Value Date   WBC 5.8 11/19/2017   HGB 13.0 05/21/2017   HCT 36.9 11/19/2017   MCV 90.4 11/19/2017   PLT 194 11/19/2017   Lab Results  Component Value Date   FERRITIN 250 10/14/2009   Lab Results  Component Value Date   RETICCTPCT 1.2 12/17/2008   RBC 4.08 11/19/2017   RETICCTABS 48.7 12/17/2008   No results found for: KPAFRELGTCHN, LAMBDASER, KAPLAMBRATIO No results found for: IGGSERUM, IGA, IGMSERUM No results found for: Odetta Pink, SPEI   Chemistry      Component Value Date/Time   NA 139 05/21/2017 0838   K 4.3 05/21/2017 0838   CL 102 05/18/2016 1505   CL 100 11/01/2015 0932   CO2 28 05/21/2017 0838   BUN 20.8 05/21/2017 0838   CREATININE 1.0 05/21/2017 0838      Component Value Date/Time   CALCIUM 10.5 (H) 05/21/2017 0838   ALKPHOS 49 05/21/2017 0838   AST 22 05/21/2017 0838   ALT 33 05/21/2017 0838   BILITOT 0.57 05/21/2017 0838      Impression and Plan: Robin [name] is a very pleasant 77 yo caucasian female with history of locally recurrent adenocarcinoma of the colon with resection in 2000. She received adjuvant FOLFOX. She is doing well and has no complaints at this time.   CEA result for today is pending.   We will continue to follow along with her and plan to see her back again in another 6 months.   She promises to contact our office with any questions or concerns. We can certainly see her sooner if need be.   Robin Napoleon, MD 2/25/20199:52 AM

## 2017-12-04 ENCOUNTER — Ambulatory Visit: Payer: Medicare Other | Admitting: Internal Medicine

## 2017-12-04 NOTE — Progress Notes (Deleted)
Patient ID: Robin [name], female   DOB: May 07, 1941, 77 y.o.   MRN: [id number]  HPI: Robin [name] is a 77 y.o.-year-old female, returning for f/u for DM2, initially GDM, then DM2 dx in 05/2015, insulin-independent, controlled, without long term complications. Last visit 4 mo ago.  Last hemoglobin A1c: Lab Results  Component Value Date   HGBA1C 6.5% 08/06/2017   HGBA1C 6.6 04/04/2017   HGBA1C 6.1 11/14/2016   Pt is on a regimen of: - Metformin 1000 mg with dinner > - dose decreased 04/2017 2/2 diarrhea, dry mouth >> both improved after stopping metformin  Pt checks her sugars once a day: - am: 114-120, 141, 156 >> 156 >> n/c - 2h after b'fast: n/c - before lunch:n/c >> 92 >> n/c - 2h after lunch: 183-105, 154 >> n/c - before dinner: 75-120 >> 70s, 110-120, 146 - 2h after dinner:  83 >> 120-130 >> n/c - bedtime: 115, 116 >> 94  >> n/c - nighttime: n/c Lowest sugar was 70s >> ***; she has hypoglycemia awareness in the 70s. Highest sugar was 146 >> ***.  Glucometer: One Touch  Pt's meals are: - Breakfast: cheerios + raisin bran or cornflakes + 2% milk or bacon + egg + toast - Lunch: salad or soup or sandwich - Dinner: meat + veggies + starch - Snacks: no desserts; apple, chips She saw a dietitian 07/09/2015 -this helped a lot.  -No history of CKD, last BUN/creatinine:  Lab Results  Component Value Date   BUN 16 11/19/2017   CREATININE 1.11 (H) 11/19/2017   -+ HL; last set of lipids: Lab Results  Component Value Date   CHOL 208 (H) 11/01/2015   HDL 40 11/01/2015   LDLCALC 131 (H) 11/01/2015   TRIG 183 (H) 11/01/2015   CHOLHDL 5.2 (H) 11/01/2015  She is intolerant to statins (atorvastatin, rosuvastatin) -bone pain.  She did not like Benecol spread.  She takes garlic.  - last eye exam was in 02/2017: No DR.  -She denies numbness and tingling in her feet.  She also has hypertension, OSA, depression, osteoarthritis, spinal stenosis, osteopenia, GERD, CKD stage III. +  h/o colon cancer.  ROS: Constitutional: no weight gain/no weight loss, no fatigue, no subjective hyperthermia, no subjective hypothermia Eyes: no blurry vision, no xerophthalmia ENT: no sore throat, no nodules palpated in throat, no dysphagia, no odynophagia, no hoarseness Cardiovascular: no CP/no SOB/no palpitations/no leg swelling Respiratory: no cough/no SOB/no wheezing Gastrointestinal: no N/no V/no D/no C/no acid reflux Musculoskeletal: no muscle aches/no joint aches Skin: no rashes, no hair loss Neurological: no tremors/no numbness/no tingling/no dizziness  I reviewed pt's medications, allergies, PMH, social hx, family hx, and changes were documented in the history of present illness. Otherwise, unchanged from my initial visit note.  Past Medical History:  Diagnosis Date  . Arthritis   . Colon cancer ([place])    colon ca dx 2008 and 2010  . Complication of anesthesia    problems waking up-patient thinks she was given too much medication  . Gestational diabetes   . Headache    headaches all the time  . Hypertension   . Sleep apnea    uses CPAP   Past Surgical History:  Procedure Laterality Date  . ABDOMINAL HYSTERECTOMY    . APPENDECTOMY    . BACK SURGERY  2009  . CARPAL TUNNEL RELEASE    . COLON SURGERY    . COLONOSCOPY WITH PROPOFOL N/A 08/10/2015   Procedure: COLONOSCOPY WITH PROPOFOL;  Surgeon: Hassell Done  Sandria Senter, MD;  Location: [facility];  Service: Endoscopy;  Laterality: N/A;  . DILATION AND CURETTAGE OF UTERUS     had before had hysterectomy  . ESOPHAGOGASTRODUODENOSCOPY (EGD) WITH PROPOFOL N/A 08/10/2015   Procedure: ESOPHAGOGASTRODUODENOSCOPY (EGD) WITH PROPOFOL;  Surgeon: Garlan Fair, MD;  Location: [facility];  Service: Endoscopy;  Laterality: N/A;  . EYE SURGERY     bilateral cataract surgery   . ROTATOR CUFF REPAIR     Social History   Socioeconomic History  . Marital status: Divorced    Spouse name: Not on file  . Number of children: 4  .  Years of education: 12+  . Highest education level: Not on file  Social Needs  . Financial resource strain: Not on file  . Food insecurity - worry: Not on file  . Food insecurity - inability: Not on file  . Transportation needs - medical: Not on file  . Transportation needs - non-medical: Not on file  Occupational History  . 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 Smoking status: Never Smoker  . Smokeless tobacco: Never Used  . Tobacco comment: never used tobacco  Substance and Sexual Activity  . Alcohol use: No    Alcohol/week: 0.0 oz  . Drug use: No  . Sexual activity: Not on file  Other Topics Concern  . Not on file  Social History Narrative   Lives at home with herself.   Caffeine use: Drinks coffee and tea (1 cup coffee per day). Tea rarely    Current Outpatient Medications on File Prior to Visit  Medication Sig Dispense Refill  . amLODipine (NORVASC) 5 MG tablet Take 5 mg by mouth Daily.    . beta carotene w/minerals (OCUVITE) tablet Take 1 tablet by mouth daily.    . Cholecalciferol (VITAMIN D-3 PO) Take 2,000 Units by mouth daily.    . fenofibrate 54 MG tablet 54 mg daily.    Marland Kitchen FLUoxetine (PROZAC) 20 MG capsule Take 20 mg by mouth Daily.    . fluticasone (FLONASE) 50 MCG/ACT nasal spray Place 1 spray into the nose 2 (two) times daily as needed for allergies or rhinitis.     Marland Kitchen GARLIC OIL PO Take by mouth.    Marland Kitchen GLUCOSAMINE HCL PO Take 1 tablet by mouth daily.    Marland Kitchen levothyroxine (SYNTHROID, LEVOTHROID) 112 MCG tablet Take 112 mcg by mouth daily before breakfast.     . metFORMIN (GLUCOPHAGE) 1000 MG tablet Take 1 tablet (1,000 mg total) by mouth 2 (two) times daily with a meal. (Patient taking differently: Take 1,000 mg daily by mouth. ) 180 tablet 3  . Multiple Vitamin (MULTIVITAMIN) capsule Take 1 capsule by mouth daily.    Glory Rosebush DELICA LANCETS 95J MISC USE ONE TIME A DAY 100 each 11  . ONETOUCH VERIO test strip USE TO CHECK ONE TIME A DAY 50 each 23  . polyvinyl  alcohol (LIQUIFILM TEARS) 1.4 % ophthalmic solution Place 1 drop into both eyes as needed for dry eyes.    . Probiotic Product (PROBIOTIC DAILY) CAPS Take 1 capsule by mouth every morning.     . triamterene-hydrochlorothiazide (DYAZIDE) 37.5-25 MG per capsule Take 1 capsule by mouth Daily.     . TURMERIC PO Take by mouth.     No current facility-administered medications on file prior to visit.    Allergies  Allergen Reactions  . Statins     Aches in her bones   Family History  Problem Relation Age of  Onset  . Migraines Neg Hx    PE: There were no vitals taken for this visit. There is no height or weight on file to calculate BMI. Wt Readings from Last 3 Encounters:  11/19/17 159 lb (72.1 kg)  08/06/17 160 lb 6 oz (72.7 kg)  05/21/17 156 lb (70.8 kg)   Constitutional: overweight, in NAD Eyes: PERRLA, EOMI, no exophthalmos ENT: moist mucous membranes, no thyromegaly, no cervical lymphadenopathy Cardiovascular: RRR, No MRG Respiratory: CTA B Gastrointestinal: abdomen soft, NT, ND, BS+ Musculoskeletal: no deformities, strength intact in all 4 Skin: moist, warm, no rashes Neurological: no tremor with outstretched hands, DTR normal in all 4   ASSESSMENT: 1. DM2, non-insulin-dependent, controlled, without long-term complications, but with occasional hyperglycemia  2. HL  PLAN:  1. Patient with history of controlled diabetes, on oral antidiabetic regimen with half maximal metformin dose.  She developed diarrhea and also dry mouth while on the higher dose of metformin of 1000 mg twice a day, so now continues this once a day.  She is asymptomatic on this dose. Sugars were close to goal at last visit, but she was only checking before dinner so I advised her to check at different times of the day, also.  At that time, an HbA1c was at goal, at 6.5%.  - I suggested to:  Patient Instructions  Please continue Metformin 1000 mg with dinner.  Please return in 4 months with your sugar log.    - today, HbA1c is 7%  - continue checking sugars at different times of the day - check 1x a day, rotating checks - advised for yearly eye exams >> she is UTD - Return to clinic in 4 mo with sugar log    2. HL -Reviewed latest lipid panel from 09/2016: Total cholesterol and triglycerides high, as is her LDL.   -She is intolerant to statins.  She tried Benecol spread but did not like it.  May benefit from PCSK9 inhibitors.   Philemon Kingdom, MD PhD [facility] Endocrinology

## 2018-01-25 ENCOUNTER — Encounter: Payer: Self-pay | Admitting: Internal Medicine

## 2018-01-25 ENCOUNTER — Ambulatory Visit: Payer: Medicare Other | Admitting: Internal Medicine

## 2018-01-25 VITALS — BP 140/72 | HR 64 | Ht 62.0 in | Wt 160.4 lb

## 2018-01-25 DIAGNOSIS — E785 Hyperlipidemia, unspecified: Secondary | ICD-10-CM | POA: Diagnosis not present

## 2018-01-25 DIAGNOSIS — E049 Nontoxic goiter, unspecified: Secondary | ICD-10-CM | POA: Diagnosis not present

## 2018-01-25 DIAGNOSIS — E119 Type 2 diabetes mellitus without complications: Secondary | ICD-10-CM | POA: Diagnosis not present

## 2018-01-25 DIAGNOSIS — E1169 Type 2 diabetes mellitus with other specified complication: Secondary | ICD-10-CM | POA: Diagnosis not present

## 2018-01-25 LAB — POCT GLYCOSYLATED HEMOGLOBIN (HGB A1C): HEMOGLOBIN A1C: 6.7

## 2018-01-25 NOTE — Patient Instructions (Signed)
Please continue metformin 1000 mg with dinner  Please return in 6 months with your sugar log.  

## 2018-01-25 NOTE — Progress Notes (Signed)
Patient ID: Robin [name], female   DOB: 1941/01/21, 77 y.o.   MRN: [id number]  HPI: Robin [name] is a 77 y.o.-year-old female, returning for f/u for DM2, initially GDM, then DM2 dx in 05/2015, insulin-independent, controlled, without long term complications. Last visit 6 months ago.  She is on a Prednisone taper for rash.  Sugars a little higher since last visit, but she only checked sparsely.  Last hemoglobin A1c: Lab Results  Component Value Date   HGBA1C 6.5% 08/06/2017   HGBA1C 6.6 04/04/2017   HGBA1C 6.1 11/14/2016   Pt is on a regimen of: - Metformin 1000 mg with dinner >> 1000 mg 2x a day >> now 1000 mg daily because of diarrhea  Pt checks her sugars 1x a day: - am: 114-120, 141, 156 >> 156 >> n/c >> 120-130 - 2h after b'fast: n/c - before lunch:n/c >> 92 >> n/c - 2h after lunch: 183-105, 154 >> n/c - before dinner:  70s, 110-120, 146 >> 94, 104 - 2h after dinner:  83 >> 120-130 >> n/c - bedtime: 115, 116 >> 94  >> n/c - nighttime: n/c Lowest sugar was 63 x1 >> 70s-80s >> 70s >> 70s; she has hypoglycemia awareness at 70.  Highest sugar was 156 >> 190s >> 146 >> 145.  Glucometer: One Touch  Pt's meals are: - Breakfast: cheerios + raisin bran or cornflakes + 2% milk or bacon + egg + toast - Lunch: salad or soup or sandwich - Dinner: meat + veggies + starch - Snacks: no desserts; apple, chips She saw a dietitian 07/09/2015 -this helped a lot.  -No CKD, last BUN/creatinine abnormal though:  Lab Results  Component Value Date   BUN 16 11/19/2017   CREATININE 1.11 (H) 11/19/2017   -+ HL; last set of lipids: Recent lipids: TG 600 >> started Fenofibrate >> TG 200s 10/10/2016: 246/381/40/130   Lab Results  Component Value Date   CHOL 208 (H) 11/01/2015   HDL 40 11/01/2015   LDLCALC 131 (H) 11/01/2015   TRIG 183 (H) 11/01/2015   CHOLHDL 5.2 (H) 11/01/2015  Intolerant to statins >> bone pain.  She tried Benechol spread but did not like it  - last eye exam was  in 10/2017: No DR - no numbness and tingling in her feet.  She also has hypertension , hyperlipidemia-intolerant to statins (atorvastatin, rosuvastatin), OSA, depression, osteoarthritis, spinal stenosis, osteopenia, GERD, CKD stage III. + h/o colon cancer.  ROS: Constitutional: no weight gain/no weight loss, no fatigue, no subjective hyperthermia, no subjective hypothermia Eyes: no blurry vision, no xerophthalmia ENT: no sore throat, no nodules palpated in throat, no dysphagia, no odynophagia, no hoarseness Cardiovascular: no CP/no SOB/no palpitations/no leg swelling Respiratory: no cough/no SOB/no wheezing Gastrointestinal: no N/no V/no D/no C/no acid reflux Musculoskeletal: no muscle aches/no joint aches Skin: no rashes, no hair loss Neurological: no tremors/no numbness/no tingling/no dizziness  I reviewed pt's medications, allergies, PMH, social hx, family hx, and changes were documented in the history of present illness. Otherwise, unchanged from my initial visit note.  Past Medical History:  Diagnosis Date  . Arthritis   . Colon cancer ([place])    colon ca dx 2008 and 2010  . Complication of anesthesia    problems waking up-patient thinks she was given too much medication  . Gestational diabetes   . Headache    headaches all the time  . Hypertension   . Sleep apnea    uses CPAP   Past Surgical History:  Procedure Laterality Date  . ABDOMINAL HYSTERECTOMY    . APPENDECTOMY    . BACK SURGERY  2009  . CARPAL TUNNEL RELEASE    . COLON SURGERY    . COLONOSCOPY WITH PROPOFOL N/A 08/10/2015   Procedure: COLONOSCOPY WITH PROPOFOL;  Surgeon: Garlan Fair, MD;  Location: [facility];  Service: Endoscopy;  Laterality: N/A;  . DILATION AND CURETTAGE OF UTERUS     had before had hysterectomy  . ESOPHAGOGASTRODUODENOSCOPY (EGD) WITH PROPOFOL N/A 08/10/2015   Procedure: ESOPHAGOGASTRODUODENOSCOPY (EGD) WITH PROPOFOL;  Surgeon: Garlan Fair, MD;  Location: [facility];   Service: Endoscopy;  Laterality: N/A;  . EYE SURGERY     bilateral cataract surgery   . ROTATOR CUFF REPAIR     Social History   Socioeconomic History  . Marital status: Divorced    Spouse name: Not on file  . Number of children: 4  . Years of education: 12+  . Highest education level: Not on file  Occupational History  . Occupation: [employer]  Social Needs  . Financial resource strain: Not on file  . Food insecurity:    Worry: Not on file    Inability: Not on file  . Transportation needs:    Medical: Not on file    Non-medical: Not on file  Tobacco Use  . Smoking status: Never Smoker  . Smokeless tobacco: Never Used  . Tobacco comment: never used tobacco  Substance and Sexual Activity  . Alcohol use: No    Alcohol/week: 0.0 oz  . Drug use: No  . Sexual activity: Not on file  Lifestyle  . Physical activity:    Days per week: Not on file    Minutes per session: Not on file  . Stress: Not on file  Relationships  . Social connections:    Talks on phone: Not on file    Gets together: Not on file    Attends religious service: Not on file    Active member of club or organization: Not on file    Attends meetings of clubs or organizations: Not on file    Relationship status: Not on file  . Intimate partner violence:    Fear of current or ex partner: Not on file    Emotionally abused: Not on file    Physically abused: Not on file    Forced sexual activity: Not on file  Other Topics Concern  . Not on file  Social History Narrative   Lives at home with herself.   Caffeine use: Drinks coffee and tea (1 cup coffee per day). Tea rarely    Current Outpatient Medications on File Prior to Visit  Medication Sig Dispense Refill  . amLODipine (NORVASC) 5 MG tablet Take 5 mg by mouth Daily.    . beta carotene w/minerals (OCUVITE) tablet Take 1 tablet by mouth daily.    . Cholecalciferol (VITAMIN D-3 PO) Take 2,000 Units by mouth daily.    . fenofibrate 54 MG tablet 54 mg  daily.    Marland Kitchen FLUoxetine (PROZAC) 20 MG capsule Take 20 mg by mouth Daily.    . fluticasone (FLONASE) 50 MCG/ACT nasal spray Place 1 spray into the nose 2 (two) times daily as needed for allergies or rhinitis.     Marland Kitchen GARLIC OIL PO Take by mouth.    Marland Kitchen GLUCOSAMINE HCL PO Take 1 tablet by mouth daily.    Marland Kitchen levothyroxine (SYNTHROID, LEVOTHROID) 112 MCG tablet Take 112 mcg by mouth daily before breakfast.     .  metFORMIN (GLUCOPHAGE) 1000 MG tablet Take 1 tablet (1,000 mg total) by mouth 2 (two) times daily with a meal. (Patient taking differently: Take 1,000 mg daily by mouth. ) 180 tablet 3  . Multiple Vitamin (MULTIVITAMIN) capsule Take 1 capsule by mouth daily.    Glory Rosebush DELICA LANCETS 13K MISC USE ONE TIME A DAY 100 each 11  . ONETOUCH VERIO test strip USE TO CHECK ONE TIME A DAY 50 each 23  . pantoprazole (PROTONIX) 40 MG tablet Take 40 mg by mouth daily.    . polyvinyl alcohol (LIQUIFILM TEARS) 1.4 % ophthalmic solution Place 1 drop into both eyes as needed for dry eyes.    . Probiotic Product (PROBIOTIC DAILY) CAPS Take 1 capsule by mouth every morning.     . triamterene-hydrochlorothiazide (DYAZIDE) 37.5-25 MG per capsule Take 1 capsule by mouth Daily.     . TURMERIC PO Take by mouth.     No current facility-administered medications on file prior to visit.    Allergies  Allergen Reactions  . Statins     Aches in her bones   Family History  Problem Relation Age of Onset  . Migraines Neg Hx    PE: BP 140/72 (BP Location: Left Arm, Patient Position: Sitting, Cuff Size: Normal)   Pulse 64   Ht 5\' 2"  (1.575 m)   Wt 160 lb 6.4 oz (72.8 kg)   SpO2 94%   BMI 29.34 kg/m  Body mass index is 29.34 kg/m. Wt Readings from Last 3 Encounters:  01/25/18 160 lb 6.4 oz (72.8 kg)  11/19/17 159 lb (72.1 kg)  08/06/17 160 lb 6 oz (72.7 kg)   Constitutional: overweight, in NAD Eyes: PERRLA, EOMI, no exophthalmos ENT: moist mucous membranes, +L>R thyromegaly, no cervical  lymphadenopathy Cardiovascular: RRR, No MRG Respiratory: CTA B Gastrointestinal: abdomen soft, NT, ND, BS+ Musculoskeletal:  + deformities ([place], Bouchard nodules), strength intact in all 4 Skin: moist, warm, no rashes Neurological: no tremor with outstretched hands, DTR normal in all 4   ASSESSMENT: 1. DM2, non-insulin-dependent, controlled, without long-term complications  2. HL  3.  Enlarged thyroid - L  PLAN:  1. Patient with history of controlled diabetes, on oral antidiabetic regimen, with metformin only.  We cannot use the maximal dose of metformin due to diarrhea, however, she tolerates well 1000 mg daily. - at last visit, HbA1c was at goal, 6.5% - Sugars continue to stay close to goal but she only checked few times, and mostly before dinner.  Again advised her to try to check sugars at different times of the day. - No other changes are needed for now. - I suggested to:  Patient Instructions  Please continue metformin 1000 mg with dinner  Please return in 6 months with your sugar log.   - today, HbA1c is 6.7% (higher) - continue checking sugars at different times of the day - check 1x a day, rotating checks - advised for yearly eye exams >> she is UTD - Return to clinic in 6 mo with sugar log     2. HL - Reviewed latest lipid panel from 09/2016: Total cholesterol level and triglycerides worse - Intolerant to statins.  - started fenofibrate >> tolerates this well   3.  Enlarged thyroid - L - Left thyroid lobe is more prominent on palpation than the right - No neck compression symptoms - We may need a thyroid ultrasound at next visit  Philemon Kingdom, MD PhD [facility] Endocrinology

## 2018-03-22 ENCOUNTER — Encounter: Payer: Self-pay | Admitting: Allergy

## 2018-03-22 ENCOUNTER — Ambulatory Visit: Payer: Medicare Other | Admitting: Allergy

## 2018-03-22 VITALS — BP 142/60 | HR 67 | Temp 98.2°F | Resp 17 | Ht 61.0 in | Wt 157.4 lb

## 2018-03-22 DIAGNOSIS — T783XXD Angioneurotic edema, subsequent encounter: Secondary | ICD-10-CM

## 2018-03-22 DIAGNOSIS — E079 Disorder of thyroid, unspecified: Secondary | ICD-10-CM | POA: Diagnosis not present

## 2018-03-22 DIAGNOSIS — L508 Other urticaria: Secondary | ICD-10-CM | POA: Diagnosis not present

## 2018-03-22 MED ORDER — FEXOFENADINE HCL 180 MG PO TABS: 180.0000 mg | ORAL_TABLET | Freq: Two times a day (BID) | ORAL | 2 refills | Status: DC

## 2018-03-22 MED ORDER — MONTELUKAST SODIUM 10 MG PO TABS: 10.0000 mg | ORAL_TABLET | Freq: Every day | ORAL | 3 refills | Status: DC

## 2018-03-22 MED ORDER — RANITIDINE HCL 150 MG PO TABS: 150.0000 mg | ORAL_TABLET | Freq: Two times a day (BID) | ORAL | 2 refills | Status: DC

## 2018-03-22 NOTE — Patient Instructions (Addendum)
Chronic hives with swelling  - at this time etiology of hives and swelling is unknown.  Hives can be caused by a variety of different triggers including illness/infection, foods, medications, stings, exercise, pressure, vibrations, extremes of temperature to name a few however majority of the time there is no identifiable trigger.  Your symptoms have been ongoing for >6 weeks making this chronic thus will obtain labwork to evaluate: CBC w diff, CMP, tryptase, hive panel and thyroid studies, environmental panel, milk IgE   - alpha gal panel previously done is negative thus do not have red meat allergy    - for management of hives take following: singulair 10mg  daily, allegra 180mg  twice day and zantac 150mg  twice a day    - we did discuss Xolair monthly injections for management of hives if above regimen is not effective enough.      - let us know if hives start to leave bruising, you are having fevers or any joint aches/pains with hives.    Follow-up 2-3 months or sooner if needed

## 2018-03-22 NOTE — Progress Notes (Signed)
New Patient Note  RE: Robin [name] MRN: [id number] DOB: [birth year] Date of Office Visit: 03/22/2018  Referring provider: Dineen Kid, MD Primary care provider: Leighton Ruff, MD  Chief Complaint: hives  History of present illness: Robin [name] is a 77 y.o. female presenting today for consultation for urticaria.    She has been having hives that she reports pops up every day now for the past 10 weeks.  She has never had hives before.   She states she worked out in the yard the week before the hives started.  She states her hands started itching and feet after gardening.  The following days she noted hives.  Hives can appear all over her body.  She has had swelling of her lips as well.  Hives are lasting less than 24 hours.  No bruising/marks left behind.  No fever.  No joint aches pains.  No preceding illnesses, no new foods, no stings, no change in soaps/lotions/detergents.   She states she started fenofibrate at sometime prior to hives starting but does not recall when she started this medication.   She has been using Free and clear detergent as she thought maybe her old detergent was the cause but she is still having hives on this detergent as well.   She states she slept on sofa for 2 weeks as thought it was something with her bed/bedding but again still had hive development.  She states she stopped eating meat (excpet for fish and chicken).  She quit drinking milk and butter as she had cereal one night and hives popped up next day.  But without dairy in the diet she is still having hives.    Her PCP started her on singulair as well as claritin twice a day.  She has not noted any significant difference in hives.   She has tried using cortisone cream which didn't help.  She also has tried benadryl cream.  She has been on prednisone for her hives which she states also didn't help.   She did have an alpha gal panel done at her PCP which is negative.   She does have thyroid  disease however does not know she she has hyper- or hypothyrodism.    No history of asthma or eczema or food allergy.  She has used flonase for management of nasal congestion.  She has never had any allergy testing.    Review of systems: Review of Systems  Constitutional: Negative for chills, fever and malaise/fatigue.  HENT: Negative for congestion, ear discharge, nosebleeds, sinus pain and sore throat.   Eyes: Negative for pain, discharge and redness.  Respiratory: Negative for cough, shortness of breath and wheezing.   Cardiovascular: Negative for chest pain.  Gastrointestinal: Negative for abdominal pain, constipation, diarrhea, heartburn, nausea and vomiting.  Musculoskeletal: Negative for joint pain.  Skin: Positive for itching and rash.  Neurological: Negative for headaches.    All other systems negative unless noted above in HPI  Past medical history: Past Medical History:  Diagnosis Date  . Arthritis   . Colon cancer ([place])    colon ca dx 2008 and 2010  . Complication of anesthesia    problems waking up-patient thinks she was given too much medication  . Gestational diabetes   . Headache    headaches all the time  . Hypertension   . Sleep apnea    uses CPAP  . Urticaria     Past surgical history: Past Surgical History:  Procedure Laterality  Date  . ABDOMINAL HYSTERECTOMY    . APPENDECTOMY    . BACK SURGERY  2009  . CARPAL TUNNEL RELEASE    . COLON SURGERY    . COLONOSCOPY WITH PROPOFOL N/A 08/10/2015   Procedure: COLONOSCOPY WITH PROPOFOL;  Surgeon: Garlan Fair, MD;  Location: [facility];  Service: Endoscopy;  Laterality: N/A;  . DILATION AND CURETTAGE OF UTERUS     had before had hysterectomy  . ESOPHAGOGASTRODUODENOSCOPY (EGD) WITH PROPOFOL N/A 08/10/2015   Procedure: ESOPHAGOGASTRODUODENOSCOPY (EGD) WITH PROPOFOL;  Surgeon: Garlan Fair, MD;  Location: [facility];  Service: Endoscopy;  Laterality: N/A;  . EYE SURGERY     bilateral cataract  surgery   . ROTATOR CUFF REPAIR      Family history:  Family History  Problem Relation Age of Onset  . Migraines Neg Hx     Social history: Lives in a townhouse with carpeting with electric heating and central cooling.  No pets in the home but stray cats and dogs outside the home.  No concern fo rwater damage, mildew or roaches in the home.  She is an Web designer.  Denies smoking history.    Medication List: Allergies as of 03/22/2018      Reactions   Statins    Aches in her bones      Medication List        Accurate as of 03/22/18 12:20 PM. Always use your most recent med list.          amLODipine 5 MG tablet Commonly known as:  NORVASC Take 5 mg by mouth Daily.   beta carotene w/minerals tablet Take 1 tablet by mouth daily.   fenofibrate 54 MG tablet 54 mg daily.   FLUoxetine 20 MG capsule Commonly known as:  PROZAC Take 20 mg by mouth Daily.   fluticasone 50 MCG/ACT nasal spray Commonly known as:  FLONASE Place 1 spray into the nose 2 (two) times daily as needed for allergies or rhinitis.   GARLIC OIL PO Take by mouth.   GLUCOSAMINE HCL PO Take 1 tablet by mouth daily.   levothyroxine 112 MCG tablet Commonly known as:  SYNTHROID, LEVOTHROID Take 112 mcg by mouth daily before breakfast.   metFORMIN 1000 MG tablet Commonly known as:  GLUCOPHAGE Take 1 tablet (1,000 mg total) by mouth 2 (two) times daily with a meal.   multivitamin capsule Take 1 capsule by mouth daily.   ONETOUCH DELICA LANCETS 72C Misc USE ONE TIME A DAY   ONETOUCH VERIO test strip Generic drug:  glucose blood USE TO CHECK ONE TIME A DAY   pantoprazole 40 MG tablet Commonly known as:  PROTONIX Take 40 mg by mouth daily.   polyvinyl alcohol 1.4 % ophthalmic solution Commonly known as:  LIQUIFILM TEARS Place 1 drop into both eyes as needed for dry eyes.   PROBIOTIC DAILY Caps Take 1 capsule by mouth every morning.   triamterene-hydrochlorothiazide 37.5-25 MG  capsule Commonly known as:  DYAZIDE Take 1 capsule by mouth Daily.   TURMERIC PO Take by mouth.   VITAMIN D-3 PO Take 2,000 Units by mouth daily.       Known medication allergies: Allergies  Allergen Reactions  . Statins     Aches in her bones     Physical examination: Blood pressure (!) 142/60, pulse 67, temperature 98.2 F (36.8 C), temperature source Oral, resp. rate 17, height 5\' 1"  (1.549 m), weight 157 lb 6.4 oz (71.4 kg), SpO2 95 %.  General:  Alert, interactive, in no acute distress. HEENT: PERRLA, TMs pearly gray, turbinates minimally edematous without discharge, post-pharynx non erythematous. Neck: Supple without lymphadenopathy. Lungs: Clear to auscultation without wheezing, rhonchi or rales. {no increased work of breathing. CV: Normal S1, S2 without murmurs. Abdomen: Nondistended, nontender. Skin: Scattered erythematous urticarial type lesions primarily located left inner upper arm, right shin, midback , nonvesicular. Extremities:  No clubbing, cyanosis or edema. Neuro:   Grossly intact.  Diagnositics/Labs: Labs: alpha gal panel from 02/27/18 shows <0.10 to beef, lamb, pork and alpha gal.   Allergy testing: deferred due to ongoing urticaria  Assessment and plan:   Chronic urticaria with angioedema  - at this time etiology of hives and swelling is unknown.  Hives can be caused by a variety of different triggers including illness/infection, foods, medications, stings, exercise, pressure, vibrations, extremes of temperature to name a few however majority of the time there is no identifiable trigger.  She is at risk for autoimmune hives given history of thyroid disease.  Your symptoms have been ongoing for >6 weeks making this chronic thus will obtain labwork to evaluate: CBC w diff, CMP, tryptase, hive panel and thyroid studies, environmental panel, milk IgE   - alpha gal panel previously done is negative thus do not have red meat allergy    - for management of  hives take following: singulair 10mg  daily, allegra 180mg  twice day and zantac 150mg  twice a day    - we did discuss Xolair monthly injections for management of hives if above regimen is not effective enough.      - let us know if hives start to leave bruising, you are having fevers or any joint aches/pains with hives.    Follow-up 2-3 months or sooner if needed  I appreciate the opportunity to take part in Hansika's care. Please do not hesitate to contact me with questions.  Sincerely,   Prudy Feeler, MD Allergy/Immunology [facility]

## 2018-04-01 ENCOUNTER — Telehealth: Payer: Self-pay

## 2018-04-01 ENCOUNTER — Other Ambulatory Visit: Payer: Self-pay

## 2018-04-01 DIAGNOSIS — L508 Other urticaria: Secondary | ICD-10-CM

## 2018-04-01 MED ORDER — FEXOFENADINE HCL 180 MG PO TABS: 180.0000 mg | ORAL_TABLET | Freq: Two times a day (BID) | ORAL | 3 refills | Status: DC

## 2018-04-02 NOTE — Telephone Encounter (Signed)
Labs ordered and given to lab tech for draw in the future.

## 2018-04-03 ENCOUNTER — Encounter: Payer: Self-pay | Admitting: Allergy

## 2018-04-05 LAB — COMPREHENSIVE METABOLIC PANEL
ALT: 48 IU/L — AB (ref 0–32)
AST: 37 IU/L (ref 0–40)
Albumin/Globulin Ratio: 2 (ref 1.2–2.2)
Albumin: 4.6 g/dL (ref 3.5–4.8)
Alkaline Phosphatase: 50 IU/L (ref 39–117)
BUN/Creatinine Ratio: 14 (ref 12–28)
BUN: 13 mg/dL (ref 8–27)
Bilirubin Total: 0.3 mg/dL (ref 0.0–1.2)
CALCIUM: 10.6 mg/dL — AB (ref 8.7–10.3)
CO2: 23 mmol/L (ref 20–29)
CREATININE: 0.93 mg/dL (ref 0.57–1.00)
Chloride: 105 mmol/L (ref 96–106)
GFR calc Af Amer: 69 mL/min/{1.73_m2} (ref >59)
GFR calc non Af Amer: 59 mL/min/{1.73_m2} — ABNORMAL LOW (ref >59)
Globulin, Total: 2.3 g/dL (ref 1.5–4.5)
Glucose: 129 mg/dL — ABNORMAL HIGH (ref 65–99)
Potassium: 4.1 mmol/L (ref 3.5–5.2)
Sodium: 142 mmol/L (ref 134–144)
Total Protein: 6.9 g/dL (ref 6.0–8.5)

## 2018-04-05 LAB — ALLERGENS, ZONE 2
Alternaria Alternata IgE: <0.1 kU/L
Amer Sycamore IgE Qn: <0.1 kU/L
Aspergillus Fumigatus IgE: <0.1 kU/L
Bahia Grass IgE: <0.1 kU/L
Bermuda Grass IgE: <0.1 kU/L
Cat Dander IgE: <0.1 kU/L
Cedar, Mountain IgE: <0.1 kU/L
Common Silver Birch IgE: <0.1 kU/L
Dog Dander IgE: <0.1 kU/L
Johnson Grass IgE: <0.1 kU/L
Mucor Racemosus IgE: <0.1 kU/L
Mugwort IgE Qn: <0.1 kU/L
Ragweed, Short IgE: <0.1 kU/L
Stemphylium Herbarum IgE: <0.1 kU/L
Sweet gum IgE RAST Ql: <0.1 kU/L
Timothy Grass IgE: <0.1 kU/L
White Mulberry IgE: <0.1 kU/L

## 2018-04-05 LAB — CBC WITH DIFFERENTIAL
BASOS: 0 %
Basophils Absolute: 0 10*3/uL (ref 0.0–0.2)
EOS (ABSOLUTE): 0.2 10*3/uL (ref 0.0–0.4)
EOS: 4 %
HEMOGLOBIN: 12.3 g/dL (ref 11.1–15.9)
Hematocrit: 37.1 % (ref 34.0–46.6)
IMMATURE GRANS (ABS): 0 10*3/uL (ref 0.0–0.1)
Immature Granulocytes: 0 %
LYMPHS: 34 %
Lymphocytes Absolute: 1.9 10*3/uL (ref 0.7–3.1)
MCH: 29.9 pg (ref 26.6–33.0)
MCHC: 33.2 g/dL (ref 31.5–35.7)
MCV: 90 fL (ref 79–97)
MONOCYTES: 9 %
Monocytes Absolute: 0.5 10*3/uL (ref 0.1–0.9)
NEUTROS PCT: 53 %
Neutrophils Absolute: 3 10*3/uL (ref 1.4–7.0)
RBC: 4.11 x10E6/uL (ref 3.77–5.28)
RDW: 13.4 % (ref 12.3–15.4)
WBC: 5.7 10*3/uL (ref 3.4–10.8)

## 2018-04-05 LAB — THYROID ANTIBODIES
THYROGLOBULIN ANTIBODY: 93.8 [IU]/mL — AB (ref 0.0–0.9)
Thyroperoxidase Ab SerPl-aCnc: 16 IU/mL (ref 0–34)

## 2018-04-05 LAB — CHRONIC URTICARIA: cu index: 27.6 — ABNORMAL HIGH (ref <10)

## 2018-04-05 LAB — TSH: TSH: 2.14 u[IU]/mL (ref 0.450–4.500)

## 2018-04-05 LAB — TRYPTASE: TRYPTASE: 27.2 ug/L — AB (ref 2.2–13.2)

## 2018-04-05 LAB — ALLERGEN MILK: Milk IgE: <0.1 kU/L

## 2018-04-07 ENCOUNTER — Emergency Department (HOSPITAL_COMMUNITY)
Admission: EM | Admit: 2018-04-07 | Discharge: 2018-04-07 | Disposition: A | Discharge status: Home / Self Care | Payer: Medicare Other | Attending: Emergency Medicine | Admitting: Emergency Medicine

## 2018-04-07 ENCOUNTER — Encounter (HOSPITAL_COMMUNITY): Payer: Self-pay | Admitting: Emergency Medicine

## 2018-04-07 DIAGNOSIS — E119 Type 2 diabetes mellitus without complications: Secondary | ICD-10-CM | POA: Diagnosis not present

## 2018-04-07 DIAGNOSIS — Z7984 Long term (current) use of oral hypoglycemic drugs: Secondary | ICD-10-CM | POA: Diagnosis not present

## 2018-04-07 DIAGNOSIS — R600 Localized edema: Secondary | ICD-10-CM | POA: Diagnosis present

## 2018-04-07 DIAGNOSIS — Z79899 Other long term (current) drug therapy: Secondary | ICD-10-CM | POA: Diagnosis not present

## 2018-04-07 DIAGNOSIS — E785 Hyperlipidemia, unspecified: Secondary | ICD-10-CM | POA: Diagnosis not present

## 2018-04-07 DIAGNOSIS — R220 Localized swelling, mass and lump, head: Secondary | ICD-10-CM

## 2018-04-07 DIAGNOSIS — I10 Essential (primary) hypertension: Secondary | ICD-10-CM | POA: Diagnosis not present

## 2018-04-07 MED ORDER — PREDNISONE 20 MG PO TABS
60.0000 mg | ORAL_TABLET | Freq: Once | ORAL | Status: AC
  Administered 2018-04-07: 60 mg via ORAL
  Filled 2018-04-07: qty 3

## 2018-04-07 MED ORDER — PREDNISONE 10 MG (21) PO TBPK: ORAL_TABLET | Freq: Every day | ORAL | 0 refills | Status: DC

## 2018-04-07 NOTE — Discharge Instructions (Addendum)
You were seen in the emergency department today for swelling to your lip.  We are starting you on a prednisone pack, please take this as prescribed.  As discussed this may elevate your blood sugar, be sure to monitor this closely with your history of diabetes.  We would like you to follow-up with your allergist within the next 3-5 days for re-evaluation and to discuss your results and possible future treatment options.   Return to the ER anytime for new or worsening symptoms including but not limited to trouble breathing, feeling like your throat is closing, difficulty swallowing, sore throat, or any other concerns that you may have.  Additionally your blood pressure was elevated in the emergency department today, this is likely because you do not take your blood pressure medication this morning, be sure to take your blood pressure medications as prescribed and have your primary care provider recheck this within 1-2 weeks.

## 2018-04-07 NOTE — ED Provider Notes (Signed)
[facility] DEPT Provider Note   CSN: [id number] Arrival date & time: 04/07/18  1010     History   Chief Complaint Chief Complaint  Patient presents with  . Oral Swelling  . Headache    HPI Robin [name] is a 77 y.o. female with a hx of sleep apnea on CPAP, HTN, T2DM, and colon cancer who presents to the ED with multiple complaints today.   Patient states she started having lip swelling last night, continued this AM. Reports associated itching sensation to the lip/jaw area. Patient states that she believes she currently has some urticaria to her back. She has no specific trigger to these sxs. No specific alleviating/aggravting factors. She has had problems with waxing/waning urticaria and episodic oral swelling x 11 weeks. The oral swelling has only occurred once previously- she was given prednisone for this. She was seen by [facility] 03/22/09 for same- she is currently taking singular daily, allegra BID, and Zantac BID- states she has taken these as prescribed. She has also taken Hydroxyzine for itching. She had labs drawn by allergist but does not know these results, plan for follow up in a few weeks. Denies difficulty breathing, feeling of throat closing, sore throat, or trouble swallowing. Patient also mentions gradual onset steady progression headache this AM, similar to previous this AM, she reports this is not her main concern. She has not taken her antihypertensive medications as she forgot. Denies change in vision, numbness, weakness, dizziness, chest pain, or dyspnea.     HPI  Past Medical History:  Diagnosis Date  . Arthritis   . Colon cancer ([place])    colon ca dx 2008 and 2010  . Complication of anesthesia    problems waking up-patient thinks she was given too much medication  . Gestational diabetes   . Headache    headaches all the time  . Hypertension   . Sleep apnea    uses CPAP  . Urticaria     Patient  Active Problem List   Diagnosis Date Noted  . Enlarged thyroid 01/25/2018  . Malignant neoplasm of transverse colon ([place]) 11/20/2016  . Hyperlipidemia due to type 2 diabetes mellitus ([place]) 12/07/2015  . Controlled type 2 diabetes mellitus without complication, without long-term current use of insulin ([place]) 12/07/2015  . Musculoskeletal neck pain 03/23/2015  . Cervical disc disorder with radiculopathy of cervical region 03/23/2015  . Arm weakness 03/23/2015  . Decreased ROM of neck 03/23/2015  . Crepitus of cervical spine 03/23/2015  . New onset of headaches after age 35 03/23/2015  . Colon cancer ([place]) 10/26/2011    Past Surgical History:  Procedure Laterality Date  . ABDOMINAL HYSTERECTOMY    . APPENDECTOMY    . BACK SURGERY  2009  . CARPAL TUNNEL RELEASE    . COLON SURGERY    . COLONOSCOPY WITH PROPOFOL N/A 08/10/2015   Procedure: COLONOSCOPY WITH PROPOFOL;  Surgeon: Garlan Fair, MD;  Location: [facility];  Service: Endoscopy;  Laterality: N/A;  . DILATION AND CURETTAGE OF UTERUS     had before had hysterectomy  . ESOPHAGOGASTRODUODENOSCOPY (EGD) WITH PROPOFOL N/A 08/10/2015   Procedure: ESOPHAGOGASTRODUODENOSCOPY (EGD) WITH PROPOFOL;  Surgeon: Garlan Fair, MD;  Location: [facility];  Service: Endoscopy;  Laterality: N/A;  . EYE SURGERY     bilateral cataract surgery   . ROTATOR CUFF REPAIR       OB History   None      Home Medications  Prior to Admission medications   Medication Sig Start Date End Date Taking? Authorizing Provider  amLODipine (NORVASC) 5 MG tablet Take 5 mg by mouth Daily. 07/28/11   [provider]  beta carotene w/minerals (OCUVITE) tablet Take 1 tablet by mouth daily.    [provider]  Cholecalciferol (VITAMIN D-3 PO) Take 2,000 Units by mouth daily.    [provider]  fenofibrate 54 MG tablet 54 mg daily. 11/13/17   [provider]  fexofenadine (ALLEGRA) 180 MG tablet Take 1 tablet (180 mg  total) by mouth 2 (two) times daily. 04/01/18   Kennith Gain, MD  FLUoxetine (PROZAC) 20 MG capsule Take 20 mg by mouth Daily. 07/27/11   [provider]  fluticasone (FLONASE) 50 MCG/ACT nasal spray Place 1 spray into the nose 2 (two) times daily as needed for allergies or rhinitis.  01/23/13   [provider]  GARLIC OIL PO Take by mouth.    [provider]  GLUCOSAMINE HCL PO Take 1 tablet by mouth daily.    [provider]  levothyroxine (SYNTHROID, LEVOTHROID) 112 MCG tablet Take 112 mcg by mouth daily before breakfast.  06/24/15   [provider]  metFORMIN (GLUCOPHAGE) 1000 MG tablet Take 1 tablet (1,000 mg total) by mouth 2 (two) times daily with a meal. Patient taking differently: Take 1,000 mg daily by mouth.  04/04/17   Philemon Kingdom, MD  montelukast (SINGULAIR) 10 MG tablet Take 1 tablet (10 mg total) by mouth at bedtime. 03/22/18   Kennith Gain, MD  Multiple Vitamin (MULTIVITAMIN) capsule Take 1 capsule by mouth daily.    [provider]  Jonetta Speak LANCETS 16X MISC USE ONE TIME A DAY 03/15/17   Philemon Kingdom, MD  [facility] VERIO test strip USE TO CHECK ONE TIME A DAY 03/15/17   Philemon Kingdom, MD  pantoprazole (PROTONIX) 40 MG tablet Take 40 mg by mouth daily.    [provider]  polyvinyl alcohol (LIQUIFILM TEARS) 1.4 % ophthalmic solution Place 1 drop into both eyes as needed for dry eyes.    [provider]  Probiotic Product (PROBIOTIC DAILY) CAPS Take 1 capsule by mouth every morning.     [provider]  ranitidine (ZANTAC) 150 MG tablet Take 1 tablet (150 mg total) by mouth 2 (two) times daily. 03/22/18   Kennith Gain, MD  triamterene-hydrochlorothiazide (DYAZIDE) 37.5-25 MG per capsule Take 1 capsule by mouth Daily.  07/27/11   [provider]  TURMERIC PO Take by mouth.    [provider]    Family History Family History  Problem  Relation Age of Onset  . Migraines Neg Hx     Social History Social History   Tobacco Use  . Smoking status: Never Smoker  . Smokeless tobacco: Never Used  . Tobacco comment: never used tobacco  Substance Use Topics  . Alcohol use: No    Alcohol/week: 0.0 oz  . Drug use: No     Allergies   Statins   Review of Systems Review of Systems  Constitutional: Negative for chills and fever.  HENT: Positive for facial swelling. Negative for drooling, ear pain, sore throat and trouble swallowing.   Eyes: Negative for visual disturbance.  Respiratory: Negative for shortness of breath.   Cardiovascular: Negative for chest pain.  Gastrointestinal: Negative for blood in stool, diarrhea, nausea and vomiting.  Skin: Positive for rash.  Neurological: Positive for headaches. Negative for dizziness, facial asymmetry, weakness, light-headedness and numbness.  All other systems reviewed and are negative.    Physical Exam Updated Vital Signs BP (!) 170/64 (BP Location: Left Arm)   Pulse 61   Temp 98.3 F (36.8 C) (Oral)   Resp 16   Ht 5\' 2"  (1.575 m)   Wt 70.3 kg (155 lb)   SpO2 98%   BMI 28.35 kg/m   Physical Exam  Constitutional: She is oriented to person, place, and time. She appears well-developed and well-nourished.  Non-toxic appearance. No distress.  HENT:  Head: Normocephalic and atraumatic.  Right Ear: Tympanic membrane normal.  Left Ear: Tympanic membrane normal.  Nose: Nose normal.  Mouth/Throat: Uvula is midline and oropharynx is clear and moist. No uvula swelling.  Possible lower lip swelling that is very mild. Patient is tolerating her own secretions without difficulty. No trismus. No drooling. No hot potato voice. Airway is patent. No stridor.   Eyes: Pupils are equal, round, and reactive to light. Conjunctivae and EOM are normal. Right eye exhibits no discharge. Left eye exhibits no discharge.  Neck: Normal range of motion. Neck supple. Carotid bruit is not  present.  NO appreciable swelling of the neck  Cardiovascular: Normal rate and regular rhythm.  No murmur heard. Pulmonary/Chest: Breath sounds normal. No respiratory distress. She has no wheezes. She has no rales.  Abdominal: Soft. She exhibits no distension. There is no tenderness.  Lymphadenopathy:    She has no cervical adenopathy.  Neurological: She is alert and oriented to person, place, and time.  Clear speech. CN III-XII grossly intact. Sensation grossly intact to bilateral upper/lower extremities. 5/5 symmetric grip strength. 5/5 strength with plantar/dorsiflexion bilaterally. Negative pronator drift. Gait intact.   Skin: Skin is warm and dry. Rash noted. Rash is urticarial (there are 4 1-2cm diameter urticarial type areas to patient's trunk, none to face. ).  Psychiatric: She has a normal mood and affect. Her behavior is normal.  Nursing note and vitals reviewed.    ED Treatments / Results  Labs (all labs ordered are listed, but only abnormal results are displayed) Labs Reviewed - No data to display  EKG None  Radiology No results found.  Procedures Procedures (including critical care time)  Medications Ordered in ED Medications  predniSONE (DELTASONE) tablet 60 mg (60 mg Oral Given 04/07/18 1351)     Initial Impression / Assessment and Plan / ED Course  I have reviewed the triage vital signs and the nursing notes.  Pertinent labs & imaging results that were available during my care of the patient were reviewed by me and considered in my medical decision making (see chart for details).   Patient presents with acute on chronic urticaria and lip swelling. Patient nontoxic appearing, in no apparent distress, vitals WNL other than elevated blood pressure, doubt HTN emergency, patient has not taken her anti-hypertensive medications today- discussed need for compliance and recheck. Feel elevated BP could be contributory to her headache, she has no focal deficits on exam,  gradual onset/steady progression, similar to previous,  doubt SAH, ICH, or ischemic CVA. On exam patient has very mild possible lower lip swelling, a few urticarial areas to her back. Patient denies any difficulty breathing or swallowing.  She has a patent airway without stridor and is handling secretions without difficulty. This problem has been occurring for about 11 weeks waxing/waning, being followed by allergist- labs drawn appear somewhat consistent with an autoimmune type urticarial process. Discussed all findings and plan of care with supervising physician Dr. Ashok Cordia who personally evaluated this  patient- recommends prednisone taper with allergist follow up. Plan carried out as above. I discussed treatment plan, need for allergy and PCP follow-up, and return precautions with the patient. Provided opportunity for questions, patient confirmed understanding and is in agreement with plan.   Final Clinical Impressions(s) / ED Diagnoses   Final diagnoses:  Lip swelling    ED Discharge Orders        Ordered    predniSONE (STERAPRED UNI-PAK 21 TAB) 10 MG (21) TBPK tablet  Daily     04/07/18 1353       Anja Neuzil, [place] R, PA-C 04/07/18 1514    Lajean Saver, MD 04/08/18 1221

## 2018-04-07 NOTE — ED Triage Notes (Signed)
Patient here from home with complaints of oral swelling and lip swelling x 11 weeks. Headache today. Stats that she has seen 4 Drs and 1 Allergist. No relief. Denies difficulty breathing or swallowing.

## 2018-04-07 NOTE — ED Notes (Signed)
Pt reports she did not take her BP meds this AM.

## 2018-04-08 ENCOUNTER — Telehealth: Payer: Self-pay | Admitting: Allergy

## 2018-04-08 NOTE — Telephone Encounter (Signed)
Patient went into work and for got her cell phone. Please contact her  At [phone] or [phone]

## 2018-04-08 NOTE — Telephone Encounter (Signed)
Patient is calling Stated she spent yesterday in the ER due to mouth swelling Wanted to know if anything came of her testing when she was last seen Or what she can do to prevent this in the future

## 2018-04-08 NOTE — Telephone Encounter (Signed)
Lab results show environmental allergy panel is negative. Please advise.

## 2018-04-09 NOTE — Telephone Encounter (Signed)
Patient called back and said she still needs to discuss one of the tests and she has other things she needs to discuss. She has given two numbers: [phone] or if after 12:00 work # [phone].

## 2018-04-09 NOTE — Telephone Encounter (Signed)
I called her last week and discussed her results with her (see result note).   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   I discussed option of xolair with her at last visit in medications were not effective.  I think she should consider starting on this therapy at this time.   In the interim she can can add additional antihistamine to her regimen.  She should already be taking Allegra bid would add either zyrtec or xyzal in 1-2 times a days as well.    If we have any xolair for hives brochures we can mail that to her.   If she would like for [place] to go ahead and start the xolair approval process please let us know.

## 2018-04-09 NOTE — Telephone Encounter (Signed)
Pt needs to schedule a 45 appt

## 2018-04-15 ENCOUNTER — Telehealth: Payer: Self-pay | Admitting: Allergy

## 2018-04-15 NOTE — Telephone Encounter (Signed)
Patient would like test results. If you call in the afternoon, call # [phone].

## 2018-04-15 NOTE — Telephone Encounter (Signed)
Tried calling pt but couldn't leave a message

## 2018-04-16 ENCOUNTER — Telehealth: Payer: Self-pay

## 2018-04-16 NOTE — Telephone Encounter (Signed)
Routed Tryptase results to Dr. Eudelia Bunch, [place].

## 2018-04-18 ENCOUNTER — Telehealth: Payer: Self-pay | Admitting: Allergy

## 2018-04-18 NOTE — Telephone Encounter (Signed)
Labs sent to pcp and placed pts copy of labs in mailbox to be mailed to her tomorrow

## 2018-04-18 NOTE — Telephone Encounter (Signed)
Pt called and would like for Dr. Nelva [place] to call her about her labs and also wanted to know why she has not receive copys of labs and notes from dr padgett. Also she wanted labs sent to Dr. Rosalie Gums but they have not receive them.[phone].

## 2018-04-18 NOTE — Telephone Encounter (Signed)
Please mail copy of labs to pt and forward to her PCP as a well.

## 2018-05-08 ENCOUNTER — Other Ambulatory Visit: Payer: Self-pay | Admitting: Gastroenterology

## 2018-05-08 DIAGNOSIS — R1013 Epigastric pain: Secondary | ICD-10-CM

## 2018-05-14 ENCOUNTER — Ambulatory Visit
Admission: RE | Admit: 2018-05-14 | Discharge: 2018-05-14 | Disposition: A | Discharge status: Home / Self Care | Payer: Medicare Other | Source: Ambulatory Visit | Attending: Gastroenterology | Admitting: Gastroenterology

## 2018-05-14 DIAGNOSIS — R1013 Epigastric pain: Secondary | ICD-10-CM

## 2018-05-14 MED ORDER — IOPAMIDOL (ISOVUE-300) INJECTION 61%
100.0000 mL | Freq: Once | INTRAVENOUS | Status: AC | PRN
  Administered 2018-05-14: 100 mL via INTRAVENOUS

## 2018-05-20 ENCOUNTER — Other Ambulatory Visit: Payer: Self-pay

## 2018-05-20 ENCOUNTER — Inpatient Hospital Stay (HOSPITAL_BASED_OUTPATIENT_CLINIC_OR_DEPARTMENT_OTHER): Payer: Medicare Other | Admitting: Hematology & Oncology

## 2018-05-20 ENCOUNTER — Inpatient Hospital Stay: Payer: Medicare Other | Attending: Hematology & Oncology

## 2018-05-20 ENCOUNTER — Encounter: Payer: Self-pay | Admitting: Hematology & Oncology

## 2018-05-20 VITALS — BP 161/65 | HR 63 | Temp 98.2°F | Resp 18 | Wt 153.0 lb

## 2018-05-20 DIAGNOSIS — L509 Urticaria, unspecified: Secondary | ICD-10-CM | POA: Insufficient documentation

## 2018-05-20 DIAGNOSIS — C182 Malignant neoplasm of ascending colon: Secondary | ICD-10-CM

## 2018-05-20 DIAGNOSIS — Z79899 Other long term (current) drug therapy: Secondary | ICD-10-CM | POA: Insufficient documentation

## 2018-05-20 DIAGNOSIS — Z85038 Personal history of other malignant neoplasm of large intestine: Secondary | ICD-10-CM

## 2018-05-20 DIAGNOSIS — C184 Malignant neoplasm of transverse colon: Secondary | ICD-10-CM

## 2018-05-20 LAB — CMP (CANCER CENTER ONLY)
ALT: 40 U/L (ref 10–47)
ANION GAP: 5 (ref 5–15)
AST: 32 U/L (ref 11–38)
Albumin: 4.1 g/dL (ref 3.5–5.0)
Alkaline Phosphatase: 38 U/L (ref 26–84)
BUN: 15 mg/dL (ref 7–22)
CO2: 30 mmol/L (ref 18–33)
Calcium: 10.2 mg/dL (ref 8.0–10.3)
Chloride: 100 mmol/L (ref 98–108)
Creatinine: 1 mg/dL (ref 0.60–1.20)
GLUCOSE: 180 mg/dL — AB (ref 73–118)
POTASSIUM: 4.7 mmol/L (ref 3.3–4.7)
SODIUM: 135 mmol/L (ref 128–145)
Total Bilirubin: 0.7 mg/dL (ref 0.2–1.6)
Total Protein: 6.8 g/dL (ref 6.4–8.1)

## 2018-05-20 LAB — CEA (IN HOUSE-CHCC): CEA (CHCC-In House): 1.86 ng/mL (ref 0.00–5.00)

## 2018-05-20 LAB — CBC WITH DIFFERENTIAL (CANCER CENTER ONLY)
Basophils Absolute: 0 10*3/uL (ref 0.0–0.1)
Basophils Relative: 0 %
Eosinophils Absolute: 0.2 10*3/uL (ref 0.0–0.5)
Eosinophils Relative: 3 %
HEMATOCRIT: 37.2 % (ref 34.8–46.6)
HEMOGLOBIN: 12.3 g/dL (ref 11.6–15.9)
LYMPHS ABS: 1.6 10*3/uL (ref 0.9–3.3)
Lymphocytes Relative: 22 %
MCH: 29.9 pg (ref 26.0–34.0)
MCHC: 33.1 g/dL (ref 32.0–36.0)
MCV: 90.3 fL (ref 81.0–101.0)
MONO ABS: 0.4 10*3/uL (ref 0.1–0.9)
MONOS PCT: 5 %
NEUTROS ABS: 5.2 10*3/uL (ref 1.5–6.5)
NEUTROS PCT: 70 %
Platelet Count: 200 10*3/uL (ref 145–400)
RBC: 4.12 MIL/uL (ref 3.70–5.32)
RDW: 12.1 % (ref 11.1–15.7)
WBC Count: 7.3 10*3/uL (ref 3.9–10.0)

## 2018-05-20 NOTE — Progress Notes (Signed)
Hematology and Oncology Follow Up Visit  Robin [name] [id number] 07-24-1941 77 y.o. 05/20/2018   Principle Diagnosis:  History of locally recurrent adenocarcinoma of the colon  Current Therapy:   Observation    Interim History:  Ms. Blew is here today for follow-up.  Her problem now is that she has this urticaria.  She says she developed this right before Easter.  She had not traveled anywhere.  She has had no changes in her medication.  She is not taking any type of supplements.  She says that she is seen 6 doctors.  Shockingly enough, she is yet to see a dermatologist.  She sees the allergist in September.  She is been on multiple medications for this.  Nothing seems to be working all that well.  I would think that she might need 1 of the monoclonal antibodies that have been developed.  It looks like to me she has this chronic spontaneous urticaria.  Otherwise, she is doing okay.  Patient had a CT scan of the abdomen last week.  This did not show any abnormalities.  She has had no weight loss.  There is been no weight gain.  She is had no change in bowel or bladder habits.  There is been no cough.  She is had no headache.  She is had no mouth sores.  Overall, her performance status is ECOG 1.    Medications:  Allergies as of 05/20/2018      Reactions   Statins    Aches in her bones      Medication List        Accurate as of 05/20/18 10:32 AM. Always use your most recent med list.          amLODipine 5 MG tablet Commonly known as:  NORVASC Take 5 mg by mouth Daily.   aspirin 81 MG tablet Take 81 mg by mouth daily.   beta carotene w/minerals tablet Take 1 tablet by mouth daily.   fenofibrate 54 MG tablet 54 mg daily.   fexofenadine 180 MG tablet Commonly known as:  ALLEGRA Take 1 tablet (180 mg total) by mouth 2 (two) times daily.   FLUoxetine 20 MG capsule Commonly known as:  PROZAC Take 20 mg by mouth Daily.   Garlic Oil 4196 MG Caps Take 1,000  mg by mouth daily.   Glucosamine HCl 1500 MG Tabs Take 1,500 mg by mouth daily.   levothyroxine 112 MCG tablet Commonly known as:  SYNTHROID, LEVOTHROID Take 112 mcg by mouth daily before breakfast.   metFORMIN 1000 MG tablet Commonly known as:  GLUCOPHAGE Take 1 tablet (1,000 mg total) by mouth 2 (two) times daily with a meal.   montelukast 10 MG tablet Commonly known as:  SINGULAIR Take 1 tablet (10 mg total) by mouth at bedtime.   multivitamin capsule Take 1 capsule by mouth daily.   ONETOUCH DELICA LANCETS 22W Misc USE ONE TIME A DAY   ONETOUCH VERIO test strip Generic drug:  glucose blood USE TO CHECK ONE TIME A DAY   polyvinyl alcohol 1.4 % ophthalmic solution Commonly known as:  LIQUIFILM TEARS Place 1 drop into both eyes as needed for dry eyes.   PROBIOTIC DAILY Caps Take 1 capsule by mouth every morning.   ranitidine 150 MG tablet Commonly known as:  ZANTAC Take 1 tablet (150 mg total) by mouth 2 (two) times daily.   triamterene-hydrochlorothiazide 37.5-25 MG capsule Commonly known as:  DYAZIDE Take 1 capsule by mouth Daily.  Turmeric 500 MG Tabs Take 500 mg by mouth daily.   VITAMIN D-3 PO Take 2,000 Units by mouth daily.       Allergies:  Allergies  Allergen Reactions  . Statins     Aches in her bones    Past Medical History, Surgical history, Social history, and Family History were reviewed and updated.  Review of Systems: Review of Systems  Constitutional: Negative.   HENT: Negative.   Eyes: Negative.   Respiratory: Negative.   Cardiovascular: Negative.   Gastrointestinal: Negative.   Genitourinary: Negative.   Musculoskeletal: Negative.   Skin: Negative.   Endo/Heme/Allergies: Negative.   Psychiatric/Behavioral: Negative.     Physical Exam:  weight is 153 lb (69.4 kg). Her oral temperature is 98.2 F (36.8 C). Her blood pressure is 161/65 (abnormal) and her pulse is 63. Her respiration is 18 and oxygen saturation is 97%.    Wt Readings from Last 3 Encounters:  05/20/18 153 lb (69.4 kg)  04/07/18 155 lb (70.3 kg)  03/22/18 157 lb 6.4 oz (71.4 kg)    Physical Exam  Constitutional: She is oriented to person, place, and time.  HENT:  Head: Normocephalic and atraumatic.  Mouth/Throat: Oropharynx is clear and moist.  Eyes: Pupils are equal, round, and reactive to light. EOM are normal.  Neck: Normal range of motion.  Cardiovascular: Normal rate, regular rhythm and normal heart sounds.  Pulmonary/Chest: Effort normal and breath sounds normal.  Abdominal: Soft. Bowel sounds are normal.  Musculoskeletal: Normal range of motion. She exhibits no edema, tenderness or deformity.  Lymphadenopathy:    She has no cervical adenopathy.  Neurological: She is alert and oriented to person, place, and time.  Skin: Skin is warm and dry. No rash noted. No erythema.  She has multiple areas of urticarial lesions.  They are erythematous macular lesions.  There are mostly on her anterior abdominal wall.  Psychiatric: She has a normal mood and affect. Her behavior is normal. Judgment and thought content normal.  Vitals reviewed.    Lab Results  Component Value Date   WBC 7.3 05/20/2018   HGB 12.3 05/20/2018   HCT 37.2 05/20/2018   MCV 90.3 05/20/2018   PLT 200 05/20/2018   Lab Results  Component Value Date   FERRITIN 250 10/14/2009   Lab Results  Component Value Date   RETICCTPCT 1.2 12/17/2008   RBC 4.12 05/20/2018   RETICCTABS 48.7 12/17/2008   No results found for: KPAFRELGTCHN, LAMBDASER, KAPLAMBRATIO No results found for: IGGSERUM, IGA, IGMSERUM No results found for: Odetta Pink, SPEI   Chemistry      Component Value Date/Time   NA 142 03/22/2018 1059   NA 139 05/21/2017 0838   K 4.1 03/22/2018 1059   K 4.3 05/21/2017 0838   CL 105 03/22/2018 1059   CL 102 05/18/2016 1505   CL 100 11/01/2015 0932   CO2 23 03/22/2018 1059   CO2 28 05/21/2017  0838   BUN 13 03/22/2018 1059   BUN 20.8 05/21/2017 0838   CREATININE 0.93 03/22/2018 1059   CREATININE 1.11 (H) 11/19/2017 0813   CREATININE 1.0 05/21/2017 0838      Component Value Date/Time   CALCIUM 10.6 (H) 03/22/2018 1059   CALCIUM 10.5 (H) 05/21/2017 0838   ALKPHOS 50 03/22/2018 1059   ALKPHOS 49 05/21/2017 0838   AST 37 03/22/2018 1059   AST 38 (H) 11/19/2017 0813   AST 22 05/21/2017 0838   ALT 48 (H) 03/22/2018  1059   ALT 45 11/19/2017 0813   ALT 33 05/21/2017 0838   BILITOT 0.3 03/22/2018 1059   BILITOT 0.6 11/19/2017 0813   BILITOT 0.57 05/21/2017 0838      Impression and Plan: MS. Pottenger is a very pleasant 77 yo caucasian female with history of locally recurrent adenocarcinoma of the colon with resection in 2000. She received adjuvant FOLFOX. She is doing well and has no complaints at this time.   For right now, the main problem is this urticaria.  Hopefully, the allergist will help.  I really think she needs to see a dermatologist.  She has seen one in the past so hopefully she can get an for an evaluation and may be a biopsy.  I would like to see her back in 2 months.  [name], MD 8/26/201910:32 AM

## 2018-06-24 ENCOUNTER — Ambulatory Visit (HOSPITAL_COMMUNITY)
Admission: RE | Admit: 2018-06-24 | Discharge: 2018-06-24 | Disposition: A | Discharge status: Home / Self Care | Payer: Medicare Other | Source: Ambulatory Visit | Attending: Dermatology | Admitting: Dermatology

## 2018-06-24 ENCOUNTER — Other Ambulatory Visit (HOSPITAL_COMMUNITY): Payer: Self-pay | Admitting: Dermatology

## 2018-06-24 DIAGNOSIS — L508 Other urticaria: Secondary | ICD-10-CM | POA: Insufficient documentation

## 2018-07-22 ENCOUNTER — Inpatient Hospital Stay: Payer: Medicare Other | Attending: Hematology & Oncology | Admitting: Hematology & Oncology

## 2018-07-22 ENCOUNTER — Other Ambulatory Visit: Payer: Self-pay

## 2018-07-22 ENCOUNTER — Encounter: Payer: Self-pay | Admitting: Hematology & Oncology

## 2018-07-22 ENCOUNTER — Inpatient Hospital Stay: Payer: Medicare Other

## 2018-07-22 VITALS — BP 157/57 | HR 64 | Temp 98.3°F | Resp 16 | Wt 158.0 lb

## 2018-07-22 DIAGNOSIS — Z85038 Personal history of other malignant neoplasm of large intestine: Secondary | ICD-10-CM | POA: Insufficient documentation

## 2018-07-22 DIAGNOSIS — Z79899 Other long term (current) drug therapy: Secondary | ICD-10-CM | POA: Insufficient documentation

## 2018-07-22 DIAGNOSIS — L989 Disorder of the skin and subcutaneous tissue, unspecified: Secondary | ICD-10-CM | POA: Diagnosis not present

## 2018-07-22 DIAGNOSIS — D4701 Cutaneous mastocytosis: Secondary | ICD-10-CM

## 2018-07-22 DIAGNOSIS — Z9221 Personal history of antineoplastic chemotherapy: Secondary | ICD-10-CM | POA: Diagnosis not present

## 2018-07-22 DIAGNOSIS — Z7982 Long term (current) use of aspirin: Secondary | ICD-10-CM | POA: Insufficient documentation

## 2018-07-22 DIAGNOSIS — L299 Pruritus, unspecified: Secondary | ICD-10-CM | POA: Insufficient documentation

## 2018-07-22 DIAGNOSIS — C182 Malignant neoplasm of ascending colon: Secondary | ICD-10-CM

## 2018-07-22 LAB — CBC WITH DIFFERENTIAL (CANCER CENTER ONLY)
ABS IMMATURE GRANULOCYTES: 0.03 10*3/uL (ref 0.00–0.07)
BASOS ABS: 0 10*3/uL (ref 0.0–0.1)
Basophils Relative: 0 %
Eosinophils Absolute: 0.2 10*3/uL (ref 0.0–0.5)
Eosinophils Relative: 4 %
HEMATOCRIT: 34.5 % — AB (ref 36.0–46.0)
HEMOGLOBIN: 11.3 g/dL — AB (ref 12.0–15.0)
Immature Granulocytes: 1 %
LYMPHS ABS: 1.6 10*3/uL (ref 0.7–4.0)
LYMPHS PCT: 26 %
MCH: 29 pg (ref 26.0–34.0)
MCHC: 32.8 g/dL (ref 30.0–36.0)
MCV: 88.5 fL (ref 80.0–100.0)
Monocytes Absolute: 0.6 10*3/uL (ref 0.1–1.0)
Monocytes Relative: 9 %
NEUTROS ABS: 3.8 10*3/uL (ref 1.7–7.7)
Neutrophils Relative %: 60 %
PLATELETS: 176 10*3/uL (ref 150–400)
RBC: 3.9 MIL/uL (ref 3.87–5.11)
RDW: 12.6 % (ref 11.5–15.5)
WBC: 6.3 10*3/uL (ref 4.0–10.5)
nRBC: 0 % (ref 0.0–0.2)

## 2018-07-22 LAB — CMP (CANCER CENTER ONLY)
ALK PHOS: 42 U/L (ref 38–126)
ALT: 32 U/L (ref 0–44)
AST: 24 U/L (ref 15–41)
Albumin: 3.8 g/dL (ref 3.5–5.0)
Anion gap: 8 (ref 5–15)
BUN: 16 mg/dL (ref 8–23)
CALCIUM: 9.8 mg/dL (ref 8.9–10.3)
CHLORIDE: 104 mmol/L (ref 98–111)
CO2: 27 mmol/L (ref 22–32)
CREATININE: 1.01 mg/dL — AB (ref 0.44–1.00)
GFR, Estimated: 52 mL/min — ABNORMAL LOW (ref >60)
Glucose, Bld: 146 mg/dL — ABNORMAL HIGH (ref 70–99)
Potassium: 4.1 mmol/L (ref 3.5–5.1)
SODIUM: 139 mmol/L (ref 135–145)
Total Bilirubin: 0.5 mg/dL (ref 0.3–1.2)
Total Protein: 6.4 g/dL — ABNORMAL LOW (ref 6.5–8.1)

## 2018-07-22 LAB — LACTATE DEHYDROGENASE: LDH: 144 U/L (ref 98–192)

## 2018-07-22 NOTE — Progress Notes (Signed)
Hematology and Oncology Follow Up Visit  Robin [name] [id number] 1941/09/01 77 y.o. 07/22/2018   Principle Diagnosis:  History of locally recurrent adenocarcinoma of the colon  Current Therapy:   Observation    Interim History:  Robin [name] is here today for follow-up.  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  I am surprised at this.  She saw her dermatologist.  Her dermatologist did a tryptase level on her tryptase level was 27.  This is incredibly high.  The bad part now is that she was referred to some dermatologist in [place].  And she cannot see this doctor until February.  This is absurd.  I cannot believe that it would take 4 months to get into see a dermatologist.  She has these urticarial lesions.  Again, why these have not been biopsied I have no idea.  As far as any kind of colon cancer is concerned, I do not think this is an issue.    she does have quite a lot of pruritus.  She is on Pepcid for this.  She she is had no change in bowel or bladder habits.  There is been no cough.  She is had no headache.  She is had no mouth sores.  Overall, her performance status is ECOG 1.    Medications:  Allergies as of 07/22/2018      Reactions   Statins    Aches in her bones      Medication List        Accurate as of 07/22/18 10:10 AM. Always use your most recent med list.          amLODipine 5 MG tablet Commonly known as:  NORVASC Take 5 mg by mouth Daily.   aspirin 81 MG tablet Take 81 mg by mouth daily.   beta carotene w/minerals tablet Take 1 tablet by mouth daily.   cetirizine 10 MG tablet Commonly known as:  ZYRTEC Take 20 mg by mouth daily.   fenofibrate 54 MG tablet 54 mg daily.   fexofenadine 180 MG tablet Commonly known as:  ALLEGRA Take 1 tablet (180 mg total) by mouth 2 (two) times daily.   FLUoxetine 20 MG capsule Commonly known as:  PROZAC Take 20 mg by mouth Daily.   Garlic Oil 2542 MG Caps Take  1,000 mg by mouth daily.   Glucosamine HCl 1500 MG Tabs Take 1,500 mg by mouth daily.   hydrOXYzine 25 MG tablet Commonly known as:  ATARAX/VISTARIL Take 25 mg by mouth at bedtime.   levothyroxine 112 MCG tablet Commonly known as:  SYNTHROID, LEVOTHROID Take 112 mcg by mouth daily before breakfast.   metFORMIN 1000 MG tablet Commonly known as:  GLUCOPHAGE Take 1 tablet (1,000 mg total) by mouth 2 (two) times daily with a meal.   montelukast 10 MG tablet Commonly known as:  SINGULAIR Take 1 tablet (10 mg total) by mouth at bedtime.   multivitamin capsule Take 1 capsule by mouth daily.   ONETOUCH DELICA LANCETS 70W Misc USE ONE TIME A DAY   ONETOUCH VERIO test strip Generic drug:  glucose blood USE TO CHECK ONE TIME A DAY   polyvinyl alcohol 1.4 % ophthalmic solution Commonly known as:  LIQUIFILM TEARS Place 1 drop into both eyes as needed for dry eyes.   PROBIOTIC DAILY Caps Take 1 capsule by mouth every morning.   ranitidine 150 MG tablet Commonly known as:  ZANTAC Take 1 tablet (150 mg total) by mouth  2 (two) times daily.   triamterene-hydrochlorothiazide 37.5-25 MG capsule Commonly known as:  DYAZIDE Take 1 capsule by mouth Daily.   Turmeric 500 MG Tabs Take 500 mg by mouth daily.   VITAMIN D-3 PO Take 2,000 Units by mouth daily.       Allergies:  Allergies  Allergen Reactions  . Statins     Aches in her bones    Past Medical History, Surgical history, Social history, and Family History were reviewed and updated.  Review of Systems: Review of Systems  Constitutional: Negative.   HENT: Negative.   Eyes: Negative.   Respiratory: Negative.   Cardiovascular: Negative.   Gastrointestinal: Negative.   Genitourinary: Negative.   Musculoskeletal: Negative.   Skin: Negative.   Endo/Heme/Allergies: Negative.   Psychiatric/Behavioral: Negative.     Physical Exam:  weight is 158 lb (71.7 kg). Her oral temperature is 98.3 F (36.8 C). Her blood  pressure is 157/57 (abnormal) and her pulse is 64. Her respiration is 16 and oxygen saturation is 99%.   Wt Readings from Last 3 Encounters:  07/22/18 158 lb (71.7 kg)  05/20/18 153 lb (69.4 kg)  04/07/18 155 lb (70.3 kg)    Physical Exam  Constitutional: She is oriented to person, place, and time.  HENT:  Head: Normocephalic and atraumatic.  Mouth/Throat: Oropharynx is clear and moist.  Eyes: Pupils are equal, round, and reactive to light. EOM are normal.  Neck: Normal range of motion.  Cardiovascular: Normal rate, regular rhythm and normal heart sounds.  Pulmonary/Chest: Effort normal and breath sounds normal.  Abdominal: Soft. Bowel sounds are normal.  Musculoskeletal: Normal range of motion. She exhibits no edema, tenderness or deformity.  Lymphadenopathy:    She has no cervical adenopathy.  Neurological: She is alert and oriented to person, place, and time.  Skin: Skin is warm and dry. No rash noted. No erythema.  She has multiple areas of urticarial lesions.  They are erythematous macular lesions.  There are mostly on her anterior abdominal wall.  Psychiatric: She has a normal mood and affect. Her behavior is normal. Judgment and thought content normal.  Vitals reviewed.    Lab Results  Component Value Date   WBC 6.3 07/22/2018   HGB 11.3 (L) 07/22/2018   HCT 34.5 (L) 07/22/2018   MCV 88.5 07/22/2018   PLT 176 07/22/2018   Lab Results  Component Value Date   FERRITIN 250 10/14/2009   Lab Results  Component Value Date   RETICCTPCT 1.2 12/17/2008   RBC 3.90 07/22/2018   RETICCTABS 48.7 12/17/2008   No results found for: KPAFRELGTCHN, LAMBDASER, KAPLAMBRATIO No results found for: Kandis Cocking, IGMSERUM No results found for: Odetta Pink, SPEI   Chemistry      Component Value Date/Time   NA 135 05/20/2018 0928   NA 142 03/22/2018 1059   NA 139 05/21/2017 0838   K 4.7 05/20/2018 0928   K 4.3 05/21/2017  0838   CL 100 05/20/2018 0928   CL 102 05/18/2016 1505   CL 100 11/01/2015 0932   CO2 30 05/20/2018 0928   CO2 28 05/21/2017 0838   BUN 15 05/20/2018 0928   BUN 13 03/22/2018 1059   BUN 20.8 05/21/2017 0838   CREATININE 1.00 05/20/2018 0928   CREATININE 1.0 05/21/2017 0838      Component Value Date/Time   CALCIUM 10.2 05/20/2018 0928   CALCIUM 10.5 (H) 05/21/2017 0838   ALKPHOS 38 05/20/2018 0928   ALKPHOS 49  05/21/2017 0838   AST 32 05/20/2018 0928   AST 22 05/21/2017 0838   ALT 40 05/20/2018 0928   ALT 33 05/21/2017 0838   BILITOT 0.7 05/20/2018 0928   BILITOT 0.57 05/21/2017 0838      Impression and Plan: MS. Ke is a very pleasant 77 yo caucasian female with history of locally recurrent adenocarcinoma of the colon with resection in 2000. She received adjuvant FOLFOX. She is doing well and has no complaints at this time.   I think that it is apparent that we will have to take the "lead" on this potential mastocytosis.  I will had to set her up with a bone marrow biopsy.  With a tryptase of 27, 1 my thing that she actually has systemic mastocytosis.  We did do a CT scan of her abdomen back in August.  There is no hepatomegaly or splenomegaly.  She had no lymphadenopathy.  I am repeating her tryptase level.  I am also sending off an IgE level.  With the bone marrow biopsy, I will had to make sure that we have a C-kit test sent off.  I would want to see her back in about a month.  I think this is really good to be interesting to see what happens with this mastocytosis diagnosis.    [name], MD 10/28/201910:10 AM

## 2018-07-23 LAB — TRYPTASE: Tryptase: 21.1 ug/L — ABNORMAL HIGH (ref 2.2–13.2)

## 2018-07-24 LAB — IGE: IgE (Immunoglobulin E), Serum: <2 IU/mL — ABNORMAL LOW (ref 6–495)

## 2018-07-29 ENCOUNTER — Ambulatory Visit: Payer: Medicare Other | Admitting: Internal Medicine

## 2018-07-29 DIAGNOSIS — Z0289 Encounter for other administrative examinations: Secondary | ICD-10-CM

## 2018-07-31 ENCOUNTER — Other Ambulatory Visit: Payer: Self-pay | Admitting: Physician Assistant

## 2018-08-01 ENCOUNTER — Encounter (HOSPITAL_COMMUNITY): Payer: Self-pay

## 2018-08-01 ENCOUNTER — Ambulatory Visit (HOSPITAL_COMMUNITY)
Admission: RE | Admit: 2018-08-01 | Discharge: 2018-08-01 | Disposition: A | Discharge status: Home / Self Care | Payer: Medicare Other | Source: Ambulatory Visit | Attending: Hematology & Oncology | Admitting: Hematology & Oncology

## 2018-08-01 ENCOUNTER — Other Ambulatory Visit: Payer: Self-pay

## 2018-08-01 DIAGNOSIS — D4702 Systemic mastocytosis: Secondary | ICD-10-CM | POA: Insufficient documentation

## 2018-08-01 DIAGNOSIS — G4730 Sleep apnea, unspecified: Secondary | ICD-10-CM | POA: Diagnosis not present

## 2018-08-01 DIAGNOSIS — R51 Headache: Secondary | ICD-10-CM | POA: Insufficient documentation

## 2018-08-01 DIAGNOSIS — M1990 Unspecified osteoarthritis, unspecified site: Secondary | ICD-10-CM | POA: Diagnosis not present

## 2018-08-01 DIAGNOSIS — Z79899 Other long term (current) drug therapy: Secondary | ICD-10-CM | POA: Diagnosis not present

## 2018-08-01 DIAGNOSIS — Z85038 Personal history of other malignant neoplasm of large intestine: Secondary | ICD-10-CM | POA: Insufficient documentation

## 2018-08-01 DIAGNOSIS — Z7982 Long term (current) use of aspirin: Secondary | ICD-10-CM | POA: Diagnosis not present

## 2018-08-01 DIAGNOSIS — Z888 Allergy status to other drugs, medicaments and biological substances status: Secondary | ICD-10-CM | POA: Insufficient documentation

## 2018-08-01 DIAGNOSIS — I10 Essential (primary) hypertension: Secondary | ICD-10-CM | POA: Diagnosis not present

## 2018-08-01 DIAGNOSIS — D4701 Cutaneous mastocytosis: Secondary | ICD-10-CM | POA: Diagnosis not present

## 2018-08-01 DIAGNOSIS — Z7984 Long term (current) use of oral hypoglycemic drugs: Secondary | ICD-10-CM | POA: Insufficient documentation

## 2018-08-01 DIAGNOSIS — Z79890 Hormone replacement therapy (postmenopausal): Secondary | ICD-10-CM | POA: Insufficient documentation

## 2018-08-01 DIAGNOSIS — Z90710 Acquired absence of both cervix and uterus: Secondary | ICD-10-CM | POA: Diagnosis not present

## 2018-08-01 LAB — PROTIME-INR
INR: 0.98
PROTHROMBIN TIME: 12.9 s (ref 11.4–15.2)

## 2018-08-01 LAB — CBC WITH DIFFERENTIAL/PLATELET
ABS IMMATURE GRANULOCYTES: 0.02 10*3/uL (ref 0.00–0.07)
BASOS ABS: 0 10*3/uL (ref 0.0–0.1)
Basophils Relative: 0 %
EOS ABS: 0.2 10*3/uL (ref 0.0–0.5)
Eosinophils Relative: 3 %
HEMATOCRIT: 36.7 % (ref 36.0–46.0)
Hemoglobin: 12 g/dL (ref 12.0–15.0)
IMMATURE GRANULOCYTES: 0 %
Lymphocytes Relative: 26 %
Lymphs Abs: 1.7 10*3/uL (ref 0.7–4.0)
MCH: 29.5 pg (ref 26.0–34.0)
MCHC: 32.7 g/dL (ref 30.0–36.0)
MCV: 90.2 fL (ref 80.0–100.0)
Monocytes Absolute: 0.5 10*3/uL (ref 0.1–1.0)
Monocytes Relative: 7 %
NEUTROS ABS: 4.1 10*3/uL (ref 1.7–7.7)
NEUTROS PCT: 64 %
NRBC: 0 % (ref 0.0–0.2)
PLATELETS: 218 10*3/uL (ref 150–400)
RBC: 4.07 MIL/uL (ref 3.87–5.11)
RDW: 12.6 % (ref 11.5–15.5)
WBC: 6.5 10*3/uL (ref 4.0–10.5)

## 2018-08-01 LAB — APTT: aPTT: 29 seconds (ref 24–36)

## 2018-08-01 LAB — GLUCOSE, CAPILLARY: Glucose-Capillary: 142 mg/dL — ABNORMAL HIGH (ref 70–99)

## 2018-08-01 MED ORDER — MIDAZOLAM HCL 2 MG/2ML IJ SOLN
INTRAMUSCULAR | Status: AC
  Filled 2018-08-01: qty 2

## 2018-08-01 MED ORDER — LIDOCAINE HCL (PF) 1 % IJ SOLN
INTRAMUSCULAR | Status: AC | PRN
  Administered 2018-08-01: 10 mL

## 2018-08-01 MED ORDER — MIDAZOLAM HCL 2 MG/2ML IJ SOLN
INTRAMUSCULAR | Status: AC | PRN
  Administered 2018-08-01 (×2): 1 mg via INTRAVENOUS

## 2018-08-01 MED ORDER — FENTANYL CITRATE (PF) 100 MCG/2ML IJ SOLN
INTRAMUSCULAR | Status: AC | PRN
  Administered 2018-08-01 (×2): 50 ug via INTRAVENOUS

## 2018-08-01 MED ORDER — SODIUM CHLORIDE 0.9 % IV SOLN
INTRAVENOUS | Status: DC
  Administered 2018-08-01: 09:00:00 via INTRAVENOUS

## 2018-08-01 MED ORDER — FENTANYL CITRATE (PF) 100 MCG/2ML IJ SOLN
INTRAMUSCULAR | Status: AC
  Filled 2018-08-01: qty 2

## 2018-08-01 NOTE — H&P (Signed)
Chief Complaint: Patient was seen in consultation today for bone marrow biopsy  Referring Physician(s): Ennever,Peter R  Supervising Physician: Aletta Edouard  Patient Status: [facility] - Out-pt  History of Present Illness: Robin [name] is a 77 y.o. female with a past medical history significant for HTN, chronic headaches, urticaria and colon cancer followed by Dr. Marin Olp. Patient presents today at the request of Dr. Marin Olp for image guided bone marrow biopsy to evaluate for mastocytosis. Patient has been having urticarial lesions for several months which are very pruritic and not helped by medications. She was seen by Dr. Nelva Bush for these urticarial lesions - tryptase level was obtained at that time which was noted to be high at 27. Patient reports she is allergic to red meat and believes this may be the cause of these lesions, however she has avoided red meat for several months without improvement in symptoms and per Dr. Jeralyn Ruths note lab work does not indicate a red meat allergy. Dr. Marin Olp has requested a bone marrow biopsy to evaluate for systemic mastocytosis as the cause of her urticarial lesions.   Patient reports she continues to have daily headaches that are mild to moderate most days and unchanged by medications. She also continues to have a lot of itching due to skin lesions without alleviating or aggravating factors that she has noticed. She states understanding of the procedure today and wishes to proceed.  Past Medical History:  Diagnosis Date  . Arthritis   . Colon cancer ([place])    colon ca dx 2008 and 2010  . Complication of anesthesia    problems waking up-patient thinks she was given too much medication  . Gestational diabetes   . Headache    headaches all the time  . Hypertension   . Sleep apnea    uses CPAP  . Urticaria     Past Surgical History:  Procedure Laterality Date  . ABDOMINAL HYSTERECTOMY    . APPENDECTOMY    . BACK SURGERY  2009  . CARPAL  TUNNEL RELEASE    . COLON SURGERY    . COLONOSCOPY WITH PROPOFOL N/A 08/10/2015   Procedure: COLONOSCOPY WITH PROPOFOL;  Surgeon: Garlan Fair, MD;  Location: [facility];  Service: Endoscopy;  Laterality: N/A;  . DILATION AND CURETTAGE OF UTERUS     had before had hysterectomy  . ESOPHAGOGASTRODUODENOSCOPY (EGD) WITH PROPOFOL N/A 08/10/2015   Procedure: ESOPHAGOGASTRODUODENOSCOPY (EGD) WITH PROPOFOL;  Surgeon: Garlan Fair, MD;  Location: [facility];  Service: Endoscopy;  Laterality: N/A;  . EYE SURGERY     bilateral cataract surgery   . ROTATOR CUFF REPAIR      Allergies: Statins  Medications: Prior to Admission medications   Medication Sig Start Date End Date Taking? Authorizing Provider  amLODipine (NORVASC) 5 MG tablet Take 5 mg by mouth Daily. 07/28/11  Yes [provider]  aspirin 81 MG tablet Take 81 mg by mouth daily.   Yes [provider]  beta carotene w/minerals (OCUVITE) tablet Take 1 tablet by mouth daily.   Yes [provider]  cetirizine (ZYRTEC) 10 MG tablet Take 20 mg by mouth daily. 07/18/18  Yes [provider]  Cholecalciferol (VITAMIN D-3 PO) Take 2,000 Units by mouth daily.   Yes [provider]  fexofenadine (ALLEGRA) 180 MG tablet Take 1 tablet (180 mg total) by mouth 2 (two) times daily. 04/01/18  Yes Padgett, Rae Halsted, MD  FLUoxetine (PROZAC) 20 MG capsule Take 20 mg by mouth Daily. 07/27/11  Yes [provider]  Garlic Oil 7829 MG CAPS Take 1,000 mg by mouth daily.    Yes [provider]  Glucosamine HCl 1500 MG TABS Take 1,500 mg by mouth daily.    Yes [provider]  hydrOXYzine (ATARAX/VISTARIL) 25 MG tablet Take 25 mg by mouth at bedtime. 07/12/18  Yes [provider]  levothyroxine (SYNTHROID, LEVOTHROID) 112 MCG tablet Take 112 mcg by mouth daily before breakfast.  06/24/15  Yes [provider]  metFORMIN (GLUCOPHAGE) 1000 MG tablet Take 1 tablet  (1,000 mg total) by mouth 2 (two) times daily with a meal. Patient taking differently: Take 1,000 mg daily by mouth.  04/04/17  Yes Philemon Kingdom, MD  montelukast (SINGULAIR) 10 MG tablet Take 1 tablet (10 mg total) by mouth at bedtime. 03/22/18  Yes Padgett, Rae Halsted, MD  Multiple Vitamin (MULTIVITAMIN) capsule Take 1 capsule by mouth daily.   Yes [provider]  Jonetta Speak LANCETS 56O MISC USE ONE TIME A DAY 03/15/17  Yes Philemon Kingdom, MD  [facility] VERIO test strip USE TO CHECK ONE TIME A DAY 03/15/17  Yes Philemon Kingdom, MD  polyvinyl alcohol (LIQUIFILM TEARS) 1.4 % ophthalmic solution Place 1 drop into both eyes as needed for dry eyes.   Yes [provider]  Probiotic Product (PROBIOTIC DAILY) CAPS Take 1 capsule by mouth every morning.    Yes [provider]  ranitidine (ZANTAC) 150 MG tablet Take 1 tablet (150 mg total) by mouth 2 (two) times daily. 03/22/18  Yes Padgett, Rae Halsted, MD  triamterene-hydrochlorothiazide (DYAZIDE) 37.5-25 MG per capsule Take 1 capsule by mouth Daily.  07/27/11  Yes [provider]  Turmeric 500 MG TABS Take 500 mg by mouth daily.    Yes [provider]  fenofibrate 54 MG tablet 54 mg daily. 11/13/17   [provider]     Family History  Problem Relation Age of Onset  . Migraines Neg Hx     Social History   Socioeconomic History  . Marital status: Divorced    Spouse name: Not on file  . Number of children: 4  . Years of education: 12+  . Highest education level: Not on file  Occupational History  . Occupation: [employer]  Social Needs  . Financial resource strain: Not on file  . Food insecurity:    Worry: Not on file    Inability: Not on file  . Transportation needs:    Medical: Not on file    Non-medical: Not on file  Tobacco Use  . Smoking status: Never Smoker  . Smokeless tobacco: Never Used  . Tobacco comment: never used tobacco  Substance and Sexual  Activity  . Alcohol use: No    Alcohol/week: 0.0 standard drinks  . Drug use: No  . Sexual activity: Not on file  Lifestyle  . Physical activity:    Days per week: Not on file    Minutes per session: Not on file  . Stress: Not on file  Relationships  . Social connections:    Talks on phone: Not on file    Gets together: Not on file    Attends religious service: Not on file    Active member of club or organization: Not on file    Attends meetings of clubs or organizations: Not on file    Relationship status: Not on file  Other Topics Concern  . Not on file  Social History Narrative   Lives at home with herself.  Caffeine use: Drinks coffee and tea (1 cup coffee per day). Tea rarely      Review of Systems: A 12 point ROS discussed and pertinent positives are indicated in the HPI above.  All other systems are negative.  Review of Systems  Constitutional: Positive for chills (for about a week) and fatigue. Negative for appetite change and fever.  Respiratory: Negative for cough and shortness of breath.   Cardiovascular: Negative for chest pain.  Gastrointestinal: Negative for abdominal distention, abdominal pain, diarrhea, nausea and vomiting.  Musculoskeletal: Negative for arthralgias and myalgias.  Skin: Positive for rash.  Neurological: Negative for dizziness and syncope.  Psychiatric/Behavioral: Negative for confusion.    Vital Signs: BP (!) 154/95   Pulse 61   Temp 98.4 F (36.9 C) (Oral)   Resp 14   SpO2 97%   Physical Exam  Constitutional: She is oriented to person, place, and time. No distress.  HENT:  Head: Normocephalic.  Cardiovascular: Normal rate, regular rhythm and normal heart sounds.  Pulmonary/Chest: Effort normal and breath sounds normal.  Abdominal: Soft. Bowel sounds are normal. She exhibits no distension. There is no tenderness.  Neurological: She is alert and oriented to person, place, and time.  Skin: Skin is warm and dry. Rash (multiple  urticarial lesions in various stages of healing on bilateral upper extremities) noted. She is not diaphoretic.  Psychiatric: She has a normal mood and affect. Her behavior is normal. Judgment and thought content normal.  Vitals reviewed.    MD Evaluation Airway: WNL Heart: WNL Abdomen: WNL Chest/ Lungs: WNL ASA  Classification: 3 Mallampati/Airway Score: One   Imaging: No results found.  Labs:  CBC: Recent Labs    11/19/17 0820 03/22/18 1059 05/20/18 0928 07/22/18 0922 08/01/18 0906  WBC 5.8 5.7 7.3 6.3 6.5  HGB 12.6 12.3 12.3 11.3* 12.0  HCT 36.9 37.1 37.2 34.5* 36.7  PLT 194  --  200 176 218    COAGS: Recent Labs    08/01/18 0906  INR 0.98  APTT 29    BMP: Recent Labs    11/19/17 0813 03/22/18 1059 05/20/18 0928 07/22/18 0922  NA 139 142 135 139  K 4.2 4.1 4.7 4.1  CL 102 105 100 104  CO2 27 23 30 27   GLUCOSE 155* 129* 180* 146*  BUN 16 13 15 16   CALCIUM 11.2* 10.6* 10.2 9.8  CREATININE 1.11* 0.93 1.00 1.01*  GFRNONAA 47* 59*  --  52*  GFRAA 54* 69  --  >60    LIVER FUNCTION TESTS: Recent Labs    11/19/17 0813 03/22/18 1059 05/20/18 0928 07/22/18 0922  BILITOT 0.6 0.3 0.7 0.5  AST 38* 37 32 24  ALT 45 48* 40 32  ALKPHOS 49 50 38 42  PROT 7.3 6.9 6.8 6.4*  ALBUMIN 4.3 4.6 4.1 3.8    TUMOR MARKERS: No results for input(s): AFPTM, CEA, CA199, CHROMGRNA in the last 8760 hours.  Assessment and Plan:  Patient with history of adenocarcinoma of the colon followed by Dr. Marin Olp presents today for image guided bone marrow biopsy to evaluate for systemic mastocytosis due to ongoing urticarial lesions with unknown cause. Patient states understanding to indications of procedure to be performed today and wishes to proceed.  Patient has been NPO since 7 pm yesterday, she does not take blood thinning medications. She is afebrile, WBC 6.5, H/H 12.0/36.7, INR 0.98.   Risks and benefits discussed with the patient including, but not limited to  bleeding, infection, damage to adjacent  structures or low yield requiring additional tests.  All of the patient's questions were answered, patient is agreeable to proceed.  Consent signed and in chart.  Thank you for this interesting consult.  I greatly enjoyed meeting KATHARINA JEHLE and look forward to participating in their care.  A copy of this report was sent to the requesting provider on this date.  Electronically Signed: Joaquim Nam, PA-C 08/01/2018, 9:51 AM   I spent a total of  30 Minutes in face to face in clinical consultation, greater than 50% of which was counseling/coordinating care for bone marrow biopsy.

## 2018-08-01 NOTE — Discharge Instructions (Signed)
Moderate Conscious Sedation, Adult, Care After °These instructions provide you with information about caring for yourself after your procedure. Your health care provider may also give you more specific instructions. Your treatment has been planned according to current medical practices, but problems sometimes occur. Call your health care provider if you have any problems or questions after your procedure. °What can I expect after the procedure? °After your procedure, it is common: °· To feel sleepy for several hours. °· To feel clumsy and have poor balance for several hours. °· To have poor judgment for several hours. °· To vomit if you eat too soon. ° °Follow these instructions at home: °For at least 24 hours after the procedure: ° °· Do not: °? Participate in activities where you could fall or become injured. °? Drive. °? Use heavy machinery. °? Drink alcohol. °? Take sleeping pills or medicines that cause drowsiness. °? Make important decisions or sign legal documents. °? Take care of children on your own. °· Rest. °Eating and drinking °· Follow the diet recommended by your health care provider. °· If you vomit: °? Drink water, juice, or soup when you can drink without vomiting. °? Make sure you have little or no nausea before eating solid foods. °General instructions °· Have a responsible adult stay with you until you are awake and alert. °· Take over-the-counter and prescription medicines only as told by your health care provider. °· If you smoke, do not smoke without supervision. °· Keep all follow-up visits as told by your health care provider. This is important. °Contact a health care provider if: °· You keep feeling nauseous or you keep vomiting. °· You feel light-headed. °· You develop a rash. °· You have a fever. °Get help right away if: °· You have trouble breathing. °This information is not intended to replace advice given to you by your health care provider. Make sure you discuss any questions you have  with your health care provider. °Document Released: 07/02/2013 Document Revised: 02/14/2016 Document Reviewed: 01/01/2016 °Elsevier Interactive Patient Education © 2018 [employer] ° ° °Bone Marrow Aspiration and Bone Marrow Biopsy, Adult, Care After °This sheet gives you information about how to care for yourself after your procedure. Your health care provider may also give you more specific instructions. If you have problems or questions, contact your health care provider. °What can I expect after the procedure? °After the procedure, it is common to have: °· Mild pain and tenderness. °· Swelling. °· Bruising. ° °Follow these instructions at home: °· Take over-the-counter or prescription medicines only as told by your health care provider. °· Do not take baths, swim, or use a hot tub until your health care provider approves. Ask if you can take a shower or have a sponge bath.  You may shower tomorrow. °· Follow instructions from your health care provider about how to take care of the puncture site. Make sure you: °? Wash your hands with soap and water before you change your bandage (dressing). If soap and water are not available, use hand sanitizer. °? Change your dressing as told by your health care provider.  You may remove your dressing tomorrow. °· Check your puncture site every day for signs of infection. Check for: °? More redness, swelling, or pain. °? More fluid or blood. °? Warmth. °? Pus or a bad smell. °· Return to your normal activities as told by your health care provider. Ask your health care provider what activities are safe for you. °· Do not drive   for 24 hours if you were given a medicine to help you relax (sedative). °· Keep all follow-up visits as told by your health care provider. This is important. °Contact a health care provider if: °· You have more redness, swelling, or pain around the puncture site. °· You have more fluid or blood coming from the puncture site. °· Your puncture site feels  warm to the touch. °· You have pus or a bad smell coming from the puncture site. °· You have a fever. °· Your pain is not controlled with medicine. °This information is not intended to replace advice given to you by your health care provider. Make sure you discuss any questions you have with your health care provider. °Document Released: 03/31/2005 Document Revised: 03/31/2016 Document Reviewed: 02/23/2016 °Elsevier Interactive Patient Education © 2018 Elsevier Inc. ° °

## 2018-08-01 NOTE — Procedures (Signed)
Interventional Radiology Procedure Note  Procedure: CT guided bone marrow aspiration and biopsy  Complications: None  EBL: < 10 mL  Findings: Aspirate and core biopsy performed of bone marrow in right iliac bone.  Plan: Bedrest supine x 1 hrs  Mariella Blackwelder T. Demarquis Osley, M.D Pager:  [phone]   

## 2018-08-08 ENCOUNTER — Encounter (HOSPITAL_COMMUNITY): Payer: Self-pay | Admitting: Hematology & Oncology

## 2018-08-08 ENCOUNTER — Telehealth: Payer: Self-pay | Admitting: Hematology & Oncology

## 2018-08-08 LAB — MISC LABCORP TEST (SEND OUT): Labcorp test code: 480940

## 2018-08-08 NOTE — Telephone Encounter (Signed)
sw pt to sch lab/md appt per Dr Marin Olp. Pt stated she will be out of town for the holidays. Pt scheduled appt for 08/30/18 at 130 pm

## 2018-08-12 ENCOUNTER — Ambulatory Visit: Payer: Medicare Other | Admitting: Internal Medicine

## 2018-08-12 ENCOUNTER — Encounter: Payer: Self-pay | Admitting: Internal Medicine

## 2018-08-12 VITALS — BP 160/80 | HR 67 | Ht 61.0 in | Wt 153.0 lb

## 2018-08-12 DIAGNOSIS — E1169 Type 2 diabetes mellitus with other specified complication: Secondary | ICD-10-CM | POA: Diagnosis not present

## 2018-08-12 DIAGNOSIS — E785 Hyperlipidemia, unspecified: Secondary | ICD-10-CM

## 2018-08-12 DIAGNOSIS — E1121 Type 2 diabetes mellitus with diabetic nephropathy: Secondary | ICD-10-CM

## 2018-08-12 DIAGNOSIS — E049 Nontoxic goiter, unspecified: Secondary | ICD-10-CM

## 2018-08-12 LAB — POCT GLYCOSYLATED HEMOGLOBIN (HGB A1C): Hemoglobin A1C: 6.4 % — AB (ref 4.0–5.6)

## 2018-08-12 NOTE — Progress Notes (Signed)
Patient ID: Robin [name], female   DOB: 1940-11-28, 77 y.o.   MRN: [id number]  HPI: Robin [name] is a 77 y.o.-year-old female, returning for f/u for DM2, initially GDM, then DM2 dx in 05/2015, insulin-independent, controlled, with complications (mild CKD). Last visit 6 months ago.  She continues to have a pruritic maculopapular generalized rash since this Spring >> was on steroids since then.  She saw PCP and dermatology and no etiology was found.she then saw Dr. Marin Olp - suspicion for mastocytosis  - BM Bx negative, though.  Continues on antihistamines.  She will have allergy testing soon.  Last hemoglobin A1c: Lab Results  Component Value Date   HGBA1C 6.7 01/25/2018   HGBA1C 6.5% 08/06/2017   HGBA1C 6.6 04/04/2017   Pt is on a regimen of: - Metformin 1000 mg with dinner >> 1000 mg 2x a day >> now again 1000 mg daily because of diarrhea  Pt checks her sugars once a day-per download of her meter: - am:156 >> n/c >> 120-130 >> 144 - 2h after b'fast: n/c - before lunch:n/c >> 92 >> n/c >> 82-120, 218  - 2h after lunch: 183-105, 154 >> n/c - before dinner:  70s, 110-120, 146 >> 94, 104 >> 87-106, 116 - 2h after dinner:  83 >> 120-130 >> n/c - bedtime: 115, 116 >> 94  >> n/c - nighttime: n/c Lowest sugar was 70s >> 82; she has hypoglycemia awareness in the 70s. Highest sugar was 145 >> 218 (steroids).  Glucometer: One Touch  She stopped eating meat as she develops a rash with it.  Of note, alpha gal mutation was negative. Pt's meals are: - Breakfast: cheerios + raisin bran or cornflakes + 2% milk or bacon + egg + toast - Lunch: salad or soup or sandwich - Dinner: veggies + starch - Snacks: no desserts; apple, chips She saw a dietitian 07/09/2015-this helped a lot.  -+ Mild CKD, last BUN/creatinine:  Lab Results  Component Value Date   BUN 16 07/22/2018   CREATININE 1.01 (H) 07/22/2018   -+ HL; last set of lipids: Recent lipids: TG 600 >> started Fenofibrate >> TG  200s 10/10/2016: 246/381/40/130   Lab Results  Component Value Date   CHOL 208 (H) 11/01/2015   HDL 40 11/01/2015   LDLCALC 131 (H) 11/01/2015   TRIG 183 (H) 11/01/2015   CHOLHDL 5.2 (H) 11/01/2015  She is intolerant to statins due to development of bone pain..  She tried Benecol spread but did not like it.  She was taken off fenofibrate b/c rash.  - last eye exam was in 2019: No DR -Denies numbness and tingling in her feet.  She also has HTN, hyperlipidemia-intolerant to statins (atorvastatin, rosuvastatin), OSA, depression, osteoarthritis, spinal stenosis, osteopenia, GERD, CKD stage III. + h/o colon cancer.  ROS: Constitutional: no weight gain/no weight loss, no fatigue, no subjective hyperthermia, no subjective hypothermia Eyes: no blurry vision, no xerophthalmia ENT: no sore throat, + see HPI Cardiovascular: no CP/no SOB/no palpitations/no leg swelling Respiratory: no cough/no SOB/no wheezing Gastrointestinal: no N/no V/no D/no C/no acid reflux Musculoskeletal: no muscle aches/no joint aches Skin: + Rash, + hair loss Neurological: no tremors/no numbness/no tingling/no dizziness  I reviewed pt's medications, allergies, PMH, social hx, family hx, and changes were documented in the history of present illness. Otherwise, unchanged from my initial visit note.  Past Medical History:  Diagnosis Date  . Arthritis   . Colon cancer ([place])    colon ca dx 2008 and 2010  .  Complication of anesthesia    problems waking up-patient thinks she was given too much medication  . Gestational diabetes   . Headache    headaches all the time  . Hypertension   . Sleep apnea    uses CPAP  . Urticaria    Past Surgical History:  Procedure Laterality Date  . ABDOMINAL HYSTERECTOMY    . APPENDECTOMY    . BACK SURGERY  2009  . CARPAL TUNNEL RELEASE    . COLON SURGERY    . COLONOSCOPY WITH PROPOFOL N/A 08/10/2015   Procedure: COLONOSCOPY WITH PROPOFOL;  Surgeon: Garlan Fair, MD;   Location: [facility];  Service: Endoscopy;  Laterality: N/A;  . DILATION AND CURETTAGE OF UTERUS     had before had hysterectomy  . ESOPHAGOGASTRODUODENOSCOPY (EGD) WITH PROPOFOL N/A 08/10/2015   Procedure: ESOPHAGOGASTRODUODENOSCOPY (EGD) WITH PROPOFOL;  Surgeon: Garlan Fair, MD;  Location: [facility];  Service: Endoscopy;  Laterality: N/A;  . EYE SURGERY     bilateral cataract surgery   . ROTATOR CUFF REPAIR     Social History   Socioeconomic History  . Marital status: Divorced    Spouse name: Not on file  . Number of children: 4  . Years of education: 12+  . Highest education level: Not on file  Occupational History  . Occupation: [employer]  Social Needs  . Financial resource strain: Not on file  . Food insecurity:    Worry: Not on file    Inability: Not on file  . Transportation needs:    Medical: Not on file    Non-medical: Not on file  Tobacco Use  . Smoking status: Never Smoker  . Smokeless tobacco: Never Used  . Tobacco comment: never used tobacco  Substance and Sexual Activity  . Alcohol use: No    Alcohol/week: 0.0 standard drinks  . Drug use: No  . Sexual activity: Not on file  Lifestyle  . Physical activity:    Days per week: Not on file    Minutes per session: Not on file  . Stress: Not on file  Relationships  . Social connections:    Talks on phone: Not on file    Gets together: Not on file    Attends religious service: Not on file    Active member of club or organization: Not on file    Attends meetings of clubs or organizations: Not on file    Relationship status: Not on file  . Intimate partner violence:    Fear of current or ex partner: Not on file    Emotionally abused: Not on file    Physically abused: Not on file    Forced sexual activity: Not on file  Other Topics Concern  . Not on file  Social History Narrative   Lives at home with herself.   Caffeine use: Drinks coffee and tea (1 cup coffee per day). Tea rarely     Current Outpatient Medications on File Prior to Visit  Medication Sig Dispense Refill  . amLODipine (NORVASC) 5 MG tablet Take 5 mg by mouth Daily.    Marland Kitchen aspirin 81 MG tablet Take 81 mg by mouth daily.    . beta carotene w/minerals (OCUVITE) tablet Take 1 tablet by mouth daily.    . cetirizine (ZYRTEC) 10 MG tablet Take 20 mg by mouth daily.  1  . Cholecalciferol (VITAMIN D-3 PO) Take 2,000 Units by mouth daily.    . fenofibrate 54 MG tablet 54 mg daily.    Marland Kitchen  fexofenadine (ALLEGRA) 180 MG tablet Take 1 tablet (180 mg total) by mouth 2 (two) times daily. 60 tablet 3  . FLUoxetine (PROZAC) 20 MG capsule Take 20 mg by mouth Daily.    . Garlic Oil 6222 MG CAPS Take 1,000 mg by mouth daily.     . Glucosamine HCl 1500 MG TABS Take 1,500 mg by mouth daily.     . hydrOXYzine (ATARAX/VISTARIL) 25 MG tablet Take 25 mg by mouth at bedtime.  5  . levothyroxine (SYNTHROID, LEVOTHROID) 112 MCG tablet Take 112 mcg by mouth daily before breakfast.     . metFORMIN (GLUCOPHAGE) 1000 MG tablet Take 1 tablet (1,000 mg total) by mouth 2 (two) times daily with a meal. (Patient taking differently: Take 1,000 mg daily by mouth. ) 180 tablet 3  . montelukast (SINGULAIR) 10 MG tablet Take 1 tablet (10 mg total) by mouth at bedtime. 30 tablet 3  . Multiple Vitamin (MULTIVITAMIN) capsule Take 1 capsule by mouth daily.    Glory Rosebush DELICA LANCETS 97L MISC USE ONE TIME A DAY 100 each 11  . ONETOUCH VERIO test strip USE TO CHECK ONE TIME A DAY 50 each 23  . polyvinyl alcohol (LIQUIFILM TEARS) 1.4 % ophthalmic solution Place 1 drop into both eyes as needed for dry eyes.    . Probiotic Product (PROBIOTIC DAILY) CAPS Take 1 capsule by mouth every morning.     . ranitidine (ZANTAC) 150 MG tablet Take 1 tablet (150 mg total) by mouth 2 (two) times daily. 60 tablet 2  . triamterene-hydrochlorothiazide (DYAZIDE) 37.5-25 MG per capsule Take 1 capsule by mouth Daily.     . Turmeric 500 MG TABS Take 500 mg by mouth daily.       No current facility-administered medications on file prior to visit.    Allergies  Allergen Reactions  . Statins     Aches in her bones   Family History  Problem Relation Age of Onset  . Migraines Neg Hx    PE: BP (!) 160/80   Pulse 67   Ht 5\' 1"  (1.549 m) Comment: measured  Wt 153 lb (69.4 kg)   SpO2 98%   BMI 28.91 kg/m  Body mass index is 28.91 kg/m. Wt Readings from Last 3 Encounters:  08/12/18 153 lb (69.4 kg)  07/22/18 158 lb (71.7 kg)  05/20/18 153 lb (69.4 kg)   Constitutional: overweight, in NAD Eyes: PERRLA, EOMI, no exophthalmos ENT: moist mucous membranes, + left thyroid lobe slightly more prominent on palpation, no cervical lymphadenopathy Cardiovascular: RRR, No MRG Respiratory: CTA B Gastrointestinal: abdomen soft, NT, ND, BS+ Musculoskeletal: +finger joint deformities (Heber[place] and Bouchard nodules), strength intact in all 4 Skin: moist, warm, no rashes Neurological: no tremor with outstretched hands, DTR normal in all 4  ASSESSMENT: 1. DM2, non-insulin-dependent, controlled, with long-term complications -Mild CKD  2. HL  3.  Enlarged thyroid - L  PLAN:  1. Patient with history of controlled diabetes, on oral antidiabetic regimen with half maximal metformin dose only.  We cannot use a maximal dose of metformin due to diarrhea.  At last visit, HbA1c was slightly higher, at 6.7%, increased from 6.5%.  At last visit, she was checking sugars inconsistently and I advised her to check more frequently in a different times of the day.  We did not change her regimen then. - At this visit, sugars are mostly at goal with the exception of a 218 glucose while on steroids.  This morning her CBG was higher  than target at 144.  She is not sure why this happened.  The rest of the sugars are at goal.  We do not need to change the regimen for now.  She is tolerating well this dose of metformin. - I suggested to:  Patient Instructions  Please continue metformin 1000 mg  with dinner  Please return in 6 months with your sugar log.   - today, HbA1c is 6.4% (better) - continue checking sugars at different times of the day - check 1x a day, rotating checks - advised for yearly eye exams >> she is UTD - Return to clinic in 6 mo with sugar log     2. HL - Reviewed latest lipid panel from 09/2016: Total cholesterol and triglycerides were high, worse Lab Results  Component Value Date   CHOL 208 (H) 11/01/2015   HDL 40 11/01/2015   LDLCALC 131 (H) 11/01/2015   TRIG 183 (H) 11/01/2015   CHOLHDL 5.2 (H) 11/01/2015  - Intolerant to statins, but she is on fenofibrate, and tolerates this well   3.  Enlarged thyroid -left -Left thyroid lobe is more prominent on palpation -Denies any neck compression symptoms -She was recently found to have positive ATA antibodies and has a normal TSH.  Most likely diagnosis is euthyroid Hashimoto's thyroiditis. -We will continue to follow her clinically  Philemon Kingdom, MD PhD [facility] Endocrinology

## 2018-08-12 NOTE — Patient Instructions (Signed)
Please continue metformin 1000 mg with dinner  Please return in 6 months with your sugar log.

## 2018-08-12 NOTE — Addendum Note (Signed)
Addended by: Cardell Peach I on: 08/12/2018 11:33 AM   Modules accepted: Orders

## 2018-08-13 ENCOUNTER — Other Ambulatory Visit: Payer: Self-pay | Admitting: Family Medicine

## 2018-08-13 DIAGNOSIS — Z1231 Encounter for screening mammogram for malignant neoplasm of breast: Secondary | ICD-10-CM

## 2018-08-23 ENCOUNTER — Ambulatory Visit: Payer: Medicare Other | Admitting: Hematology & Oncology

## 2018-08-23 ENCOUNTER — Other Ambulatory Visit: Payer: Medicare Other

## 2018-08-30 ENCOUNTER — Encounter: Payer: Self-pay | Admitting: Hematology & Oncology

## 2018-08-30 ENCOUNTER — Other Ambulatory Visit: Payer: Self-pay

## 2018-08-30 ENCOUNTER — Inpatient Hospital Stay: Payer: Medicare Other | Attending: Hematology & Oncology

## 2018-08-30 ENCOUNTER — Inpatient Hospital Stay (HOSPITAL_BASED_OUTPATIENT_CLINIC_OR_DEPARTMENT_OTHER): Payer: Medicare Other | Admitting: Hematology & Oncology

## 2018-08-30 VITALS — BP 138/38 | HR 68 | Temp 97.9°F | Resp 16 | Wt 154.4 lb

## 2018-08-30 DIAGNOSIS — Z7982 Long term (current) use of aspirin: Secondary | ICD-10-CM

## 2018-08-30 DIAGNOSIS — Z9221 Personal history of antineoplastic chemotherapy: Secondary | ICD-10-CM

## 2018-08-30 DIAGNOSIS — Z7984 Long term (current) use of oral hypoglycemic drugs: Secondary | ICD-10-CM

## 2018-08-30 DIAGNOSIS — Z85038 Personal history of other malignant neoplasm of large intestine: Secondary | ICD-10-CM

## 2018-08-30 DIAGNOSIS — D4701 Cutaneous mastocytosis: Secondary | ICD-10-CM

## 2018-08-30 DIAGNOSIS — Z79899 Other long term (current) drug therapy: Secondary | ICD-10-CM

## 2018-08-30 DIAGNOSIS — C184 Malignant neoplasm of transverse colon: Secondary | ICD-10-CM

## 2018-08-30 LAB — CMP (CANCER CENTER ONLY)
ALT: 26 U/L (ref 0–44)
AST: 18 U/L (ref 15–41)
Albumin: 4.4 g/dL (ref 3.5–5.0)
Alkaline Phosphatase: 48 U/L (ref 38–126)
Anion gap: 8 (ref 5–15)
BILIRUBIN TOTAL: 0.4 mg/dL (ref 0.3–1.2)
BUN: 15 mg/dL (ref 8–23)
CO2: 30 mmol/L (ref 22–32)
Calcium: 10 mg/dL (ref 8.9–10.3)
Chloride: 103 mmol/L (ref 98–111)
Creatinine: 0.95 mg/dL (ref 0.44–1.00)
GFR, EST NON AFRICAN AMERICAN: 57 mL/min — AB (ref >60)
GFR, Est AFR Am: >60 mL/min (ref >60)
Glucose, Bld: 110 mg/dL — ABNORMAL HIGH (ref 70–99)
Potassium: 4 mmol/L (ref 3.5–5.1)
Sodium: 141 mmol/L (ref 135–145)
Total Protein: 6.5 g/dL (ref 6.5–8.1)

## 2018-08-30 LAB — CBC WITH DIFFERENTIAL (CANCER CENTER ONLY)
ABS IMMATURE GRANULOCYTES: 0.03 10*3/uL (ref 0.00–0.07)
BASOS ABS: 0 10*3/uL (ref 0.0–0.1)
Basophils Relative: 0 %
Eosinophils Absolute: 0.6 10*3/uL — ABNORMAL HIGH (ref 0.0–0.5)
Eosinophils Relative: 8 %
HCT: 36.2 % (ref 36.0–46.0)
Hemoglobin: 11.8 g/dL — ABNORMAL LOW (ref 12.0–15.0)
Immature Granulocytes: 0 %
LYMPHS ABS: 2.6 10*3/uL (ref 0.7–4.0)
LYMPHS PCT: 35 %
MCH: 29.1 pg (ref 26.0–34.0)
MCHC: 32.6 g/dL (ref 30.0–36.0)
MCV: 89.4 fL (ref 80.0–100.0)
MONO ABS: 0.6 10*3/uL (ref 0.1–1.0)
Monocytes Relative: 8 %
NEUTROS ABS: 3.6 10*3/uL (ref 1.7–7.7)
NRBC: 0 % (ref 0.0–0.2)
Neutrophils Relative %: 49 %
PLATELETS: 207 10*3/uL (ref 150–400)
RBC: 4.05 MIL/uL (ref 3.87–5.11)
RDW: 12.2 % (ref 11.5–15.5)
WBC: 7.5 10*3/uL (ref 4.0–10.5)

## 2018-08-30 NOTE — Progress Notes (Signed)
Hematology and Oncology Follow Up Visit  Robin [name] [id number] 1940/11/20 77 y.o. 08/30/2018   Principle Diagnosis:  History of locally recurrent adenocarcinoma of the colon  Current Therapy:   Observation    Interim History:  Robin [name] is here today for follow-up.  Last time that we saw her, there was a distinct possibility that she may have mastocytosis.  We went ahead and embark upon a work-up.  The work-up was initiated because she was having a rash and was having pruritus.  She was found to have an elevated tryptase level of 27.  This was in June.  When we saw her in October, the tryptase was 21.  She had a very low IgE level.  We then did a bone marrow biopsy on her.  This was done on 08/01/2018.  The bone marrow report ([id number]) did not show any evidence of mast cell infiltration.  Her flow cytometry was negative for any mast cells.  We did a c-kit analysis is also was normal.  She found out that for some reason, she is now "allergic" to red meat.  Whenever she has before pork, she develops pruritus.  I do not see any evidence that she has systemic mastocytosis.  Of note, on the bone marrow biopsy, the cytogenetics were normal.  She is feeling much better now.  Again, she is not eating red meat.  There is no new medications that she is on.  She has not had any issues with wheezing.  There is no cough.  She has had no diarrhea.  Overall, her performance status is ECOG 1.  Medications:  Allergies as of 08/30/2018      Reactions   Statins    Aches in her bones      Medication List        Accurate as of 08/30/18  2:45 PM. Always use your most recent med list.          amLODipine 5 MG tablet Commonly known as:  NORVASC Take 5 mg by mouth Daily.   aspirin 81 MG tablet Take 81 mg by mouth daily.   beta carotene w/minerals tablet Take 1 tablet by mouth daily.   cetirizine 10 MG tablet Commonly known as:  ZYRTEC Take 20 mg by mouth daily.     fenofibrate 54 MG tablet 54 mg daily.   fexofenadine 180 MG tablet Commonly known as:  ALLEGRA Take 1 tablet (180 mg total) by mouth 2 (two) times daily.   FLUoxetine 20 MG capsule Commonly known as:  PROZAC Take 20 mg by mouth Daily.   Garlic Oil 2707 MG Caps Take 1,000 mg by mouth daily.   Glucosamine HCl 1500 MG Tabs Take 1,500 mg by mouth daily.   hydrOXYzine 25 MG tablet Commonly known as:  ATARAX/VISTARIL Take 25 mg by mouth at bedtime.   levothyroxine 112 MCG tablet Commonly known as:  SYNTHROID, LEVOTHROID Take 112 mcg by mouth daily before breakfast.   metFORMIN 1000 MG tablet Commonly known as:  GLUCOPHAGE Take 1 tablet (1,000 mg total) by mouth 2 (two) times daily with a meal.   montelukast 10 MG tablet Commonly known as:  SINGULAIR Take 1 tablet (10 mg total) by mouth at bedtime.   multivitamin capsule Take 1 capsule by mouth daily.   omeprazole 20 MG capsule Commonly known as:  PRILOSEC Take 20 mg by mouth daily as needed.   ONETOUCH DELICA LANCETS 86L Misc USE ONE TIME A DAY   ONETOUCH VERIO test  strip Generic drug:  glucose blood USE TO CHECK ONE TIME A DAY   polyvinyl alcohol 1.4 % ophthalmic solution Commonly known as:  LIQUIFILM TEARS Place 1 drop into both eyes as needed for dry eyes.   PROBIOTIC DAILY Caps Take 1 capsule by mouth every morning.   triamterene-hydrochlorothiazide 37.5-25 MG capsule Commonly known as:  DYAZIDE Take 1 capsule by mouth Daily.   Turmeric 500 MG Tabs Take 500 mg by mouth daily.   VITAMIN D-3 PO Take 2,000 Units by mouth daily.       Allergies:  Allergies  Allergen Reactions  . Statins     Aches in her bones    Past Medical History, Surgical history, Social history, and Family History were reviewed and updated.  Review of Systems: Review of Systems  Constitutional: Negative.   HENT: Negative.   Eyes: Negative.   Respiratory: Negative.   Cardiovascular: Negative.   Gastrointestinal:  Negative.   Genitourinary: Negative.   Musculoskeletal: Negative.   Skin: Negative.   Endo/Heme/Allergies: Negative.   Psychiatric/Behavioral: Negative.     Physical Exam:  weight is 154 lb 6.4 oz (70 kg). Her oral temperature is 97.9 F (36.6 C). Her blood pressure is 138/38 (abnormal) and her pulse is 68. Her respiration is 16 and oxygen saturation is 98%.   Wt Readings from Last 3 Encounters:  08/30/18 154 lb 6.4 oz (70 kg)  08/12/18 153 lb (69.4 kg)  07/22/18 158 lb (71.7 kg)    Physical Exam  Constitutional: She is oriented to person, place, and time.  HENT:  Head: Normocephalic and atraumatic.  Mouth/Throat: Oropharynx is clear and moist.  Eyes: Pupils are equal, round, and reactive to light. EOM are normal.  Neck: Normal range of motion.  Cardiovascular: Normal rate, regular rhythm and normal heart sounds.  Pulmonary/Chest: Effort normal and breath sounds normal.  Abdominal: Soft. Bowel sounds are normal.  Musculoskeletal: Normal range of motion. She exhibits no edema, tenderness or deformity.  Lymphadenopathy:    She has no cervical adenopathy.  Neurological: She is alert and oriented to person, place, and time.  Skin: Skin is warm and dry. No rash noted. No erythema.  She has multiple areas of urticarial lesions.  They are erythematous macular lesions.  There are mostly on her anterior abdominal wall.  Psychiatric: She has a normal mood and affect. Her behavior is normal. Judgment and thought content normal.  Vitals reviewed.    Lab Results  Component Value Date   WBC 7.5 08/30/2018   HGB 11.8 (L) 08/30/2018   HCT 36.2 08/30/2018   MCV 89.4 08/30/2018   PLT 207 08/30/2018   Lab Results  Component Value Date   FERRITIN 250 10/14/2009   Lab Results  Component Value Date   RETICCTPCT 1.2 12/17/2008   RBC 4.05 08/30/2018   RETICCTABS 48.7 12/17/2008   No results found for: KPAFRELGTCHN, LAMBDASER, KAPLAMBRATIO No results found for: IGGSERUM, IGA,  IGMSERUM No results found for: Odetta Pink, SPEI   Chemistry      Component Value Date/Time   NA 141 08/30/2018 1332   NA 142 03/22/2018 1059   NA 139 05/21/2017 0838   K 4.0 08/30/2018 1332   K 4.3 05/21/2017 0838   CL 103 08/30/2018 1332   CL 102 05/18/2016 1505   CL 100 11/01/2015 0932   CO2 30 08/30/2018 1332   CO2 28 05/21/2017 0838   BUN 15 08/30/2018 1332   BUN 13 03/22/2018 1059  BUN 20.8 05/21/2017 0838   CREATININE 0.95 08/30/2018 1332   CREATININE 1.0 05/21/2017 0838      Component Value Date/Time   CALCIUM 10.0 08/30/2018 1332   CALCIUM 10.5 (H) 05/21/2017 0838   ALKPHOS 48 08/30/2018 1332   ALKPHOS 49 05/21/2017 0838   AST 18 08/30/2018 1332   AST 22 05/21/2017 0838   ALT 26 08/30/2018 1332   ALT 33 05/21/2017 0838   BILITOT 0.4 08/30/2018 1332   BILITOT 0.57 05/21/2017 0838      Impression and Plan: MS. Yates is a very pleasant 77 yo caucasian female with history of locally recurrent adenocarcinoma of the colon with resection in 2000. She received adjuvant FOLFOX. She is doing well and has no complaints at this time.   At this point, I think we can get her back on a regular schedule.  I would like to see her back in the springtime.  We will get her back in April.  Again, I am glad that we have not found cutaneous mastocytosis nor systemic mastocytosis.  I am still not sure as to why she is "allergic" to red meat.  [name], MD 12/6/20192:45 PM

## 2018-09-11 ENCOUNTER — Other Ambulatory Visit: Payer: Self-pay | Admitting: Internal Medicine

## 2018-09-11 DIAGNOSIS — E119 Type 2 diabetes mellitus without complications: Secondary | ICD-10-CM

## 2018-09-27 ENCOUNTER — Ambulatory Visit: Payer: Medicare Other

## 2018-10-01 ENCOUNTER — Ambulatory Visit
Admission: RE | Admit: 2018-10-01 | Discharge: 2018-10-01 | Disposition: A | Discharge status: Home / Self Care | Payer: Medicare Other | Source: Ambulatory Visit | Attending: Family Medicine | Admitting: Family Medicine

## 2018-10-01 DIAGNOSIS — Z1231 Encounter for screening mammogram for malignant neoplasm of breast: Secondary | ICD-10-CM

## 2018-10-25 ENCOUNTER — Other Ambulatory Visit: Payer: Self-pay | Admitting: Family Medicine

## 2018-10-25 DIAGNOSIS — M8580 Other specified disorders of bone density and structure, unspecified site: Secondary | ICD-10-CM

## 2018-12-30 ENCOUNTER — Encounter: Payer: Self-pay | Admitting: Hematology & Oncology

## 2018-12-30 ENCOUNTER — Other Ambulatory Visit: Payer: Medicare Other

## 2018-12-30 ENCOUNTER — Inpatient Hospital Stay: Payer: Medicare Other | Attending: Hematology & Oncology

## 2018-12-30 ENCOUNTER — Inpatient Hospital Stay (HOSPITAL_BASED_OUTPATIENT_CLINIC_OR_DEPARTMENT_OTHER): Payer: Medicare Other | Admitting: Hematology & Oncology

## 2018-12-30 ENCOUNTER — Other Ambulatory Visit: Payer: Self-pay

## 2018-12-30 ENCOUNTER — Ambulatory Visit: Payer: Medicare Other | Admitting: Hematology & Oncology

## 2018-12-30 VITALS — BP 144/66 | HR 60 | Temp 98.4°F | Resp 18 | Wt 154.0 lb

## 2018-12-30 DIAGNOSIS — C184 Malignant neoplasm of transverse colon: Secondary | ICD-10-CM

## 2018-12-30 DIAGNOSIS — Z85038 Personal history of other malignant neoplasm of large intestine: Secondary | ICD-10-CM | POA: Insufficient documentation

## 2018-12-30 DIAGNOSIS — R1110 Vomiting, unspecified: Secondary | ICD-10-CM | POA: Insufficient documentation

## 2018-12-30 DIAGNOSIS — D4701 Cutaneous mastocytosis: Secondary | ICD-10-CM

## 2018-12-30 LAB — CBC WITH DIFFERENTIAL (CANCER CENTER ONLY)
Abs Immature Granulocytes: 0.01 10*3/uL (ref 0.00–0.07)
Basophils Absolute: 0 10*3/uL (ref 0.0–0.1)
Basophils Relative: 0 %
Eosinophils Absolute: 0.4 10*3/uL (ref 0.0–0.5)
Eosinophils Relative: 6 %
HCT: 38.8 % (ref 36.0–46.0)
Hemoglobin: 12.8 g/dL (ref 12.0–15.0)
Immature Granulocytes: 0 %
Lymphocytes Relative: 46 %
Lymphs Abs: 2.6 10*3/uL (ref 0.7–4.0)
MCH: 29.4 pg (ref 26.0–34.0)
MCHC: 33 g/dL (ref 30.0–36.0)
MCV: 89 fL (ref 80.0–100.0)
Monocytes Absolute: 0.5 10*3/uL (ref 0.1–1.0)
Monocytes Relative: 9 %
Neutro Abs: 2.2 10*3/uL (ref 1.7–7.7)
Neutrophils Relative %: 39 %
Platelet Count: 184 10*3/uL (ref 150–400)
RBC: 4.36 MIL/uL (ref 3.87–5.11)
RDW: 12.7 % (ref 11.5–15.5)
WBC Count: 5.6 10*3/uL (ref 4.0–10.5)
nRBC: 0 % (ref 0.0–0.2)

## 2018-12-30 LAB — CMP (CANCER CENTER ONLY)
ALT: 31 U/L (ref 0–44)
AST: 19 U/L (ref 15–41)
Albumin: 4.7 g/dL (ref 3.5–5.0)
Alkaline Phosphatase: 42 U/L (ref 38–126)
Anion gap: 10 (ref 5–15)
BUN: 23 mg/dL (ref 8–23)
CO2: 30 mmol/L (ref 22–32)
Calcium: 10.4 mg/dL — ABNORMAL HIGH (ref 8.9–10.3)
Chloride: 99 mmol/L (ref 98–111)
Creatinine: 1.07 mg/dL — ABNORMAL HIGH (ref 0.44–1.00)
GFR, Est AFR Am: 58 mL/min — ABNORMAL LOW (ref >60)
GFR, Estimated: 50 mL/min — ABNORMAL LOW (ref >60)
Glucose, Bld: 144 mg/dL — ABNORMAL HIGH (ref 70–99)
Potassium: 4.4 mmol/L (ref 3.5–5.1)
Sodium: 139 mmol/L (ref 135–145)
Total Bilirubin: 0.6 mg/dL (ref 0.3–1.2)
Total Protein: 7.1 g/dL (ref 6.5–8.1)

## 2018-12-30 LAB — CEA (IN HOUSE-CHCC): CEA (CHCC-In House): 2.69 ng/mL (ref 0.00–5.00)

## 2018-12-30 MED ORDER — METRONIDAZOLE 500 MG PO TABS: 500.0000 mg | ORAL_TABLET | Freq: Three times a day (TID) | ORAL | 0 refills | Status: DC

## 2018-12-30 MED ORDER — CIPROFLOXACIN HCL 500 MG PO TABS: 500.0000 mg | ORAL_TABLET | Freq: Two times a day (BID) | ORAL | 0 refills | Status: DC

## 2018-12-30 NOTE — Progress Notes (Signed)
Hematology and Oncology Follow Up Visit  Robin [name] [id number] 1940-12-25 78 y.o. 12/30/2018   Principle Diagnosis:  History of locally recurrent adenocarcinoma of the colon  Current Therapy:   Observation    Interim History:  Robin [name] is here today for follow-up.  She now is having some problems with her abdomen.  She did have a lot of abdominal "gurgling".  This is been going on for a few weeks.  She is had pain in the left lower quadrant.  She has had some occasional loose stool.  There is been no fever.  She really has not been taking much for it.  It sounds like diverticulitis.  As such, I went ahead and was a "proactive" and put her on Flagyl and Ciprofloxin for 10 days.  I thought maybe this would help her.   She does have a gastroenterologist.  Hopefully she will not have to see him or her.  There was an issue with her possibly having mastocytosis.  We did a thorough work-up on her last fall.  Everything came back negative.  She had a bone marrow biopsy which was unremarkable.  She did have a nice Christmas.  She is quite sad that she will not be able to have [place] service because of the coronavirus restrictions.  There is been an episode of vomiting.  She says she vomited 4 x 1 day.  I did tell her to try some Zegerid.  This is over-the-counter.  This also might help.  Hopefully she will not need any type of endoscopy.  There is been no cough.  She has had no urinary issues.  Overall, performed status is ECOG 1.   Medications:  Allergies as of 12/30/2018      Reactions   Statins    Aches in her bones      Medication List       Accurate as of December 30, 2018 10:57 AM. Always use your most recent med list.        amLODipine 5 MG tablet Commonly known as:  NORVASC Take 5 mg by mouth Daily.   aspirin 81 MG tablet Take 81 mg by mouth daily.   beta carotene w/minerals tablet Take 1 tablet by mouth daily.   cetirizine 10 MG tablet Commonly known as:   ZYRTEC Take 20 mg by mouth daily.   fenofibrate 54 MG tablet 54 mg daily.   fexofenadine 180 MG tablet Commonly known as:  ALLEGRA Take 1 tablet (180 mg total) by mouth 2 (two) times daily.   FLUoxetine 20 MG capsule Commonly known as:  PROZAC Take 20 mg by mouth Daily.   fluticasone 50 MCG/ACT nasal spray Commonly known as:  FLONASE Place 1 spray into both nostrils daily.   Glucosamine HCl 1500 MG Tabs Take 1,500 mg by mouth daily.   hydrOXYzine 25 MG tablet Commonly known as:  ATARAX/VISTARIL Take 25 mg by mouth at bedtime.   levocetirizine 5 MG tablet Commonly known as:  XYZAL Take 5 mg by mouth daily. Take 2 tablets by mouth once daily in the morning.   levothyroxine 112 MCG tablet Commonly known as:  SYNTHROID, LEVOTHROID Take 112 mcg by mouth daily before breakfast.   metFORMIN 1000 MG tablet Commonly known as:  GLUCOPHAGE Take 1,000 mg by mouth daily with breakfast.   multivitamin capsule Take 1 capsule by mouth daily.   omeprazole 20 MG capsule Commonly known as:  PRILOSEC Take 20 mg by mouth daily as needed.  polyvinyl alcohol 1.4 % ophthalmic solution Commonly known as:  LIQUIFILM TEARS Place 1 drop into both eyes as needed for dry eyes.   Probiotic Daily Caps Take 1 capsule by mouth every morning.   triamterene-hydrochlorothiazide 37.5-25 MG capsule Commonly known as:  DYAZIDE Take 1 capsule by mouth Daily.   VITAMIN D-3 PO Take 2,000 Units by mouth daily.       Allergies:  Allergies  Allergen Reactions  . Statins     Aches in her bones    Past Medical History, Surgical history, Social history, and Family History were reviewed and updated.  Review of Systems: Review of Systems  Constitutional: Negative.   HENT: Negative.   Eyes: Negative.   Respiratory: Negative.   Cardiovascular: Negative.   Gastrointestinal: Negative.   Genitourinary: Negative.   Musculoskeletal: Negative.   Skin: Negative.   Endo/Heme/Allergies: Negative.    Psychiatric/Behavioral: Negative.     Physical Exam:  weight is 154 lb (69.9 kg). Her oral temperature is 98.4 F (36.9 C). Her blood pressure is 144/66 (abnormal) and her pulse is 60. Her respiration is 18 and oxygen saturation is 98%.   Wt Readings from Last 3 Encounters:  12/30/18 154 lb (69.9 kg)  08/30/18 154 lb 6.4 oz (70 kg)  08/12/18 153 lb (69.4 kg)    Physical Exam Vitals signs reviewed.  HENT:     Head: Normocephalic and atraumatic.  Eyes:     Pupils: Pupils are equal, round, and reactive to light.  Neck:     Musculoskeletal: Normal range of motion.  Cardiovascular:     Rate and Rhythm: Normal rate and regular rhythm.     Heart sounds: Normal heart sounds.  Pulmonary:     Effort: Pulmonary effort is normal.     Breath sounds: Normal breath sounds.  Abdominal:     General: Bowel sounds are normal.     Palpations: Abdomen is soft.  Musculoskeletal: Normal range of motion.        General: No tenderness or deformity.  Lymphadenopathy:     Cervical: No cervical adenopathy.  Skin:    General: Skin is warm and dry.     Findings: No erythema or rash.     Comments: She has multiple areas of urticarial lesions.  They are erythematous macular lesions.  There are mostly on her anterior abdominal wall.  Neurological:     Mental Status: She is alert and oriented to person, place, and time.  Psychiatric:        Behavior: Behavior normal.        Thought Content: Thought content normal.        Judgment: Judgment normal.      Lab Results  Component Value Date   WBC 5.6 12/30/2018   HGB 12.8 12/30/2018   HCT 38.8 12/30/2018   MCV 89.0 12/30/2018   PLT 184 12/30/2018   Lab Results  Component Value Date   FERRITIN 250 10/14/2009   Lab Results  Component Value Date   RETICCTPCT 1.2 12/17/2008   RBC 4.36 12/30/2018   RETICCTABS 48.7 12/17/2008   No results found for: KPAFRELGTCHN, LAMBDASER, KAPLAMBRATIO No results found for: IGGSERUM, IGA, IGMSERUM No  results found for: Odetta Pink, SPEI   Chemistry      Component Value Date/Time   NA 139 12/30/2018 0931   NA 142 03/22/2018 1059   NA 139 05/21/2017 0838   K 4.4 12/30/2018 0931   K 4.3 05/21/2017 6067  CL 99 12/30/2018 0931   CL 102 05/18/2016 1505   CL 100 11/01/2015 0932   CO2 30 12/30/2018 0931   CO2 28 05/21/2017 0838   BUN 23 12/30/2018 0931   BUN 13 03/22/2018 1059   BUN 20.8 05/21/2017 0838   CREATININE 1.07 (H) 12/30/2018 0931   CREATININE 1.0 05/21/2017 0838      Component Value Date/Time   CALCIUM 10.4 (H) 12/30/2018 0931   CALCIUM 10.5 (H) 05/21/2017 0838   ALKPHOS 42 12/30/2018 0931   ALKPHOS 49 05/21/2017 0838   AST 19 12/30/2018 0931   AST 22 05/21/2017 0838   ALT 31 12/30/2018 0931   ALT 33 05/21/2017 0838   BILITOT 0.6 12/30/2018 0931   BILITOT 0.57 05/21/2017 0838      Impression and Plan: Robin [name] is a very pleasant 78 yo caucasian female with history of locally recurrent adenocarcinoma of the colon with resection in 2000. She received adjuvant FOLFOX. She is doing well and has no complaints at this time.   I forgot to mention that she stopped eating red meat.  This had helped her in the past.  Hopefully, the antibiotics will help with this abdominal issue.  I would still like to see her back in 4 months.  [name], MD 4/6/202010:57 AM

## 2018-12-31 ENCOUNTER — Telehealth: Payer: Self-pay | Admitting: Hematology & Oncology

## 2018-12-31 NOTE — Telephone Encounter (Signed)
Appointments scheduled letter/calendar mailed per 4/6 los

## 2019-01-01 LAB — TRYPTASE: Tryptase: 23.4 ug/L — ABNORMAL HIGH (ref 2.2–13.2)

## 2019-01-01 LAB — IGE: IgE (Immunoglobulin E), Serum: <2 IU/mL — ABNORMAL LOW (ref 6–495)

## 2019-01-03 ENCOUNTER — Other Ambulatory Visit: Payer: Medicare Other

## 2019-01-14 ENCOUNTER — Other Ambulatory Visit: Payer: Self-pay | Admitting: Gastroenterology

## 2019-01-14 DIAGNOSIS — R109 Unspecified abdominal pain: Secondary | ICD-10-CM

## 2019-01-14 DIAGNOSIS — R110 Nausea: Secondary | ICD-10-CM

## 2019-01-17 ENCOUNTER — Other Ambulatory Visit: Payer: Self-pay

## 2019-01-17 ENCOUNTER — Ambulatory Visit
Admission: RE | Admit: 2019-01-17 | Discharge: 2019-01-17 | Disposition: A | Discharge status: Home / Self Care | Payer: Medicare Other | Source: Ambulatory Visit | Attending: Gastroenterology | Admitting: Gastroenterology

## 2019-01-17 DIAGNOSIS — R110 Nausea: Secondary | ICD-10-CM

## 2019-01-17 DIAGNOSIS — R109 Unspecified abdominal pain: Secondary | ICD-10-CM

## 2019-01-17 MED ORDER — IOPAMIDOL (ISOVUE-300) INJECTION 61%
100.0000 mL | Freq: Once | INTRAVENOUS | Status: AC | PRN
  Administered 2019-01-17: 100 mL via INTRAVENOUS

## 2019-01-29 ENCOUNTER — Telehealth: Payer: Self-pay | Admitting: Internal Medicine

## 2019-01-29 NOTE — Telephone Encounter (Signed)
MEDICATION: metFORMIN (GLUCOPHAGE) 1000 MG tablet  PHARMACY:  [facility]  IS THIS A 90 DAY SUPPLY :   IS PATIENT OUT OF MEDICATION:   IF NOT; HOW MUCH IS LEFT:   LAST APPOINTMENT DATE: @12 /18/2019  NEXT APPOINTMENT DATE:@5 /19/2020  DO WE HAVE YOUR PERMISSION TO LEAVE A DETAILED MESSAGE:  OTHER COMMENTS:  [facility]ron called stating that patient is having trouble with the dosage at her pharmacy. Stated the pharmacy has take two on record.  **Let patient know to contact pharmacy at the end of the day to make sure medication is ready. **  ** Please notify patient to allow 48-72 hours to process**  **Encourage patient to contact the pharmacy for refills or they can request refills through [facility]**

## 2019-01-30 MED ORDER — METFORMIN HCL 1000 MG PO TABS: 1000.0000 mg | ORAL_TABLET | Freq: Every day | ORAL | 0 refills | Status: DC

## 2019-01-30 NOTE — Telephone Encounter (Signed)
RX sent

## 2019-02-10 ENCOUNTER — Other Ambulatory Visit: Payer: Self-pay

## 2019-02-11 ENCOUNTER — Encounter: Payer: Self-pay | Admitting: Internal Medicine

## 2019-02-11 ENCOUNTER — Ambulatory Visit: Payer: Medicare Other | Admitting: Internal Medicine

## 2019-02-11 VITALS — BP 158/70 | HR 61 | Temp 98.1°F | Ht 61.0 in | Wt 156.0 lb

## 2019-02-11 DIAGNOSIS — E1169 Type 2 diabetes mellitus with other specified complication: Secondary | ICD-10-CM | POA: Diagnosis not present

## 2019-02-11 DIAGNOSIS — E049 Nontoxic goiter, unspecified: Secondary | ICD-10-CM | POA: Diagnosis not present

## 2019-02-11 DIAGNOSIS — E1121 Type 2 diabetes mellitus with diabetic nephropathy: Secondary | ICD-10-CM | POA: Diagnosis not present

## 2019-02-11 DIAGNOSIS — E785 Hyperlipidemia, unspecified: Secondary | ICD-10-CM | POA: Diagnosis not present

## 2019-02-11 LAB — POCT GLYCOSYLATED HEMOGLOBIN (HGB A1C): Hemoglobin A1C: 6.5 % — AB (ref 4.0–5.6)

## 2019-02-11 NOTE — Addendum Note (Signed)
Addended by: Cardell Peach I on: 02/11/2019 10:08 AM   Modules accepted: Orders

## 2019-02-11 NOTE — Progress Notes (Addendum)
Patient ID: Robin [name], female   DOB: May 26, 1941, 78 y.o.   MRN: [id number]  HPI: Robin [name] is a 78 y.o.-year-old female, returning for f/u for DM2, initially GDM, then DM2 dx in 05/2015, insulin-independent, controlled, with complications (mild CKD). Last visit 6 months ago.  Before last visit, she had a pruritic maculopapular generalized rash. She saw PCP and dermatology and no etiology was found.she then saw Dr. Marin Olp - suspicion for mastocytosis  - BM Bx negative.  She found out she is allergic to meat.  She stopped eating meat and the rash resolved.  Last hemoglobin A1c: Lab Results  Component Value Date   HGBA1C 6.4 (A) 08/12/2018   HGBA1C 6.7 01/25/2018   HGBA1C 6.5% 08/06/2017   Pt is on a regimen of: - Metformin 1000 mg with dinner >> 1000 mg 2x a day >> now 1000 mg daily with b'fast  - because of diarrhea  Pt checks her sugars 1x a day- per download of her meter, - am:156 >> n/c >> 120-130 >> 144 >> n/c - 2h after b'fast: n/c - before lunch: 92 >> n/c >> 82-120, 218 >> n/c - 2h after lunch: 183-105, 154 >> n/c - before dinner: 87-106, 116 >> 91, 104-146, 186 - 2h after dinner:  83 >> 120-130 >> n/c - bedtime: 115, 116 >> 94  >> n/c - nighttime: n/c Lowest sugar was 70s >> 82 >> 91; she has hypoglycemia awareness in the 70s Highest sugar was 145 >> 218 (steroids) >> 186.  Glucometer: One Touch  She stopped eating meat as she develops a rash with it.  Of note, alpha gal mutation was negative. Pt's meals are: - Breakfast: cheerios + raisin bran or cornflakes + 2% milk or bacon + egg + toast - Lunch: salad or soup or sandwich - Dinner: veggies + starch - Snacks: no desserts; apple, chips She saw a dietitian 07/09/2015-this helped a lot.  -+ mild CKD, last BUN/creatinine:  Lab Results  Component Value Date   BUN 23 12/30/2018   CREATININE 1.07 (H) 12/30/2018  10/22/2018: ACR 11.8  -+ HL; last set of lipids: 10/22/2018: 228/352/39/119 Recent lipids: TG 600  >> started Fenofibrate >> TG 200s 10/10/2016: 246/381/40/130   Lab Results  Component Value Date   CHOL 208 (H) 11/01/2015   HDL 40 11/01/2015   LDLCALC 131 (H) 11/01/2015   TRIG 183 (H) 11/01/2015   CHOLHDL 5.2 (H) 11/01/2015  She is intolerant to statins >> bone pain.  She tried Benecol spread but did not like it. She tried Fenofibrate >> rash.  - last eye exam was on 10/03/2017: No DR  - no numbness and tingling in her feet.  She also has HTN,  OSA, depression, osteoarthritis, spinal stenosis, osteopenia, GERD, CKD stage III. + h/o colon cancer.  TSH was normal: 10/22/2018: TSH 0.76 Lab Results  Component Value Date   TSH 2.140 03/22/2018   Last year she was found to have elevated ATA antibodies >> Hashimoto's thyroiditis: Lab Results  Component Value Date   THGAB 93.8 (H) 03/22/2018    ROS: Constitutional: no weight gain/no weight loss, no fatigue, no subjective hyperthermia, no subjective hypothermia Eyes: no blurry vision, no xerophthalmia ENT: no sore throat, no nodules palpated in neck, no dysphagia, no odynophagia, no hoarseness Cardiovascular: no CP/no SOB/no palpitations/no leg swelling Respiratory: no cough/no SOB/no wheezing Gastrointestinal: no N/no V/no D/no C/no acid reflux Musculoskeletal: no muscle aches/no joint aches Skin: no rashes, no hair loss Neurological: no tremors/no  numbness/no tingling/no dizziness  I reviewed pt's medications, allergies, PMH, social hx, family hx, and changes were documented in the history of present illness. Otherwise, unchanged from my initial visit note.   Past Medical History:  Diagnosis Date  . Arthritis   . Colon cancer ([place])    colon ca dx 2008 and 2010  . Complication of anesthesia    problems waking up-patient thinks she was given too much medication  . Gestational diabetes   . Headache    headaches all the time  . Hypertension   . Sleep apnea    uses CPAP  . Urticaria    Past Surgical History:   Procedure Laterality Date  . ABDOMINAL HYSTERECTOMY    . APPENDECTOMY    . BACK SURGERY  2009  . CARPAL TUNNEL RELEASE    . COLON SURGERY    . COLONOSCOPY WITH PROPOFOL N/A 08/10/2015   Procedure: COLONOSCOPY WITH PROPOFOL;  Surgeon: Garlan Fair, MD;  Location: [facility];  Service: Endoscopy;  Laterality: N/A;  . DILATION AND CURETTAGE OF UTERUS     had before had hysterectomy  . ESOPHAGOGASTRODUODENOSCOPY (EGD) WITH PROPOFOL N/A 08/10/2015   Procedure: ESOPHAGOGASTRODUODENOSCOPY (EGD) WITH PROPOFOL;  Surgeon: Garlan Fair, MD;  Location: [facility];  Service: Endoscopy;  Laterality: N/A;  . EYE SURGERY     bilateral cataract surgery   . ROTATOR CUFF REPAIR     Social History   Socioeconomic History  . Marital status: Divorced    Spouse name: Not on file  . Number of children: 4  . Years of education: 12+  . Highest education level: Not on file  Occupational History  . Occupation: [employer]  Social Needs  . Financial resource strain: Not on file  . Food insecurity:    Worry: Not on file    Inability: Not on file  . Transportation needs:    Medical: Not on file    Non-medical: Not on file  Tobacco Use  . Smoking status: Never Smoker  . Smokeless tobacco: Never Used  . Tobacco comment: never used tobacco  Substance and Sexual Activity  . Alcohol use: No    Alcohol/week: 0.0 standard drinks  . Drug use: No  . Sexual activity: Not on file  Lifestyle  . Physical activity:    Days per week: Not on file    Minutes per session: Not on file  . Stress: Not on file  Relationships  . Social connections:    Talks on phone: Not on file    Gets together: Not on file    Attends religious service: Not on file    Active member of club or organization: Not on file    Attends meetings of clubs or organizations: Not on file    Relationship status: Not on file  . Intimate partner violence:    Fear of current or ex partner: Not on file    Emotionally abused:  Not on file    Physically abused: Not on file    Forced sexual activity: Not on file  Other Topics Concern  . Not on file  Social History Narrative   Lives at home with herself.   Caffeine use: Drinks coffee and tea (1 cup coffee per day). Tea rarely    Current Outpatient Medications on File Prior to Visit  Medication Sig Dispense Refill  . amLODipine (NORVASC) 5 MG tablet Take 5 mg by mouth Daily.    Marland Kitchen aspirin 81 MG tablet Take 81 mg  by mouth daily.    . beta carotene w/minerals (OCUVITE) tablet Take 1 tablet by mouth daily.    . cetirizine (ZYRTEC) 10 MG tablet Take 20 mg by mouth daily.  1  . Cholecalciferol (VITAMIN D-3 PO) Take 2,000 Units by mouth daily.    . ciprofloxacin (CIPRO) 500 MG tablet Take 1 tablet (500 mg total) by mouth 2 (two) times daily. 20 tablet 0  . fenofibrate 54 MG tablet 54 mg daily.    . fexofenadine (ALLEGRA) 180 MG tablet Take 1 tablet (180 mg total) by mouth 2 (two) times daily. 60 tablet 3  . FLUoxetine (PROZAC) 20 MG capsule Take 20 mg by mouth Daily.    . fluticasone (FLONASE) 50 MCG/ACT nasal spray Place 1 spray into both nostrils daily.    . Glucosamine HCl 1500 MG TABS Take 1,500 mg by mouth daily.     . hydrOXYzine (ATARAX/VISTARIL) 25 MG tablet Take 25 mg by mouth at bedtime.  5  . levocetirizine (XYZAL) 5 MG tablet Take 5 mg by mouth daily. Take 2 tablets by mouth once daily in the morning.    Marland Kitchen levothyroxine (SYNTHROID, LEVOTHROID) 112 MCG tablet Take 112 mcg by mouth daily before breakfast.     . metFORMIN (GLUCOPHAGE) 1000 MG tablet Take 1 tablet (1,000 mg total) by mouth daily with breakfast. 90 tablet 0  . metroNIDAZOLE (FLAGYL) 500 MG tablet Take 1 tablet (500 mg total) by mouth 3 (three) times daily. 30 tablet 0  . Multiple Vitamin (MULTIVITAMIN) capsule Take 1 capsule by mouth daily.    Marland Kitchen omeprazole (PRILOSEC) 20 MG capsule Take 20 mg by mouth daily as needed.    . polyvinyl alcohol (LIQUIFILM TEARS) 1.4 % ophthalmic solution Place 1 drop  into both eyes as needed for dry eyes.    . Probiotic Product (PROBIOTIC DAILY) CAPS Take 1 capsule by mouth every morning.     . triamterene-hydrochlorothiazide (DYAZIDE) 37.5-25 MG per capsule Take 1 capsule by mouth Daily.      No current facility-administered medications on file prior to visit.    Allergies  Allergen Reactions  . Statins     Aches in her bones   Family History  Problem Relation Age of Onset  . Migraines Neg Hx    PE: BP (!) 158/70 Comment: states she has not taken her BP med yet today  Pulse 61   Temp 98.1 F (36.7 C)   Ht 5\' 1"  (1.549 m)   Wt 156 lb (70.8 kg)   SpO2 97%   BMI 29.48 kg/m  Body mass index is 29.48 kg/m. Wt Readings from Last 3 Encounters:  02/11/19 156 lb (70.8 kg)  12/30/18 154 lb (69.9 kg)  08/30/18 154 lb 6.4 oz (70 kg)   Constitutional: overweight, in NAD Eyes: PERRLA, EOMI, no exophthalmos ENT: moist mucous membranes, +L thyromegaly, no cervical lymphadenopathy Cardiovascular: RRR, No MRG Respiratory: CTA B Gastrointestinal: abdomen soft, NT, ND, BS+ Musculoskeletal: no deformities, strength intact in all 4 Skin: moist, warm, no rashes Neurological: no tremor with outstretched hands, DTR normal in all 4  ASSESSMENT: 1. DM2, non-insulin-dependent, controlled, with long-term complications -Mild CKD  2. HL  3.  Enlarged thyroid - L  PLAN:  1. Patient with history of controlled diabetes, on oral antidiabetic regimen with half maximal metformin dose only.  We cannot use a maximal dose of metformin due to diarrhea.  At last visit, sugars are mostly at goal with an exception of a sugar in the 200s  while on steroids.  Since then, she did not start checking more frequently and at today's visit, per review of her meter download, she only checked before dinner.  This sugars are mostly at target but occasionally above.  I strongly advised her to start checking at different times of the day, rotating check times. - For now, we will  continue the current dose of metformin but she is taking it with breakfast and I explained that it can be more helpful if taken before dinner to help decreasing overnight increased hepatic gluconeogenesis - I suggested to:  Patient Instructions  Please continue metformin 1000 mg but move this with dinner.  Please return in 6 months with your sugar log.   - today, HbA1c is 6.5% (slightly higher) - check sugars at different times of the day - check 1x a day, rotating checks - advised for yearly eye exams >> she is not UTD - Return to clinic in 6 mo with sugar log   2. HL - Reviewed latest lipid panel from 2018: Total cholesterol and triglycerides were high, worse triglycerides decreased to the 200s, but unfortunately, she developed a rash and stopped fenofibrate.  Latest triglycerides were reviewed from earlier this year and they were 352, higher. - She is intolerant to statins    3.  Enlarged thyroid -left -Left thyroid lobe is more prominent on palpation no neck compression symptoms -Denies neck compression symptoms -She has positive ATA antibodies in the normal TSH >> euthyroid Hashimoto's thyroiditis -She is concerned about this.  We will check a thyroid ultrasound.  Orders Placed This Encounter  Procedures  . US THYROID   FINDINGS: Parenchymal Echotexture: Markedly heterogenous  Isthmus: 3.4 mm  Right lobe: 3.6 x 1.3 x 1.1 cm  Left lobe: 3.6 x 1.2 x 0.7 cm  _________________________________________________________  Estimated total number of nodules >/= 1 cm: 0  Number of spongiform nodules >/=  2 cm not described below (TR1): 0  Number of mixed cystic and solid nodules >/= 1.5 cm not described below (TR2): 0  _________________________________________________________  Marked heterogeneity and atrophy of the entire thyroid gland. Despite this, no discrete focal abnormality or nodule. No hypervascularity. No regional adenopathy.  IMPRESSION: Nonspecific  thyroid heterogeneity and atrophy by ultrasound. See above comment.  The above is in keeping with the ACR TI-RADS recommendations - J Am Coll Radiol 2017;14:587-595.  Philemon Kingdom, MD PhD [facility] Endocrinology

## 2019-02-11 NOTE — Patient Instructions (Addendum)
Please continue metformin 1000 mg but move this with dinner.  Please return in 6 months with your sugar log.

## 2019-03-03 ENCOUNTER — Ambulatory Visit
Admission: RE | Admit: 2019-03-03 | Discharge: 2019-03-03 | Disposition: A | Discharge status: Home / Self Care | Payer: Medicare Other | Source: Ambulatory Visit | Attending: Internal Medicine | Admitting: Internal Medicine

## 2019-03-03 ENCOUNTER — Other Ambulatory Visit: Payer: Self-pay

## 2019-03-03 DIAGNOSIS — E049 Nontoxic goiter, unspecified: Secondary | ICD-10-CM

## 2019-03-06 ENCOUNTER — Other Ambulatory Visit: Payer: Self-pay

## 2019-03-06 ENCOUNTER — Ambulatory Visit
Admission: RE | Admit: 2019-03-06 | Discharge: 2019-03-06 | Disposition: A | Discharge status: Home / Self Care | Payer: Medicare Other | Source: Ambulatory Visit | Attending: Family Medicine | Admitting: Family Medicine

## 2019-03-06 DIAGNOSIS — M8580 Other specified disorders of bone density and structure, unspecified site: Secondary | ICD-10-CM

## 2019-03-11 ENCOUNTER — Encounter: Payer: Self-pay | Admitting: Internal Medicine

## 2019-03-12 ENCOUNTER — Telehealth: Payer: Self-pay | Admitting: Internal Medicine

## 2019-03-12 NOTE — Telephone Encounter (Signed)
Patient has questions about recent lab/scans. Please call her at [phone]

## 2019-04-05 IMAGING — MG DIGITAL SCREENING BILATERAL MAMMOGRAM WITH TOMO AND CAD
8 series · 9 of 24 positions shown · non-contrast
Comparison: Previous exam(s).

CLINICAL DATA: Screening.

EXAM:
DIGITAL SCREENING BILATERAL MAMMOGRAM WITH TOMO AND CAD

[R CC synth-2D]
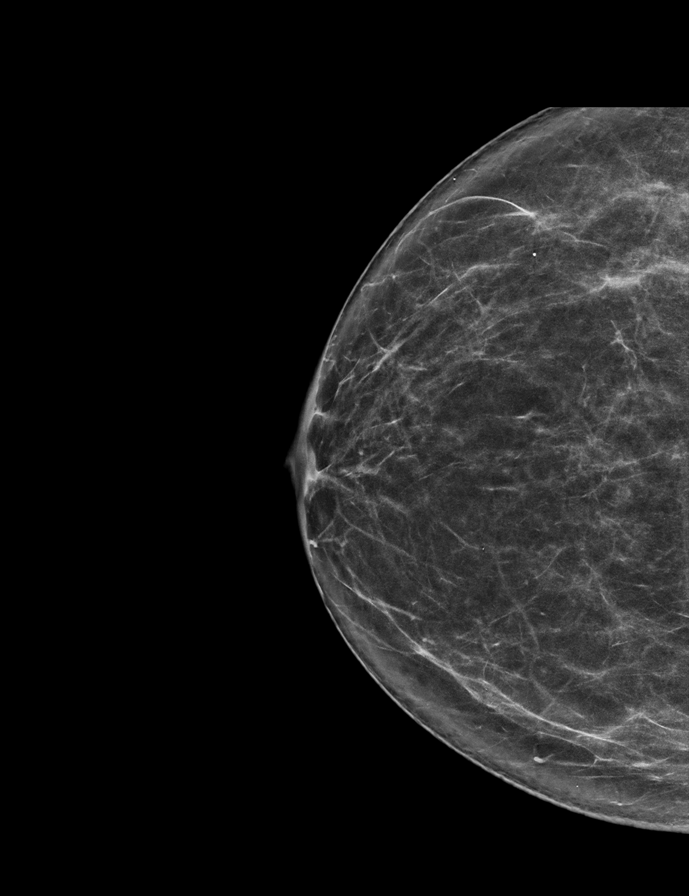

[L MLO synth-2D]
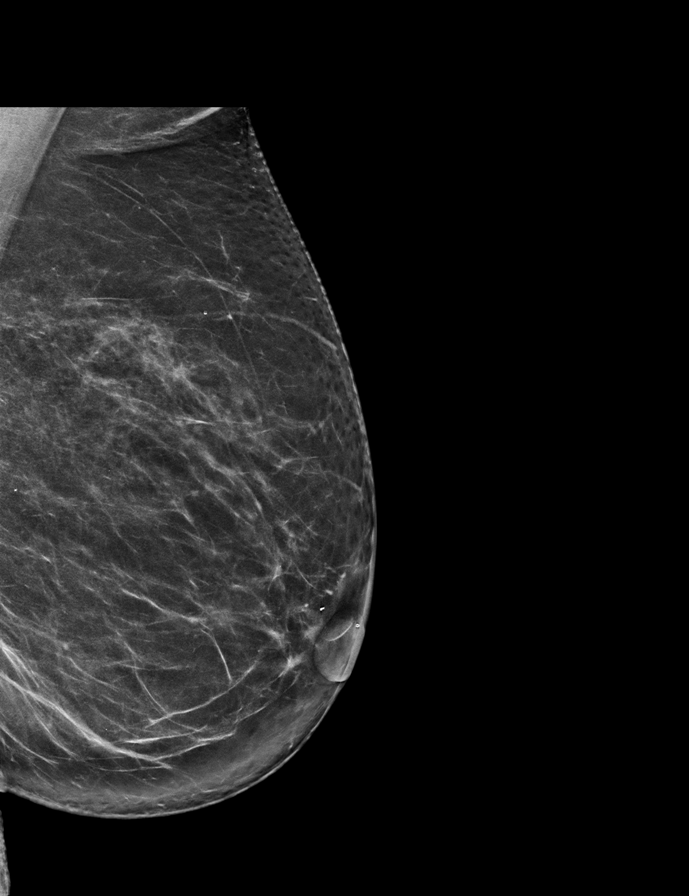

[R MLO synth-2D]
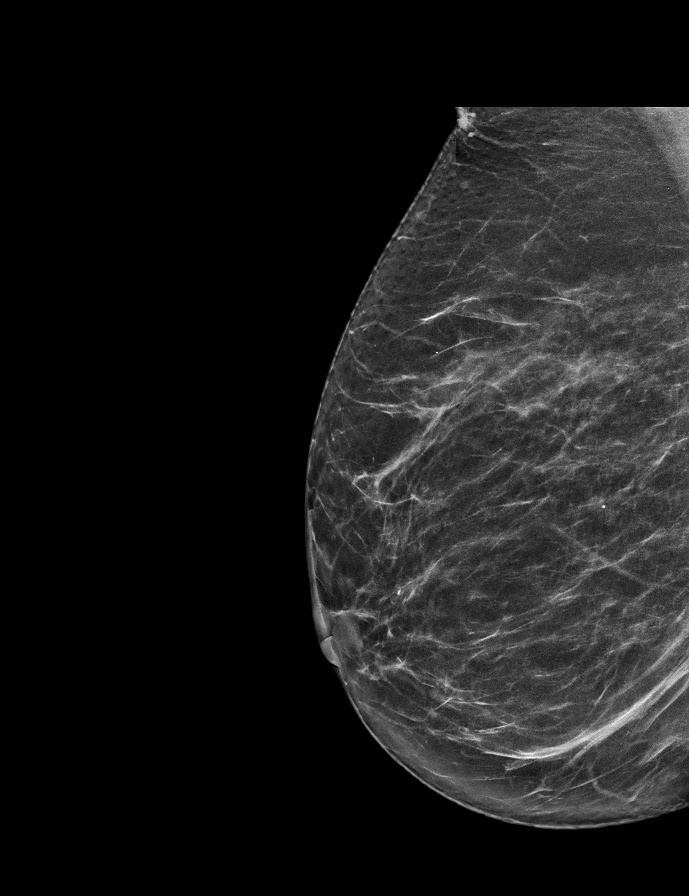

[L CC synth-2D]
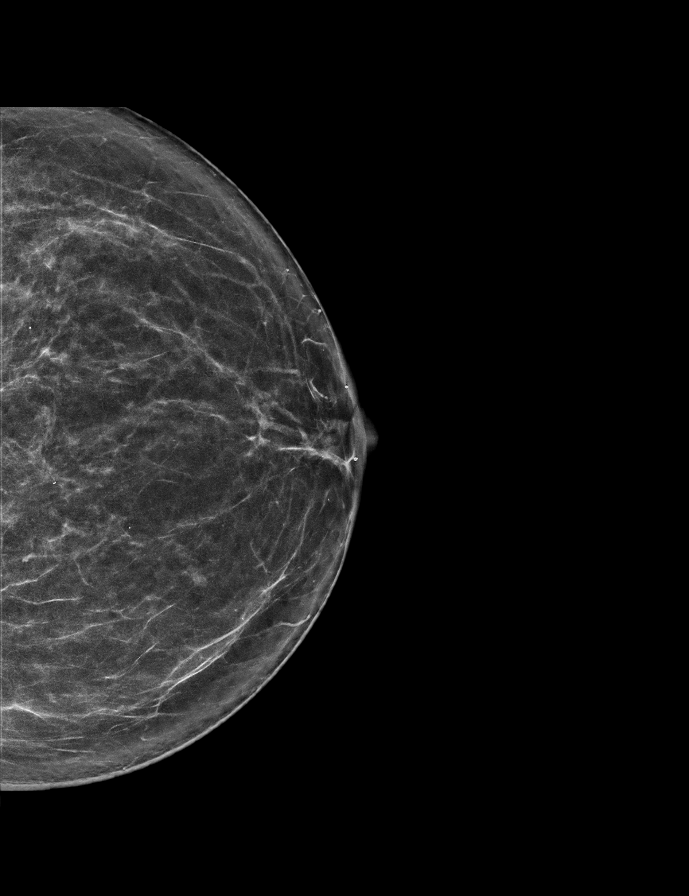

[L CC tomo · 2 of 68 frames shown]
[frame 22/68]
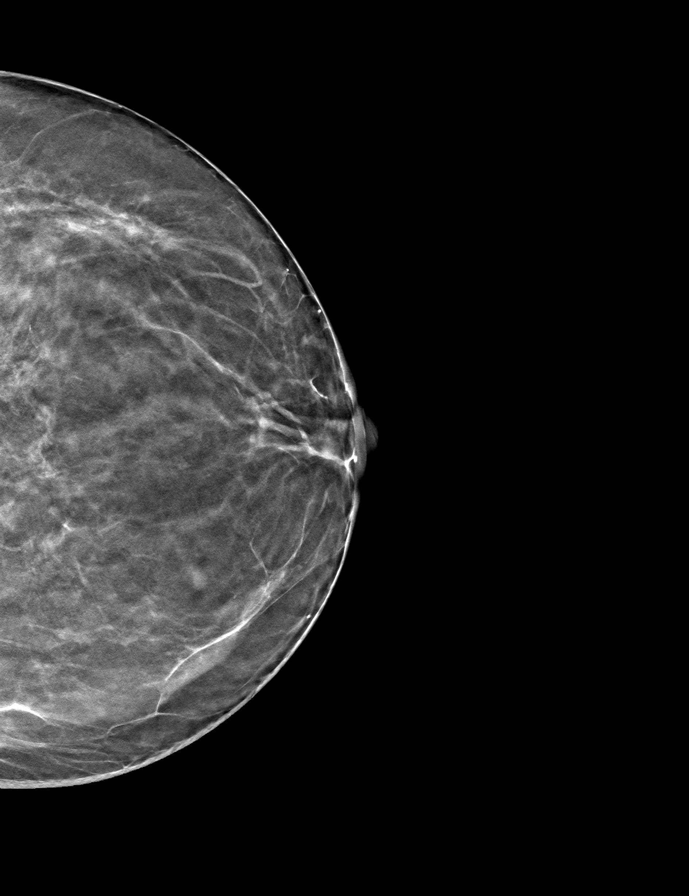
[frame 35/68]
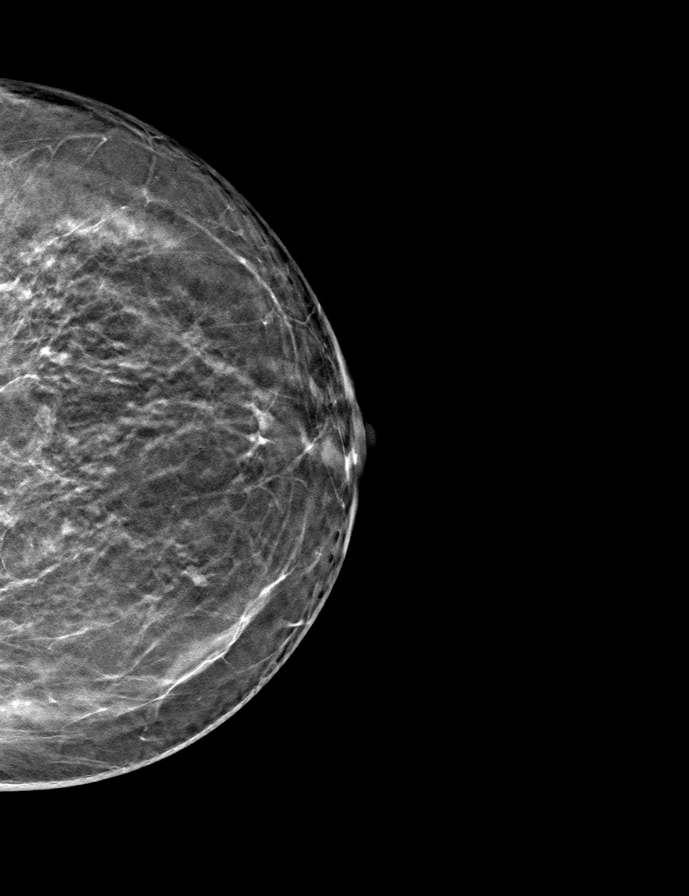

[L MLO tomo · tomo slice 39/76.0]
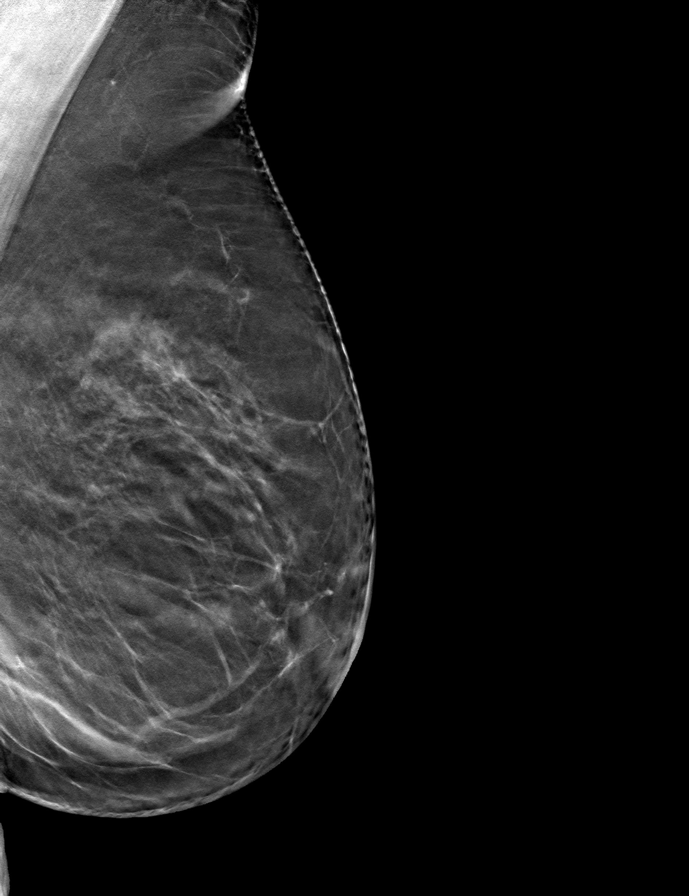

[R MLO tomo · tomo slice 36/71.0]
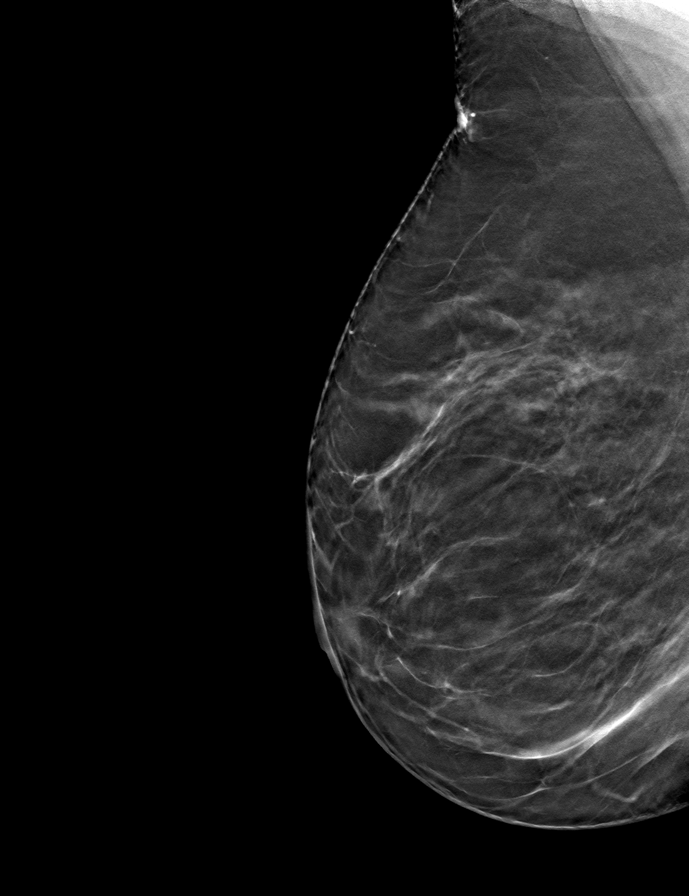

[R CC tomo · tomo slice 35/69.0]
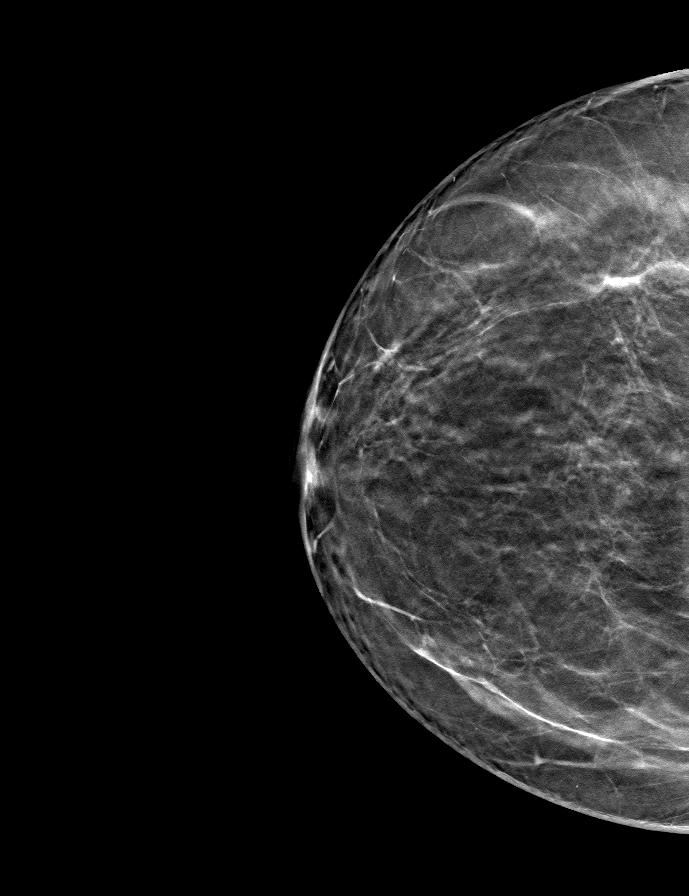

[9 of 24 positions shown; findings below may reference images not displayed]

ACR Breast Density Category b: There are scattered areas of
fibroglandular density.
FINDINGS: There are no findings suspicious for malignancy. Images were
processed with CAD.
IMPRESSION: No mammographic evidence of malignancy. A result letter of this
screening mammogram will be mailed directly to the patient.

RECOMMENDATION:
Screening mammogram in one year. (Code:[id number])

BI-RADS CATEGORY  1: Negative.

## 2019-05-01 ENCOUNTER — Inpatient Hospital Stay: Payer: Medicare Other | Attending: Hematology & Oncology

## 2019-05-01 ENCOUNTER — Inpatient Hospital Stay (HOSPITAL_BASED_OUTPATIENT_CLINIC_OR_DEPARTMENT_OTHER): Payer: Medicare Other | Admitting: Hematology & Oncology

## 2019-05-01 ENCOUNTER — Other Ambulatory Visit: Payer: Self-pay

## 2019-05-01 ENCOUNTER — Encounter: Payer: Self-pay | Admitting: Hematology & Oncology

## 2019-05-01 VITALS — BP 160/58 | HR 51 | Temp 98.0°F | Resp 20 | Wt 153.4 lb

## 2019-05-01 DIAGNOSIS — C184 Malignant neoplasm of transverse colon: Secondary | ICD-10-CM

## 2019-05-01 DIAGNOSIS — Z79899 Other long term (current) drug therapy: Secondary | ICD-10-CM | POA: Diagnosis not present

## 2019-05-01 DIAGNOSIS — C189 Malignant neoplasm of colon, unspecified: Secondary | ICD-10-CM | POA: Insufficient documentation

## 2019-05-01 DIAGNOSIS — Z888 Allergy status to other drugs, medicaments and biological substances status: Secondary | ICD-10-CM | POA: Diagnosis not present

## 2019-05-01 LAB — CBC WITH DIFFERENTIAL (CANCER CENTER ONLY)
Abs Immature Granulocytes: 0.01 10*3/uL (ref 0.00–0.07)
Basophils Absolute: 0 10*3/uL (ref 0.0–0.1)
Basophils Relative: 0 %
Eosinophils Absolute: 0.3 10*3/uL (ref 0.0–0.5)
Eosinophils Relative: 6 %
HCT: 35.4 % — ABNORMAL LOW (ref 36.0–46.0)
Hemoglobin: 11.8 g/dL — ABNORMAL LOW (ref 12.0–15.0)
Immature Granulocytes: 0 %
Lymphocytes Relative: 37 %
Lymphs Abs: 1.9 10*3/uL (ref 0.7–4.0)
MCH: 30.2 pg (ref 26.0–34.0)
MCHC: 33.3 g/dL (ref 30.0–36.0)
MCV: 90.5 fL (ref 80.0–100.0)
Monocytes Absolute: 0.5 10*3/uL (ref 0.1–1.0)
Monocytes Relative: 9 %
Neutro Abs: 2.6 10*3/uL (ref 1.7–7.7)
Neutrophils Relative %: 48 %
Platelet Count: 171 10*3/uL (ref 150–400)
RBC: 3.91 MIL/uL (ref 3.87–5.11)
RDW: 12.5 % (ref 11.5–15.5)
WBC Count: 5.3 10*3/uL (ref 4.0–10.5)
nRBC: 0 % (ref 0.0–0.2)

## 2019-05-01 LAB — CMP (CANCER CENTER ONLY)
ALT: 30 U/L (ref 0–44)
AST: 20 U/L (ref 15–41)
Albumin: 4.3 g/dL (ref 3.5–5.0)
Alkaline Phosphatase: 39 U/L (ref 38–126)
Anion gap: 7 (ref 5–15)
BUN: 17 mg/dL (ref 8–23)
CO2: 31 mmol/L (ref 22–32)
Calcium: 9.3 mg/dL (ref 8.9–10.3)
Chloride: 100 mmol/L (ref 98–111)
Creatinine: 0.94 mg/dL (ref 0.44–1.00)
GFR, Est AFR Am: >60 mL/min (ref >60)
GFR, Estimated: 58 mL/min — ABNORMAL LOW (ref >60)
Glucose, Bld: 124 mg/dL — ABNORMAL HIGH (ref 70–99)
Potassium: 4.6 mmol/L (ref 3.5–5.1)
Sodium: 138 mmol/L (ref 135–145)
Total Bilirubin: 0.5 mg/dL (ref 0.3–1.2)
Total Protein: 6.4 g/dL — ABNORMAL LOW (ref 6.5–8.1)

## 2019-05-01 LAB — CEA (IN HOUSE-CHCC): CEA (CHCC-In House): 2.39 ng/mL (ref 0.00–5.00)
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
## 2019-06-09 ENCOUNTER — Telehealth: Payer: Self-pay | Admitting: Internal Medicine

## 2019-06-09 NOTE — Telephone Encounter (Signed)
Left a message for pt to call office back.  °

## 2019-06-09 NOTE — Telephone Encounter (Signed)
Patient states that she is having a very upset and is wanting to know if her Metformin could be the cause. States it is really bad.  Please Advise, Thanks

## 2019-06-10 ENCOUNTER — Other Ambulatory Visit: Payer: Self-pay

## 2019-06-10 ENCOUNTER — Telehealth: Payer: Self-pay

## 2019-06-10 MED ORDER — METFORMIN HCL ER 500 MG PO TB24: ORAL_TABLET | ORAL | 5 refills | Status: DC

## 2019-06-10 NOTE — Telephone Encounter (Signed)
I spoke with patient after informing Dr. Cruzita Lederer pt's and I discussion.  I called pt back and instructed her of new suggestion, per Dr. Cruzita Lederer, It is possible for the diarrhea to be related to metformin. Let's send a prescription for metformin extended release 500 mg to be taken twice a day with meals. #60 with 5 refills.  She will most likely tolerate this much better.   Rx sent in today.

## 2019-06-10 NOTE — Telephone Encounter (Signed)
It is possible for the diarrhea to be related to metformin. Let's send a prescription for metformin extended release 500 mg to be taken twice a day with meals. #60 with 5 refills.  She will most likely tolerate this much better.

## 2019-06-10 NOTE — Telephone Encounter (Signed)
Patient returned phone call in regards to her message left yesterday.  Patient has been having some diarrhea for around 2 weeks now.  Patient wanting to know if it was due to her Metformin.  I informed pt that, diarrhea is one of the side effects from taking Metformin.  I also explained that I would send a message to Dr. Cruzita Lederer to let her know of patient's issues. Patient is currently on Metformin 1000mg  QD.  Please advise.

## 2019-06-16 NOTE — Telephone Encounter (Signed)
Done

## 2019-07-25 ENCOUNTER — Other Ambulatory Visit: Payer: Self-pay | Admitting: Gastroenterology

## 2019-07-25 DIAGNOSIS — R112 Nausea with vomiting, unspecified: Secondary | ICD-10-CM

## 2019-07-30 ENCOUNTER — Ambulatory Visit
Admission: RE | Admit: 2019-07-30 | Discharge: 2019-07-30 | Disposition: A | Discharge status: Home / Self Care | Payer: Medicare Other | Source: Ambulatory Visit | Attending: Gastroenterology | Admitting: Gastroenterology

## 2019-07-30 DIAGNOSIS — R112 Nausea with vomiting, unspecified: Secondary | ICD-10-CM

## 2019-08-06 ENCOUNTER — Other Ambulatory Visit (HOSPITAL_COMMUNITY): Payer: Self-pay | Admitting: Gastroenterology

## 2019-08-06 ENCOUNTER — Other Ambulatory Visit: Payer: Self-pay | Admitting: Gastroenterology

## 2019-08-06 DIAGNOSIS — R110 Nausea: Secondary | ICD-10-CM

## 2019-08-12 ENCOUNTER — Other Ambulatory Visit: Payer: Self-pay

## 2019-08-14 ENCOUNTER — Ambulatory Visit: Payer: Medicare Other | Admitting: Internal Medicine

## 2019-08-14 ENCOUNTER — Encounter: Payer: Self-pay | Admitting: Internal Medicine

## 2019-08-14 VITALS — BP 160/70 | HR 62 | Ht 61.0 in | Wt 155.0 lb

## 2019-08-14 DIAGNOSIS — E785 Hyperlipidemia, unspecified: Secondary | ICD-10-CM | POA: Diagnosis not present

## 2019-08-14 DIAGNOSIS — E049 Nontoxic goiter, unspecified: Secondary | ICD-10-CM | POA: Diagnosis not present

## 2019-08-14 DIAGNOSIS — E1121 Type 2 diabetes mellitus with diabetic nephropathy: Secondary | ICD-10-CM

## 2019-08-14 DIAGNOSIS — E1169 Type 2 diabetes mellitus with other specified complication: Secondary | ICD-10-CM

## 2019-08-14 LAB — POCT GLYCOSYLATED HEMOGLOBIN (HGB A1C): Hemoglobin A1C: 6.2 % — AB (ref 4.0–5.6)

## 2019-08-14 NOTE — Progress Notes (Signed)
Patient ID: Robin [name], female   DOB: August 07, 1941, 78 y.o.   MRN: [id number]  HPI: Robin [name] is a 78 y.o.-year-old female, returning for f/u for DM2, initially GDM, then DM2 dx in 05/2015, insulin-independent, controlled, with complications (mild CKD). Last visit 6 months ago.  Reviewed HbA1c levels: Lab Results  Component Value Date   HGBA1C 6.5 (A) 02/11/2019   HGBA1C 6.4 (A) 08/12/2018   HGBA1C 6.7 01/25/2018   Pt is on a regimen of: - Metformin ER 1000 mg with dinner -she had diarrhea with higher doses >> 500 mg 2x a day now  Pt checks her sugars once a day per review of her meter: - am:156 >> n/c >> 120-130 >> 144 >> n/c >> 186 x1 - 2h after b'fast: n/c - before lunch: 92 >> n/c >> 82-120, 218 >> n/c >> 86-98 - 2h after lunch: 183-105, 154 >> n/c >> 79, 86 - before dinner: 87-106, 116 >> 91, 104-146, 186 >> 104-120, 174, 183 (apple) - 2h after dinner:  83 >> 120-130 >> n/c - bedtime: 115, 116 >> 94  >> n/c - nighttime: n/c Lowest sugar was 70s >> 82 >> 91 >> 79; she has hypoglycemia awareness in the 70s. Highest sugar was 145 >> 218 (steroids) >> 186 >> 186.  Glucometer: One Touch  Pt's meals are: - Breakfast: cheerios + raisin bran or cornflakes + 2% milk or bacon + egg + toast - Lunch: salad or soup or sandwich - Dinner: veggies + starch - Snacks: no desserts; apple, chips She saw a dietitian 07/09/2015-this helped a lot. In 2019 had a pruritic maculopapular generalized rash. She saw PCP and dermatology and no etiology was found.she then saw Dr. Marin Olp - suspicion for mastocytosis  - BM Bx negative.  She found out she is allergic to meat.  She stopped eating meat and the rash resolved.  Of note, alpha gal mutation was negative. She is now investigated for possible gall stones.   + Mild CKD, last BUN/creatinine:  Lab Results  Component Value Date   BUN 17 05/01/2019   CREATININE 0.94 05/01/2019  10/22/2018: ACR 11.8  + HL; last set of lipids: 10/22/2018:  228/352/39/119 Recent lipids: TG 600 >> started Fenofibrate >> TG 200s 10/10/2016: 246/381/40/130   Lab Results  Component Value Date   CHOL 208 (H) 11/01/2015   HDL 40 11/01/2015   LDLCALC 131 (H) 11/01/2015   TRIG 183 (H) 11/01/2015   CHOLHDL 5.2 (H) 11/01/2015  She tried statins >> Bone pain .  She tried Benecol spread but did not like it.  She tried fenofibrate >> rash.  - last eye exam was on 09/2018: No DR  -She denies numbness and tingling in her feet.  She also has HTN,  OSA, depression, osteoarthritis, spinal stenosis, osteopenia, GERD, CKD stage III. + h/o colon cancer.  Latest TSH was reviewed and this was normal: 10/22/2018: TSH 0.76 Lab Results  Component Value Date   TSH 2.140 03/22/2018   In 2019 she was found to have elevated ATA antibodies, giving her a diagnosis of Hashimoto's thyroiditis: Lab Results  Component Value Date   THGAB 93.8 (H) 03/22/2018   At last visit, we checked a thyroid ultrasound as she had a prominent left thyroid lobe on palpation: Thyroid U/S (03/03/2019): no nodules, heterogeneity and atrophy  ROS: Constitutional: no weight gain/no weight loss, no fatigue, no subjective hyperthermia, no subjective hypothermia Eyes: no blurry vision, no xerophthalmia ENT: no sore throat, no nodules palpated  in neck, no dysphagia, no odynophagia, no hoarseness Cardiovascular: no CP/no SOB/no palpitations/no leg swelling Respiratory: no cough/no SOB/no wheezing Gastrointestinal: + N/no V/no D/no C/no acid reflux Musculoskeletal: no muscle aches/no joint aches Skin: no rashes, no hair loss Neurological: no tremors/no numbness/no tingling/no dizziness  I reviewed pt's medications, allergies, PMH, social hx, family hx, and changes were documented in the history of present illness. Otherwise, unchanged from my initial visit note.   Past Medical History:  Diagnosis Date  . Arthritis   . Colon cancer ([place])    colon ca dx 2008 and 2010  . Complication  of anesthesia    problems waking up-patient thinks she was given too much medication  . Gestational diabetes   . Headache    headaches all the time  . Hypertension   . Sleep apnea    uses CPAP  . Urticaria    Past Surgical History:  Procedure Laterality Date  . ABDOMINAL HYSTERECTOMY    . APPENDECTOMY    . BACK SURGERY  2009  . CARPAL TUNNEL RELEASE    . COLON SURGERY    . COLONOSCOPY WITH PROPOFOL N/A 08/10/2015   Procedure: COLONOSCOPY WITH PROPOFOL;  Surgeon: Garlan Fair, MD;  Location: [facility];  Service: Endoscopy;  Laterality: N/A;  . DILATION AND CURETTAGE OF UTERUS     had before had hysterectomy  . ESOPHAGOGASTRODUODENOSCOPY (EGD) WITH PROPOFOL N/A 08/10/2015   Procedure: ESOPHAGOGASTRODUODENOSCOPY (EGD) WITH PROPOFOL;  Surgeon: Garlan Fair, MD;  Location: [facility];  Service: Endoscopy;  Laterality: N/A;  . EYE SURGERY     bilateral cataract surgery   . ROTATOR CUFF REPAIR     Social History   Socioeconomic History  . Marital status: Divorced    Spouse name: Not on file  . Number of children: 4  . Years of education: 12+  . Highest education level: Not on file  Occupational History  . Occupation: [employer]  Social Needs  . Financial resource strain: Not on file  . Food insecurity    Worry: Not on file    Inability: Not on file  . Transportation needs    Medical: Not on file    Non-medical: Not on file  Tobacco Use  . Smoking status: Never Smoker  . Smokeless tobacco: Never Used  . Tobacco comment: never used tobacco  Substance and Sexual Activity  . Alcohol use: No    Alcohol/week: 0.0 standard drinks  . Drug use: No  . Sexual activity: Not on file  Lifestyle  . Physical activity    Days per week: Not on file    Minutes per session: Not on file  . Stress: Not on file  Relationships  . Social Herbalist on phone: Not on file    Gets together: Not on file    Attends religious service: Not on file    Active member  of club or organization: Not on file    Attends meetings of clubs or organizations: Not on file    Relationship status: Not on file  . Intimate partner violence    Fear of current or ex partner: Not on file    Emotionally abused: Not on file    Physically abused: Not on file    Forced sexual activity: Not on file  Other Topics Concern  . Not on file  Social History Narrative   Lives at home with herself.   Caffeine use: Drinks coffee and tea (1 cup coffee per  day). Tea rarely    Current Outpatient Medications on File Prior to Visit  Medication Sig Dispense Refill  . amLODipine (NORVASC) 5 MG tablet Take 5 mg by mouth Daily.    Marland Kitchen aspirin 81 MG tablet Take 81 mg by mouth daily.    . beta carotene w/minerals (OCUVITE) tablet Take 1 tablet by mouth daily.    . cetirizine (ZYRTEC) 10 MG tablet Take 20 mg by mouth daily.  1  . Cholecalciferol (VITAMIN D-3 PO) Take 2,000 Units by mouth daily.    . ciprofloxacin (CIPRO) 500 MG tablet Take 1 tablet (500 mg total) by mouth 2 (two) times daily. 20 tablet 0  . fenofibrate 54 MG tablet 54 mg daily.    . fexofenadine (ALLEGRA) 180 MG tablet Take 1 tablet (180 mg total) by mouth 2 (two) times daily. 60 tablet 3  . Fluocinolone Acetonide Body 0.01 % OIL APPLY OIL TOPICALLY TO AFFECTED AREA TWICE DAILY AS NEEDED (APPLY SPARINGLY)    . FLUoxetine (PROZAC) 20 MG capsule Take 20 mg by mouth Daily.    . fluticasone (FLONASE) 50 MCG/ACT nasal spray Place 1 spray into both nostrils daily.    . Glucosamine HCl 1500 MG TABS Take 1,500 mg by mouth daily.     Marland Kitchen glucose blood test strip     . hydrOXYzine (ATARAX/VISTARIL) 25 MG tablet Take 25 mg by mouth at bedtime.  5  . Lancets (ONETOUCH DELICA PLUS [id number]) MISC USE TO CHECK YOUR BLOOD SUGAR ONCE OR TWICE DAILY    . levocetirizine (XYZAL) 5 MG tablet Take 10 mg by mouth daily. Take 2 tablets by mouth once daily in the morning.    Marland Kitchen levothyroxine (SYNTHROID, LEVOTHROID) 112 MCG tablet Take 112 mcg by mouth  daily before breakfast.     . metFORMIN (GLUCOPHAGE XR) 500 MG 24 hr tablet take twice a day, with meals. 60 tablet 5  . metroNIDAZOLE (FLAGYL) 500 MG tablet Take 1 tablet (500 mg total) by mouth 3 (three) times daily. 30 tablet 0  . Multiple Vitamin (MULTIVITAMIN) capsule Take 1 capsule by mouth daily.    Marland Kitchen omeprazole (PRILOSEC) 20 MG capsule Take 20 mg by mouth daily as needed.    . polyvinyl alcohol (LIQUIFILM TEARS) 1.4 % ophthalmic solution Place 1 drop into both eyes as needed for dry eyes.    . Probiotic Product (PROBIOTIC DAILY) CAPS Take 1 capsule by mouth every morning.     . triamterene-hydrochlorothiazide (DYAZIDE) 37.5-25 MG per capsule Take 1 capsule by mouth Daily.      No current facility-administered medications on file prior to visit.    Allergies  Allergen Reactions  . Statins     Aches in her bones   Family History  Problem Relation Age of Onset  . Migraines Neg Hx    PE: BP (!) 160/70 Comment: has not taken her med today  Pulse 62   Ht 5\' 1"  (1.549 m)   Wt 155 lb (70.3 kg)   SpO2 96%   BMI 29.29 kg/m  Body mass index is 29.29 kg/m. Wt Readings from Last 3 Encounters:  08/14/19 155 lb (70.3 kg)  05/01/19 153 lb 6.4 oz (69.6 kg)  02/11/19 156 lb (70.8 kg)   Constitutional: Slightly overweight, in NAD Eyes: PERRLA, EOMI, no exophthalmos ENT: moist mucous membranes, no thyromegaly, no cervical lymphadenopathy Cardiovascular: RRR, No MRG Respiratory: CTA B Gastrointestinal: abdomen soft, NT, ND, BS+ Musculoskeletal: no deformities, strength intact in all 4 Skin: moist, warm, no rashes  Neurological: no tremor with outstretched hands, DTR normal in all 4  ASSESSMENT: 1. DM2, non-insulin-dependent, controlled, with long-term complications -Mild CKD  2. HL  3.  Enlarged thyroid - L  PLAN:  1. Patient with history of controlled diabetes, on oral antidiabetic regimen with maximum metformin dose only.  We could not increase her dose due to diarrhea.  At  last visit, however, sugars were at goal, however, she was not checking frequently and we discussed about starting to check more consistently, rotating check times.  I did not change her regimen at last visit other than advising her to move Metformin with dinner, to help more with overnight hepatic gluconeogenesis. -Reviewed latest HbA1c from 6 months ago and this was at goal, at 6.5% -At this visit, I reviewed her blood sugars in her meter and they are mostly at goal with only few hyperglycemic exceptions, and upon questioning, these sugars were checked after a snack.  No significant low blood sugars or high blood sugar since last visit.  We can continue the current Metformin ER regimen.  She continues to have nausea, which is investigated by GI.  She does not feel that the Metformin ER is the cause for the nausea. - I suggested to:  Patient Instructions  Please continue Metformin ER 500 mg 2x a day  Please return in 6 months with your sugar log.   - we checked her HbA1c: 6.2% (lower) - advised to check sugars at different times of the day - 1x a day, rotating check times - advised for yearly eye exams >> she is UTD - return to clinic in 6 months  2. HL - Reviewed latest lipid panel from 09/2018: LDL cholesterol and triglycerides high -She is intolerant to statins.  She also tried fenofibrate and developed a rash.   3.  Enlarged thyroid -left -At last visit, her left thyroid lobe appeared more prominent on palpation so we checked a thyroid ultrasound.  We reviewed the results together and I this was negative for nodules.  I explained that this sometimes happens when her thyroid is a little rotated. -No neck compression symptoms -She has positive ATA antibodies and controlled TSH levels, on levothyroxine  Philemon Kingdom, MD PhD [facility] Endocrinology

## 2019-08-14 NOTE — Addendum Note (Signed)
Addended by: Cardell Peach I on: 08/14/2019 09:24 AM   Modules accepted: Orders

## 2019-08-14 NOTE — Patient Instructions (Signed)
Please continue: - Metformin ER 500 mg 2x a day  Please return in 6 months with your sugar log.  

## 2019-08-20 ENCOUNTER — Other Ambulatory Visit: Payer: Self-pay

## 2019-08-20 ENCOUNTER — Encounter (HOSPITAL_COMMUNITY)
Admission: RE | Admit: 2019-08-20 | Discharge: 2019-08-20 | Disposition: A | Discharge status: Home / Self Care | Payer: Medicare Other | Source: Ambulatory Visit | Attending: Gastroenterology | Admitting: Gastroenterology

## 2019-08-20 DIAGNOSIS — R110 Nausea: Secondary | ICD-10-CM | POA: Diagnosis present

## 2019-08-20 MED ORDER — TECHNETIUM TC 99M MEBROFENIN IV KIT
5.1700 | PACK | Freq: Once | INTRAVENOUS | Status: AC | PRN
  Administered 2019-08-20: 5.17 via INTRAVENOUS

## 2019-08-28 ENCOUNTER — Telehealth: Payer: Self-pay

## 2019-08-28 NOTE — Telephone Encounter (Signed)
Thyroid ultrasound report has been faxed.

## 2019-08-28 NOTE — Telephone Encounter (Signed)
Patient called in wanting her results from her Thyroids sent to Dr. Gregary Signs  Fax number [phone].  This is her mass cell doctor wanting the test results

## 2019-10-14 ENCOUNTER — Other Ambulatory Visit: Payer: Self-pay | Admitting: Family Medicine

## 2019-10-14 DIAGNOSIS — Z1231 Encounter for screening mammogram for malignant neoplasm of breast: Secondary | ICD-10-CM

## 2019-10-21 ENCOUNTER — Ambulatory Visit: Payer: Medicare Other

## 2019-10-21 ENCOUNTER — Other Ambulatory Visit: Payer: Self-pay

## 2019-10-21 ENCOUNTER — Ambulatory Visit
Admission: RE | Admit: 2019-10-21 | Discharge: 2019-10-21 | Disposition: A | Discharge status: Home / Self Care | Payer: Medicare PPO | Source: Ambulatory Visit | Attending: Family Medicine | Admitting: Family Medicine

## 2019-10-21 DIAGNOSIS — Z1231 Encounter for screening mammogram for malignant neoplasm of breast: Secondary | ICD-10-CM

## 2019-10-30 ENCOUNTER — Ambulatory Visit: Payer: Medicare PPO | Attending: Internal Medicine

## 2019-10-30 DIAGNOSIS — Z23 Encounter for immunization: Secondary | ICD-10-CM | POA: Insufficient documentation

## 2019-10-30 NOTE — Progress Notes (Signed)
   Covid-19 Vaccination Clinic  Name:  Robin [name]    MRN: [id number] DOB: 25-Jul-1941  10/30/2019  [name] was observed post Covid-19 immunization for 15 minutes without incidence. She was provided with Vaccine Information Sheet and instruction to access the V-Safe system.   [name] was instructed to call 911 with any severe reactions post vaccine: Marland Kitchen Difficulty breathing  . Swelling of your face and throat  . A fast heartbeat  . A bad rash all over your body  . Dizziness and weakness    Immunizations Administered    Name Date Dose VIS Date Route   Pfizer COVID-19 Vaccine 10/30/2019  4:30 PM 0.3 mL 09/05/2019 Intramuscular   Manufacturer: [place]   Lot: [id number]   [place]: [id number]

## 2019-10-31 ENCOUNTER — Other Ambulatory Visit: Payer: Medicare Other

## 2019-10-31 ENCOUNTER — Ambulatory Visit: Payer: Medicare Other | Admitting: Hematology & Oncology

## 2019-11-12 ENCOUNTER — Inpatient Hospital Stay: Payer: Medicare PPO | Admitting: Hematology & Oncology

## 2019-11-12 ENCOUNTER — Inpatient Hospital Stay: Payer: Medicare PPO

## 2019-11-24 ENCOUNTER — Ambulatory Visit: Payer: Medicare PPO | Attending: Internal Medicine

## 2019-11-24 DIAGNOSIS — Z23 Encounter for immunization: Secondary | ICD-10-CM | POA: Insufficient documentation

## 2019-11-24 NOTE — Progress Notes (Signed)
   Covid-19 Vaccination Clinic  Name:  Robin [name]    MRN: [id number] DOB: 02/28/1941  11/24/2019  [name] was observed post Covid-19 immunization for 15 minutes without incidence. She was provided with Vaccine Information Sheet and instruction to access the V-Safe system.   [name] was instructed to call 911 with any severe reactions post vaccine: Marland Kitchen Difficulty breathing  . Swelling of your face and throat  . A fast heartbeat  . A bad rash all over your body  . Dizziness and weakness    Immunizations Administered    Name Date Dose VIS Date Route   Pfizer COVID-19 Vaccine 11/24/2019  5:30 PM 0.3 mL 09/05/2019 Intramuscular   Manufacturer: [place]   Lot: [id number]   [place]: [id number]

## 2019-12-01 ENCOUNTER — Inpatient Hospital Stay: Payer: Medicare PPO | Attending: Hematology & Oncology

## 2019-12-01 ENCOUNTER — Inpatient Hospital Stay (HOSPITAL_BASED_OUTPATIENT_CLINIC_OR_DEPARTMENT_OTHER): Payer: Medicare PPO | Admitting: Hematology & Oncology

## 2019-12-01 ENCOUNTER — Other Ambulatory Visit: Payer: Self-pay

## 2019-12-01 ENCOUNTER — Encounter: Payer: Self-pay | Admitting: Hematology & Oncology

## 2019-12-01 VITALS — BP 163/58 | HR 52 | Temp 97.1°F | Resp 17 | Wt 150.0 lb

## 2019-12-01 DIAGNOSIS — C184 Malignant neoplasm of transverse colon: Secondary | ICD-10-CM

## 2019-12-01 DIAGNOSIS — Z85038 Personal history of other malignant neoplasm of large intestine: Secondary | ICD-10-CM | POA: Insufficient documentation

## 2019-12-01 DIAGNOSIS — Z9049 Acquired absence of other specified parts of digestive tract: Secondary | ICD-10-CM | POA: Insufficient documentation

## 2019-12-01 DIAGNOSIS — Z9221 Personal history of antineoplastic chemotherapy: Secondary | ICD-10-CM | POA: Diagnosis not present

## 2019-12-01 DIAGNOSIS — R197 Diarrhea, unspecified: Secondary | ICD-10-CM | POA: Insufficient documentation

## 2019-12-01 LAB — CBC WITH DIFFERENTIAL (CANCER CENTER ONLY)
Abs Immature Granulocytes: 0.01 10*3/uL (ref 0.00–0.07)
Basophils Absolute: 0 10*3/uL (ref 0.0–0.1)
Basophils Relative: 0 %
Eosinophils Absolute: 0.2 10*3/uL (ref 0.0–0.5)
Eosinophils Relative: 4 %
HCT: 35.2 % — ABNORMAL LOW (ref 36.0–46.0)
Hemoglobin: 11.8 g/dL — ABNORMAL LOW (ref 12.0–15.0)
Immature Granulocytes: 0 %
Lymphocytes Relative: 37 %
Lymphs Abs: 2.3 10*3/uL (ref 0.7–4.0)
MCH: 29.9 pg (ref 26.0–34.0)
MCHC: 33.5 g/dL (ref 30.0–36.0)
MCV: 89.3 fL (ref 80.0–100.0)
Monocytes Absolute: 0.6 10*3/uL (ref 0.1–1.0)
Monocytes Relative: 10 %
Neutro Abs: 3 10*3/uL (ref 1.7–7.7)
Neutrophils Relative %: 49 %
Platelet Count: 201 10*3/uL (ref 150–400)
RBC: 3.94 MIL/uL (ref 3.87–5.11)
RDW: 12.6 % (ref 11.5–15.5)
WBC Count: 6.1 10*3/uL (ref 4.0–10.5)
nRBC: 0 % (ref 0.0–0.2)

## 2019-12-01 LAB — CMP (CANCER CENTER ONLY)
ALT: 31 U/L (ref 0–44)
AST: 20 U/L (ref 15–41)
Albumin: 4.8 g/dL (ref 3.5–5.0)
Alkaline Phosphatase: 41 U/L (ref 38–126)
Anion gap: 8 (ref 5–15)
BUN: 20 mg/dL (ref 8–23)
CO2: 30 mmol/L (ref 22–32)
Calcium: 10.7 mg/dL — ABNORMAL HIGH (ref 8.9–10.3)
Chloride: 99 mmol/L (ref 98–111)
Creatinine: 1.1 mg/dL — ABNORMAL HIGH (ref 0.44–1.00)
GFR, Est AFR Am: 55 mL/min — ABNORMAL LOW (ref >60)
GFR, Estimated: 48 mL/min — ABNORMAL LOW (ref >60)
Glucose, Bld: 120 mg/dL — ABNORMAL HIGH (ref 70–99)
Potassium: 4.4 mmol/L (ref 3.5–5.1)
Sodium: 137 mmol/L (ref 135–145)
Total Bilirubin: 0.7 mg/dL (ref 0.3–1.2)
Total Protein: 7 g/dL (ref 6.5–8.1)

## 2019-12-01 LAB — CEA (IN HOUSE-CHCC): CEA (CHCC-In House): 1.92 ng/mL (ref 0.00–5.00)

## 2019-12-01 NOTE — Progress Notes (Signed)
Hematology and Oncology Follow Up Visit  ALFIE ALDERFER [id number] 1941-08-07 79 y.o. 12/01/2019   Principle Diagnosis:  History of locally recurrent adenocarcinoma of the colon  Current Therapy:   Observation    Interim History:  Ms. Duquette is here today for follow-up.  There is still a question of her having systemic mastocytosis.  We actually did a bone marrow biopsy on her last year.  This is actually done I think back in November 2019.  This did not show any evidence of mastocytosis in the bone marrow.  Her lab work shows that she has a mildly elevated tryptase level at 23.4.  She had a skin lesion that was biopsied last week by me her dermatologist.  The results are not back yet.  She has had a little bit of diarrhea.  She is on medication for this right now.  She is not sure what the medication is.   There is been no fever.  She has had no cough.  There is no bleeding.  She has had no leg swelling.  There is been no rashes.  Overall, her performance status is ECOG 1.    Medications:  Allergies as of 12/01/2019      Reactions   Statins    Aches in her bones      Medication List       Accurate as of December 01, 2019 11:11 AM. If you have any questions, ask your nurse or doctor.        amLODipine 5 MG tablet Commonly known as: NORVASC Take 5 mg by mouth Daily.   aspirin 81 MG tablet Take 81 mg by mouth daily.   beta carotene w/minerals tablet Take 1 tablet by mouth daily.   cetirizine 10 MG tablet Commonly known as: ZYRTEC Take 20 mg by mouth daily.   ciprofloxacin 500 MG tablet Commonly known as: Cipro Take 1 tablet (500 mg total) by mouth 2 (two) times daily.   famotidine 20 MG tablet Commonly known as: PEPCID Take 20 mg by mouth 2 (two) times daily.   fexofenadine 180 MG tablet Commonly known as: ALLEGRA Take 1 tablet (180 mg total) by mouth 2 (two) times daily.   Fluocinolone Acetonide Body 0.01 % Oil APPLY OIL TOPICALLY TO AFFECTED AREA TWICE  DAILY AS NEEDED (APPLY SPARINGLY)   FLUoxetine 20 MG capsule Commonly known as: PROZAC Take 20 mg by mouth Daily.   fluticasone 50 MCG/ACT nasal spray Commonly known as: FLONASE Place 1 spray into both nostrils daily.   Glucosamine HCl 1500 MG Tabs Take 1,500 mg by mouth daily.   glucose blood test strip   hydrOXYzine 25 MG tablet Commonly known as: ATARAX/VISTARIL Take 25 mg by mouth at bedtime.   levocetirizine 5 MG tablet Commonly known as: XYZAL Take 10 mg by mouth daily. Take 2 tablets by mouth once daily in the morning.   levothyroxine 112 MCG tablet Commonly known as: SYNTHROID Take 112 mcg by mouth daily before breakfast.   metFORMIN 500 MG 24 hr tablet Commonly known as: Glucophage XR take twice a day, with meals.   metroNIDAZOLE 500 MG tablet Commonly known as: FLAGYL Take 1 tablet (500 mg total) by mouth 3 (three) times daily.   multivitamin capsule Take 1 capsule by mouth daily.   omeprazole 20 MG capsule Commonly known as: PRILOSEC Take 20 mg by mouth daily as needed.   OneTouch Delica Plus [id number] Misc USE TO CHECK YOUR BLOOD SUGAR ONCE OR TWICE DAILY  polyvinyl alcohol 1.4 % ophthalmic solution Commonly known as: LIQUIFILM TEARS Place 1 drop into both eyes as needed for dry eyes.   Probiotic Daily Caps Take 1 capsule by mouth every morning.   triamterene-hydrochlorothiazide 37.5-25 MG capsule Commonly known as: DYAZIDE Take 1 capsule by mouth Daily.   VITAMIN D-3 PO Take 2,000 Units by mouth daily.       Allergies:  Allergies  Allergen Reactions  . Statins     Aches in her bones    Past Medical History, Surgical history, Social history, and Family History were reviewed and updated.  Review of Systems: Review of Systems  Constitutional: Negative.   HENT: Negative.   Eyes: Negative.   Respiratory: Negative.   Cardiovascular: Negative.   Gastrointestinal: Negative.   Genitourinary: Negative.   Musculoskeletal: Negative.     Skin: Negative.   Endo/Heme/Allergies: Negative.   Psychiatric/Behavioral: Negative.     Physical Exam:  weight is 150 lb (68 kg). Her temporal temperature is 97.1 F (36.2 C) (abnormal). Her blood pressure is 163/58 (abnormal) and her pulse is 52 (abnormal). Her respiration is 17 and oxygen saturation is 100%.   Wt Readings from Last 3 Encounters:  12/01/19 150 lb (68 kg)  08/14/19 155 lb (70.3 kg)  05/01/19 153 lb 6.4 oz (69.6 kg)    Physical Exam Vitals reviewed.  HENT:     Head: Normocephalic and atraumatic.  Eyes:     Pupils: Pupils are equal, round, and reactive to light.  Cardiovascular:     Rate and Rhythm: Normal rate and regular rhythm.     Heart sounds: Normal heart sounds.  Pulmonary:     Effort: Pulmonary effort is normal.     Breath sounds: Normal breath sounds.  Abdominal:     General: Bowel sounds are normal.     Palpations: Abdomen is soft.  Musculoskeletal:        General: No tenderness or deformity. Normal range of motion.     Cervical back: Normal range of motion.  Lymphadenopathy:     Cervical: No cervical adenopathy.  Skin:    General: Skin is warm and dry.     Findings: No erythema or rash.  Neurological:     Mental Status: She is alert and oriented to person, place, and time.  Psychiatric:        Behavior: Behavior normal.        Thought Content: Thought content normal.        Judgment: Judgment normal.      Lab Results  Component Value Date   WBC 6.1 12/01/2019   HGB 11.8 (L) 12/01/2019   HCT 35.2 (L) 12/01/2019   MCV 89.3 12/01/2019   PLT 201 12/01/2019   Lab Results  Component Value Date   FERRITIN 250 10/14/2009   Lab Results  Component Value Date   RETICCTPCT 1.2 12/17/2008   RBC 3.94 12/01/2019   RETICCTABS 48.7 12/17/2008   No results found for: KPAFRELGTCHN, LAMBDASER, KAPLAMBRATIO No results found for: IGGSERUM, IGA, IGMSERUM No results found for: Odetta Pink,  SPEI   Chemistry      Component Value Date/Time   NA 137 12/01/2019 1002   NA 142 03/22/2018 1059   NA 139 05/21/2017 0838   K 4.4 12/01/2019 1002   K 4.3 05/21/2017 0838   CL 99 12/01/2019 1002   CL 102 05/18/2016 1505   CL 100 11/01/2015 0932   CO2 30 12/01/2019 1002   CO2  28 05/21/2017 0838   BUN 20 12/01/2019 1002   BUN 13 03/22/2018 1059   BUN 20.8 05/21/2017 0838   CREATININE 1.10 (H) 12/01/2019 1002   CREATININE 1.0 05/21/2017 0838      Component Value Date/Time   CALCIUM 10.7 (H) 12/01/2019 1002   CALCIUM 10.5 (H) 05/21/2017 0838   ALKPHOS 41 12/01/2019 1002   ALKPHOS 49 05/21/2017 0838   AST 20 12/01/2019 1002   AST 22 05/21/2017 0838   ALT 31 12/01/2019 1002   ALT 33 05/21/2017 0838   BILITOT 0.7 12/01/2019 1002   BILITOT 0.57 05/21/2017 0838      Impression and Plan: Ms. Pylant is a very pleasant 79 yo caucasian female with history of locally recurrent adenocarcinoma of the colon with resection in 2000. She received adjuvant FOLFOX.  I see no evidence of recurrent disease.  Thankfully, I do not see anything that looks like mastocytosis.  We will have to see what the skin biopsy shows.  We saw her 6 months ago.  I think we can probably get her back in 6 months.  I will see any issues with this.  As far as her have any problems with colon cancer, this really should not be an issue.  I forgot to mention that she has had her coronavirus vaccines.   [name], MD 3/8/202111:11 AM

## 2019-12-27 ENCOUNTER — Other Ambulatory Visit: Payer: Self-pay | Admitting: Internal Medicine

## 2020-01-15 DIAGNOSIS — L658 Other specified nonscarring hair loss: Secondary | ICD-10-CM | POA: Diagnosis not present

## 2020-01-15 DIAGNOSIS — L821 Other seborrheic keratosis: Secondary | ICD-10-CM | POA: Diagnosis not present

## 2020-01-15 DIAGNOSIS — Z85828 Personal history of other malignant neoplasm of skin: Secondary | ICD-10-CM | POA: Diagnosis not present

## 2020-01-15 DIAGNOSIS — L578 Other skin changes due to chronic exposure to nonionizing radiation: Secondary | ICD-10-CM | POA: Diagnosis not present

## 2020-01-15 DIAGNOSIS — Z86018 Personal history of other benign neoplasm: Secondary | ICD-10-CM | POA: Diagnosis not present

## 2020-01-15 DIAGNOSIS — L650 Telogen effluvium: Secondary | ICD-10-CM | POA: Diagnosis not present

## 2020-01-28 ENCOUNTER — Other Ambulatory Visit: Payer: Self-pay | Admitting: Internal Medicine

## 2020-01-30 DIAGNOSIS — K5229 Other allergic and dietetic gastroenteritis and colitis: Secondary | ICD-10-CM | POA: Diagnosis not present

## 2020-01-30 DIAGNOSIS — K52832 Lymphocytic colitis: Secondary | ICD-10-CM | POA: Diagnosis not present

## 2020-01-30 DIAGNOSIS — R748 Abnormal levels of other serum enzymes: Secondary | ICD-10-CM | POA: Diagnosis not present

## 2020-02-12 ENCOUNTER — Encounter: Payer: Self-pay | Admitting: Internal Medicine

## 2020-02-12 ENCOUNTER — Ambulatory Visit: Payer: Medicare PPO | Admitting: Internal Medicine

## 2020-02-12 ENCOUNTER — Other Ambulatory Visit: Payer: Self-pay

## 2020-02-12 VITALS — BP 160/70 | HR 58 | Ht 61.0 in | Wt 154.0 lb

## 2020-02-12 DIAGNOSIS — E1169 Type 2 diabetes mellitus with other specified complication: Secondary | ICD-10-CM | POA: Diagnosis not present

## 2020-02-12 DIAGNOSIS — E049 Nontoxic goiter, unspecified: Secondary | ICD-10-CM | POA: Diagnosis not present

## 2020-02-12 DIAGNOSIS — E1121 Type 2 diabetes mellitus with diabetic nephropathy: Secondary | ICD-10-CM

## 2020-02-12 DIAGNOSIS — E785 Hyperlipidemia, unspecified: Secondary | ICD-10-CM | POA: Diagnosis not present

## 2020-02-12 LAB — LIPID PANEL
Cholesterol: 210 mg/dL — ABNORMAL HIGH (ref 0–200)
HDL: 44.6 mg/dL (ref >39.00)
NonHDL: 165.41
Total CHOL/HDL Ratio: 5
Triglycerides: 253 mg/dL — ABNORMAL HIGH (ref 0.0–149.0)
VLDL: 50.6 mg/dL — ABNORMAL HIGH (ref 0.0–40.0)

## 2020-02-12 LAB — TSH: TSH: 0.84 u[IU]/mL (ref 0.35–4.50)

## 2020-02-12 LAB — POCT GLYCOSYLATED HEMOGLOBIN (HGB A1C): Hemoglobin A1C: 6 % — AB (ref 4.0–5.6)

## 2020-02-12 LAB — LDL CHOLESTEROL, DIRECT: Direct LDL: 134 mg/dL

## 2020-02-12 MED ORDER — METFORMIN HCL ER 500 MG PO TB24: 500.0000 mg | ORAL_TABLET | Freq: Two times a day (BID) | ORAL | 3 refills | Status: DC

## 2020-02-12 NOTE — Progress Notes (Signed)
Patient ID: Robin [name], female   DOB: 10-04-1940, 79 y.o.   MRN: [id number]  This visit occurred during the SARS-CoV-2 public health emergency.  Safety protocols were in place, including screening questions prior to the visit, additional usage of staff PPE, and extensive cleaning of exam room while observing appropriate contact time as indicated for disinfecting solutions.   HPI: Robin [name] is a 79 y.o.-year-old female, returning for f/u for DM2, initially GDM, then DM2 dx in 05/2015, non-insulin-dependent, controlled, with complications (mild CKD). Last visit 6 months ago.  Reviewed HbA1c levels:: Lab Results  Component Value Date   HGBA1C 6.2 (A) 08/14/2019   HGBA1C 6.5 (A) 02/11/2019   HGBA1C 6.4 (A) 08/12/2018   Pt is on a regimen of: - Metformin ER 1000 mg with dinner -she had diarrhea with higher doses >> 500 mg twice a day  Pt checks her sugars once a day per review of her meter downloads: - am:156 >> n/c >> 120-130 >> 144 >> n/c >> 186 x1 >> n/c >> 123 - 2h after b'fast: n/c >> 74 - before lunch: 92 >> n/c >> 82-120, 218 >> n/c >> 86-98 >> 58,  90-95 - 2h after lunch: 183-105, 154 >> n/c >> 79, 86 >> 145 - before dinner:91, 104-146, 186 >> 104-120, 174, 183 (apple) >> 94-122, 159 - 2h after dinner:  83 >> 120-130 >> n/c - bedtime: 115, 116 >> 94  >> n/c - nighttime: n/c Lowest sugar was 70s >> 82 >> 91 >> 79 >> 58; she has hypoglycemia awareness in the 70s. Highest sugar was 145 >> 218 (steroids) >> 186 >> 159.  Glucometer: One Touch  Pt's meals are: - Breakfast: cheerios + raisin bran or cornflakes + 2% milk or bacon + egg + toast - Lunch: salad or soup or sandwich - Dinner: veggies + starch - Snacks: no desserts; apple, chips She saw a dietitian 07/09/2015-this helped a lot. In 2019 had a pruritic maculopapular generalized rash. She saw PCP and dermatology and no etiology was found. She then saw Dr. Marin Olp - suspicion for mastocytosis  - BM Bx negative.  She  found out she is allergic to meat.  She stopped eating meat and the rash resolved.  Of note, alpha gal mutation was negative. She is now investigated for possible gall stones.   + Mild CKD, last BUN/creatinine:  Lab Results  Component Value Date   BUN 20 12/01/2019   CREATININE 1.10 (H) 12/01/2019  10/22/2018: ACR 11.8  + HL; last set of lipids: 10/22/2018: 228/352/39/119 Recent lipids: TG 600 >> started Fenofibrate >> TG 200s 10/10/2016: 246/381/40/130   Lab Results  Component Value Date   CHOL 208 (H) 11/01/2015   HDL 40 11/01/2015   LDLCALC 131 (H) 11/01/2015   TRIG 183 (H) 11/01/2015   CHOLHDL 5.2 (H) 11/01/2015  She tried statins >> Bone pain .  She tried Benecol spread but did not like it.  She also tried fenofibrate >> rash.  - last eye exam was on 03/11/2019: No DR  -No numbness and tingling in her feet.  She also has HTN, OSA, depression, osteoarthritis, spinal stenosis, osteopenia, GERD, CKD stage III. + h/o colon cancer.  She has hypothyroidism, managed by PCP.  Latest TSH was normal: 10/22/2018: TSH 0.76 Lab Results  Component Value Date   TSH 2.140 03/22/2018   Pt is on levothyroxine 112 mcg daily, taken: - in am - fasting - at least 1h from b'fast - no Ca, Fe,  MVI, PPIs, + Pepcid 1h after b'fast - not on Biotin  In 2019 she was found to have elevated ATA antibodies, giving her a diagnosis of Hashimoto's thyroiditis: Lab Results  Component Value Date   THGAB 93.8 (H) 03/22/2018   At last visit, we checked a thyroid ultrasound as she had a prominent left thyroid lobe on palpation: Thyroid U/S (03/03/2019): no nodules, heterogeneity and atrophy  ROS: Constitutional: no weight gain/no weight loss, no fatigue, no subjective hyperthermia, no subjective hypothermia Eyes: no blurry vision, no xerophthalmia ENT: no sore throat, no nodules palpated in neck, no dysphagia, no odynophagia, no hoarseness Cardiovascular: no CP/no SOB/no palpitations/no leg  swelling Respiratory: no cough/no SOB/no wheezing Gastrointestinal: no N/no V/no D/no C/no acid reflux Musculoskeletal: no muscle aches/no joint aches Skin: no rashes, no hair loss Neurological: no tremors/no numbness/no tingling/no dizziness  I reviewed pt's medications, allergies, PMH, social hx, family hx, and changes were documented in the history of present illness. Otherwise, unchanged from my initial visit note.   Past Medical History:  Diagnosis Date  . Arthritis   . Colon cancer ([place])    colon ca dx 2008 and 2010  . Complication of anesthesia    problems waking up-patient thinks she was given too much medication  . Gestational diabetes   . Headache    headaches all the time  . Hypertension   . Sleep apnea    uses CPAP  . Urticaria    Past Surgical History:  Procedure Laterality Date  . ABDOMINAL HYSTERECTOMY    . APPENDECTOMY    . BACK SURGERY  2009  . CARPAL TUNNEL RELEASE    . COLON SURGERY    . COLONOSCOPY WITH PROPOFOL N/A 08/10/2015   Procedure: COLONOSCOPY WITH PROPOFOL;  Surgeon: Garlan Fair, MD;  Location: [facility];  Service: Endoscopy;  Laterality: N/A;  . DILATION AND CURETTAGE OF UTERUS     had before had hysterectomy  . ESOPHAGOGASTRODUODENOSCOPY (EGD) WITH PROPOFOL N/A 08/10/2015   Procedure: ESOPHAGOGASTRODUODENOSCOPY (EGD) WITH PROPOFOL;  Surgeon: Garlan Fair, MD;  Location: [facility];  Service: Endoscopy;  Laterality: N/A;  . EYE SURGERY     bilateral cataract surgery   . ROTATOR CUFF REPAIR     Social History   Socioeconomic History  . Marital status: Divorced    Spouse name: Not on file  . Number of children: 4  . Years of education: 12+  . Highest education level: Not on file  Occupational History  . 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 Smoking status: Never Smoker  . Smokeless tobacco: Never Used  . Tobacco comment: never used tobacco  Substance and Sexual Activity  . Alcohol use: No    Alcohol/week: 0.0  standard drinks  . Drug use: No  . Sexual activity: Not on file  Other Topics Concern  . Not on file  Social History Narrative   Lives at home with herself.   Caffeine use: Drinks coffee and tea (1 cup coffee per day). Tea rarely    Social Determinants of Health   Financial Resource Strain:   . Difficulty of Paying Living Expenses:   Food Insecurity:   . Worried About Charity fundraiser in the Last Year:   . Arboriculturist in the Last Year:   Transportation Needs:   . Film/video editor (Medical):   Marland Kitchen Lack of Transportation (Non-Medical):   Physical Activity:   . Days of Exercise per Week:   . Minutes  of Exercise per Session:   Stress:   . Feeling of Stress :   Social Connections:   . Frequency of Communication with Friends and Family:   . Frequency of Social Gatherings with Friends and Family:   . Attends Religious Services:   . Active Member of Clubs or Organizations:   . Attends Archivist Meetings:   Marland Kitchen Marital Status:   Intimate Partner Violence:   . Fear of Current or Ex-Partner:   . Emotionally Abused:   Marland Kitchen Physically Abused:   . Sexually Abused:    Current Outpatient Medications on File Prior to Visit  Medication Sig Dispense Refill  . amLODipine (NORVASC) 5 MG tablet Take 5 mg by mouth Daily.    Marland Kitchen aspirin 81 MG tablet Take 81 mg by mouth daily.    . beta carotene w/minerals (OCUVITE) tablet Take 1 tablet by mouth daily.    . cetirizine (ZYRTEC) 10 MG tablet Take 20 mg by mouth daily.  1  . Cholecalciferol (VITAMIN D-3 PO) Take 2,000 Units by mouth daily.    . ciprofloxacin (CIPRO) 500 MG tablet Take 1 tablet (500 mg total) by mouth 2 (two) times daily. 20 tablet 0  . famotidine (PEPCID) 20 MG tablet Take 20 mg by mouth 2 (two) times daily.    . fexofenadine (ALLEGRA) 180 MG tablet Take 1 tablet (180 mg total) by mouth 2 (two) times daily. 60 tablet 3  . Fluocinolone Acetonide Body 0.01 % OIL APPLY OIL TOPICALLY TO AFFECTED AREA TWICE DAILY AS  NEEDED (APPLY SPARINGLY)    . FLUoxetine (PROZAC) 20 MG capsule Take 20 mg by mouth Daily.    . fluticasone (FLONASE) 50 MCG/ACT nasal spray Place 1 spray into both nostrils daily.    . Glucosamine HCl 1500 MG TABS Take 1,500 mg by mouth daily.     Marland Kitchen glucose blood test strip     . hydrOXYzine (ATARAX/VISTARIL) 25 MG tablet Take 25 mg by mouth at bedtime.  5  . Lancets (ONETOUCH DELICA PLUS [id number]) MISC USE TO CHECK YOUR BLOOD SUGAR ONCE OR TWICE DAILY    . levocetirizine (XYZAL) 5 MG tablet Take 10 mg by mouth daily. Take 2 tablets by mouth once daily in the morning.    Marland Kitchen levothyroxine (SYNTHROID, LEVOTHROID) 112 MCG tablet Take 112 mcg by mouth daily before breakfast.     . metFORMIN (GLUCOPHAGE-XR) 500 MG 24 hr tablet TAKE 1 TABLET BY MOUTH TWICE DAILY WITH MEALS 60 tablet 0  . metroNIDAZOLE (FLAGYL) 500 MG tablet Take 1 tablet (500 mg total) by mouth 3 (three) times daily. (Patient not taking: Reported on 08/14/2019) 30 tablet 0  . Multiple Vitamin (MULTIVITAMIN) capsule Take 1 capsule by mouth daily.    Marland Kitchen omeprazole (PRILOSEC) 20 MG capsule Take 20 mg by mouth daily as needed.    . polyvinyl alcohol (LIQUIFILM TEARS) 1.4 % ophthalmic solution Place 1 drop into both eyes as needed for dry eyes.    . Probiotic Product (PROBIOTIC DAILY) CAPS Take 1 capsule by mouth every morning.     . triamterene-hydrochlorothiazide (DYAZIDE) 37.5-25 MG per capsule Take 1 capsule by mouth Daily.      No current facility-administered medications on file prior to visit.   Allergies  Allergen Reactions  . Statins     Aches in her bones   Family History  Problem Relation Age of Onset  . Migraines Neg Hx    PE: BP (!) 160/70 Comment: patient states she has not taken  her med today  Pulse (!) 58   Ht 5\' 1"  (1.549 m)   Wt 154 lb (69.9 kg)   SpO2 99%   BMI 29.10 kg/m  Body mass index is 29.1 kg/m. Wt Readings from Last 3 Encounters:  02/12/20 154 lb (69.9 kg)  12/01/19 150 lb (68 kg)  08/14/19 155  lb (70.3 kg)   Constitutional: Slightly overweight, in NAD Eyes: PERRLA, EOMI, no exophthalmos ENT: moist mucous membranes, + L thyroid fullness, no cervical lymphadenopathy Cardiovascular: RRR, No MRG Respiratory: CTA B Gastrointestinal: abdomen soft, NT, ND, BS+ Musculoskeletal: no deformities, strength intact in all 4 Skin: moist, warm, no rashes Neurological: no tremor with outstretched hands, DTR normal in all 4  ASSESSMENT: 1. DM2, non-insulin-dependent, controlled, with long-term complications -Mild CKD  2. HL  3.  Enlarged thyroid - L  PLAN:  1. Patient with history of controlled diabetes, on oral antidiabetic regimen with half maximal Metformin dose.  We could not increase the dose due to diarrhea.  Latest HbA1c was at goal, at 6.2% 6 months ago. -At last visit, sugars are mostly at goal with only few hyperglycemic excursions and, upon questioning, this sugars were checked after a snack.  We did not change her regimen.  She did have some nausea at that time, which was investigated by GI.  She did not feel that Metformin ER was the cause for the nausea.  She is seeing the doctor for suspicion for mastocytosis.  She was started on medication.  She is also on Pepcid. -At this visit, sugars are almost all at goal.  She did have a low blood sugar at 58, but she does not remember what happened before this.  She also had a slightly high blood sugar in the 150s.  Otherwise, sugars are controlled so we can continue the current dose of Metformin. - I suggested to:  Patient Instructions  Please continue Metformin ER 500 mg 2x a day  Please return in 6 months with your sugar log.   - we checked her HbA1c: 6% (better) - advised to check sugars at different times of the day - 1x a day, rotating check times - advised for yearly eye exams >> she is UTD - return to clinic in 6 months  2. HL -Reviewed latest lipid panel from 09/2018: LDL cholesterol and triglycerides high -She is  intolerant to statins.  Also, she developed a rash to fenofibrate. -She is due for another lipid panel -we will check this today   3. Enlarged left thyroid -In the past, her left thyroid lobe appeared more prominent on palpation so we checked a thyroid ultrasound.  This was negative for nodules.  She does have a diagnosis of Hashimoto's thyroiditis, based on elevated ATA antibodies.. -She denies neck compression symptoms - latest thyroid labs reviewed with pt >> normal: Lab Results  Component Value Date   TSH 2.140 03/22/2018   - she continues on LT4 112 mcg daily - pt feels good on this dose. - we discussed about taking the thyroid hormone every day, with water, >30 minutes before breakfast, separated by >4 hours from acid reflux medications, calcium, iron, multivitamins. Pt. is taking it correctly, except, she takes Pepcid 2 hours after levothyroxine.  We discussed that for now, it is okay to continue but if TSH is elevated, we may need to move biopsy defibrillator. - will check thyroid tests today: TSH  - If labs are abnormal, she will need to return for repeat TFTs in 1.5  months  Office Visit on 02/12/2020  Component Date Value Ref Range Status  . Cholesterol 02/12/2020 210* 0 - 200 mg/dL Final   ATP III Classification       Desirable:  < 200 mg/dL               Borderline High:  200 - 239 mg/dL          High:  > = 240 mg/dL  . Triglycerides 02/12/2020 253.0* 0.0 - 149.0 mg/dL Final   Normal:  <150 mg/dLBorderline High:  150 - 199 mg/dL  . HDL 02/12/2020 44.60  >39.00 mg/dL Final  . VLDL 02/12/2020 50.6* 0.0 - 40.0 mg/dL Final  . Total CHOL/HDL Ratio 02/12/2020 5   Final                  Men          Women1/2 Average Risk     3.4          3.3Average Risk          5.0          4.42X Average Risk          9.6          7.13X Average Risk          15.0          11.0                      . NonHDL 02/12/2020 165.41   Final   NOTE:  Non-HDL goal should be 30 mg/dL higher than patient's LDL  goal (i.e. LDL goal of < 70 mg/dL, would have non-HDL goal of < 100 mg/dL)  . TSH 02/12/2020 0.84  0.35 - 4.50 uIU/mL Final  . Hemoglobin A1C 02/12/2020 6.0* 4.0 - 5.6 % Final  . Direct LDL 02/12/2020 134.0  mg/dL Final   Optimal:  <100 mg/dLNear or Above Optimal:  100-129 mg/dLBorderline High:  130-159 mg/dLHigh:  160-189 mg/dLVery High:  >190 mg/dL   LDL is worse, triglycerides better.  They are still high, though.  Will discuss with her if she wants to try fish oil to improve her triglycerides.  Also, will check with her if she accepts a referral to nutrition to hopefully help with LDL lowering. TSH is normal.  Philemon Kingdom, MD PhD [facility] Endocrinology

## 2020-02-12 NOTE — Patient Instructions (Signed)
Please continue: - Metformin ER 500 mg 2x a day  Please return in 6 months with your sugar log.  

## 2020-03-01 DIAGNOSIS — L298 Other pruritus: Secondary | ICD-10-CM | POA: Diagnosis not present

## 2020-03-01 DIAGNOSIS — S76219A Strain of adductor muscle, fascia and tendon of unspecified thigh, initial encounter: Secondary | ICD-10-CM | POA: Diagnosis not present

## 2020-03-05 DIAGNOSIS — M25552 Pain in left hip: Secondary | ICD-10-CM | POA: Diagnosis not present

## 2020-03-05 DIAGNOSIS — S76219D Strain of adductor muscle, fascia and tendon of unspecified thigh, subsequent encounter: Secondary | ICD-10-CM | POA: Diagnosis not present

## 2020-03-05 DIAGNOSIS — M79605 Pain in left leg: Secondary | ICD-10-CM | POA: Diagnosis not present

## 2020-03-09 DIAGNOSIS — M79605 Pain in left leg: Secondary | ICD-10-CM | POA: Diagnosis not present

## 2020-03-09 DIAGNOSIS — S76219D Strain of adductor muscle, fascia and tendon of unspecified thigh, subsequent encounter: Secondary | ICD-10-CM | POA: Diagnosis not present

## 2020-03-09 DIAGNOSIS — M25552 Pain in left hip: Secondary | ICD-10-CM | POA: Diagnosis not present

## 2020-03-11 DIAGNOSIS — S76219D Strain of adductor muscle, fascia and tendon of unspecified thigh, subsequent encounter: Secondary | ICD-10-CM | POA: Diagnosis not present

## 2020-03-11 DIAGNOSIS — M79605 Pain in left leg: Secondary | ICD-10-CM | POA: Diagnosis not present

## 2020-03-11 DIAGNOSIS — M25552 Pain in left hip: Secondary | ICD-10-CM | POA: Diagnosis not present

## 2020-03-16 DIAGNOSIS — S76219D Strain of adductor muscle, fascia and tendon of unspecified thigh, subsequent encounter: Secondary | ICD-10-CM | POA: Diagnosis not present

## 2020-03-16 DIAGNOSIS — M25552 Pain in left hip: Secondary | ICD-10-CM | POA: Diagnosis not present

## 2020-03-16 DIAGNOSIS — M79605 Pain in left leg: Secondary | ICD-10-CM | POA: Diagnosis not present

## 2020-03-18 DIAGNOSIS — M25552 Pain in left hip: Secondary | ICD-10-CM | POA: Diagnosis not present

## 2020-03-18 DIAGNOSIS — H40013 Open angle with borderline findings, low risk, bilateral: Secondary | ICD-10-CM | POA: Diagnosis not present

## 2020-03-18 DIAGNOSIS — S76219D Strain of adductor muscle, fascia and tendon of unspecified thigh, subsequent encounter: Secondary | ICD-10-CM | POA: Diagnosis not present

## 2020-03-18 DIAGNOSIS — H04123 Dry eye syndrome of bilateral lacrimal glands: Secondary | ICD-10-CM | POA: Diagnosis not present

## 2020-03-18 DIAGNOSIS — E119 Type 2 diabetes mellitus without complications: Secondary | ICD-10-CM | POA: Diagnosis not present

## 2020-03-18 DIAGNOSIS — H35033 Hypertensive retinopathy, bilateral: Secondary | ICD-10-CM | POA: Diagnosis not present

## 2020-03-18 DIAGNOSIS — M79605 Pain in left leg: Secondary | ICD-10-CM | POA: Diagnosis not present

## 2020-03-18 LAB — HM DIABETES EYE EXAM

## 2020-04-01 DIAGNOSIS — S76219D Strain of adductor muscle, fascia and tendon of unspecified thigh, subsequent encounter: Secondary | ICD-10-CM | POA: Diagnosis not present

## 2020-04-01 DIAGNOSIS — M79605 Pain in left leg: Secondary | ICD-10-CM | POA: Diagnosis not present

## 2020-04-01 DIAGNOSIS — M25552 Pain in left hip: Secondary | ICD-10-CM | POA: Diagnosis not present

## 2020-04-06 DIAGNOSIS — M25552 Pain in left hip: Secondary | ICD-10-CM | POA: Diagnosis not present

## 2020-04-06 DIAGNOSIS — S76219D Strain of adductor muscle, fascia and tendon of unspecified thigh, subsequent encounter: Secondary | ICD-10-CM | POA: Diagnosis not present

## 2020-04-06 DIAGNOSIS — M79605 Pain in left leg: Secondary | ICD-10-CM | POA: Diagnosis not present

## 2020-04-08 DIAGNOSIS — S76219D Strain of adductor muscle, fascia and tendon of unspecified thigh, subsequent encounter: Secondary | ICD-10-CM | POA: Diagnosis not present

## 2020-04-08 DIAGNOSIS — M25552 Pain in left hip: Secondary | ICD-10-CM | POA: Diagnosis not present

## 2020-04-08 DIAGNOSIS — M79605 Pain in left leg: Secondary | ICD-10-CM | POA: Diagnosis not present

## 2020-04-12 DIAGNOSIS — M25552 Pain in left hip: Secondary | ICD-10-CM | POA: Diagnosis not present

## 2020-04-12 DIAGNOSIS — I10 Essential (primary) hypertension: Secondary | ICD-10-CM | POA: Diagnosis not present

## 2020-04-13 DIAGNOSIS — S76219D Strain of adductor muscle, fascia and tendon of unspecified thigh, subsequent encounter: Secondary | ICD-10-CM | POA: Diagnosis not present

## 2020-04-13 DIAGNOSIS — M79605 Pain in left leg: Secondary | ICD-10-CM | POA: Diagnosis not present

## 2020-04-13 DIAGNOSIS — M25552 Pain in left hip: Secondary | ICD-10-CM | POA: Diagnosis not present

## 2020-04-15 DIAGNOSIS — M25552 Pain in left hip: Secondary | ICD-10-CM | POA: Diagnosis not present

## 2020-04-15 DIAGNOSIS — M79605 Pain in left leg: Secondary | ICD-10-CM | POA: Diagnosis not present

## 2020-04-15 DIAGNOSIS — S76219D Strain of adductor muscle, fascia and tendon of unspecified thigh, subsequent encounter: Secondary | ICD-10-CM | POA: Diagnosis not present

## 2020-04-23 DIAGNOSIS — M79605 Pain in left leg: Secondary | ICD-10-CM | POA: Diagnosis not present

## 2020-04-23 DIAGNOSIS — S76219D Strain of adductor muscle, fascia and tendon of unspecified thigh, subsequent encounter: Secondary | ICD-10-CM | POA: Diagnosis not present

## 2020-04-23 DIAGNOSIS — M25552 Pain in left hip: Secondary | ICD-10-CM | POA: Diagnosis not present

## 2020-06-01 ENCOUNTER — Other Ambulatory Visit: Payer: Self-pay

## 2020-06-01 ENCOUNTER — Inpatient Hospital Stay: Payer: Medicare PPO

## 2020-06-01 ENCOUNTER — Encounter: Payer: Self-pay | Admitting: Hematology & Oncology

## 2020-06-01 ENCOUNTER — Inpatient Hospital Stay: Payer: Medicare PPO | Attending: Hematology & Oncology | Admitting: Hematology & Oncology

## 2020-06-01 ENCOUNTER — Telehealth: Payer: Self-pay | Admitting: Hematology & Oncology

## 2020-06-01 VITALS — BP 165/63 | HR 58 | Temp 98.6°F | Resp 20 | Wt 156.0 lb

## 2020-06-01 DIAGNOSIS — Z85038 Personal history of other malignant neoplasm of large intestine: Secondary | ICD-10-CM | POA: Insufficient documentation

## 2020-06-01 DIAGNOSIS — Z9221 Personal history of antineoplastic chemotherapy: Secondary | ICD-10-CM | POA: Diagnosis not present

## 2020-06-01 DIAGNOSIS — Z9049 Acquired absence of other specified parts of digestive tract: Secondary | ICD-10-CM | POA: Diagnosis not present

## 2020-06-01 DIAGNOSIS — C184 Malignant neoplasm of transverse colon: Secondary | ICD-10-CM

## 2020-06-01 LAB — LACTATE DEHYDROGENASE: LDH: 138 U/L (ref 98–192)

## 2020-06-01 LAB — CBC WITH DIFFERENTIAL (CANCER CENTER ONLY)
Abs Immature Granulocytes: 0.02 10*3/uL (ref 0.00–0.07)
Basophils Absolute: 0 10*3/uL (ref 0.0–0.1)
Basophils Relative: 1 %
Eosinophils Absolute: 0.2 10*3/uL (ref 0.0–0.5)
Eosinophils Relative: 3 %
HCT: 34.7 % — ABNORMAL LOW (ref 36.0–46.0)
Hemoglobin: 11.6 g/dL — ABNORMAL LOW (ref 12.0–15.0)
Immature Granulocytes: 0 %
Lymphocytes Relative: 36 %
Lymphs Abs: 2.3 10*3/uL (ref 0.7–4.0)
MCH: 30.2 pg (ref 26.0–34.0)
MCHC: 33.4 g/dL (ref 30.0–36.0)
MCV: 90.4 fL (ref 80.0–100.0)
Monocytes Absolute: 0.5 10*3/uL (ref 0.1–1.0)
Monocytes Relative: 9 %
Neutro Abs: 3.2 10*3/uL (ref 1.7–7.7)
Neutrophils Relative %: 51 %
Platelet Count: 206 10*3/uL (ref 150–400)
RBC: 3.84 MIL/uL — ABNORMAL LOW (ref 3.87–5.11)
RDW: 12.9 % (ref 11.5–15.5)
WBC Count: 6.3 10*3/uL (ref 4.0–10.5)
nRBC: 0 % (ref 0.0–0.2)

## 2020-06-01 LAB — CMP (CANCER CENTER ONLY)
ALT: 28 U/L (ref 0–44)
AST: 20 U/L (ref 15–41)
Albumin: 4.3 g/dL (ref 3.5–5.0)
Alkaline Phosphatase: 38 U/L (ref 38–126)
Anion gap: 9 (ref 5–15)
BUN: 16 mg/dL (ref 8–23)
CO2: 29 mmol/L (ref 22–32)
Calcium: 9.9 mg/dL (ref 8.9–10.3)
Chloride: 99 mmol/L (ref 98–111)
Creatinine: 1.04 mg/dL — ABNORMAL HIGH (ref 0.44–1.00)
GFR, Est AFR Am: 59 mL/min — ABNORMAL LOW (ref >60)
GFR, Estimated: 51 mL/min — ABNORMAL LOW (ref >60)
Glucose, Bld: 139 mg/dL — ABNORMAL HIGH (ref 70–99)
Potassium: 4.1 mmol/L (ref 3.5–5.1)
Sodium: 137 mmol/L (ref 135–145)
Total Bilirubin: 0.4 mg/dL (ref 0.3–1.2)
Total Protein: 6.5 g/dL (ref 6.5–8.1)

## 2020-06-01 NOTE — Progress Notes (Signed)
Hematology and Oncology Follow Up Visit  Robin [name] [id number] 1941-06-15 79 y.o. 06/01/2020   Principle Diagnosis:  History of locally recurrent adenocarcinoma of the colon  Current Therapy:   Observation    Interim History:  Robin [name] is here today for follow-up.  Overall, things going quite well for her.  We last saw her back in March.  Since then, she has had no issues.  Her 79th birthday is coming up in a month.  She had a very nice Labor Day weekend.  She has had no problems with itching.  There is been no problems with bowels or bladder.  She has had no fever.  She has been very diligent with the coronavirus.  She had no headache.  She has had no visual changes.  There is been no leg swelling.  She has had no rashes.    Overall, her performance status is ECOG 1.    Medications:  Allergies as of 06/01/2020      Reactions   Statins    Aches in her bones      Medication List       Accurate as of June 01, 2020 11:23 AM. If you have any questions, ask your nurse or doctor.        STOP taking these medications   levocetirizine 5 MG tablet Commonly known as: XYZAL Stopped by: [name], MD     TAKE these medications   amLODipine 5 MG tablet Commonly known as: NORVASC Take 5 mg by mouth Daily.   aspirin 81 MG tablet Take 81 mg by mouth daily.   beta carotene w/minerals tablet Take 1 tablet by mouth daily.   budesonide 3 MG 24 hr capsule Commonly known as: ENTOCORT EC Take by mouth daily.   famotidine 20 MG tablet Commonly known as: PEPCID Take 20 mg by mouth 2 (two) times daily.   fexofenadine 180 MG tablet Commonly known as: ALLEGRA Take 1 tablet (180 mg total) by mouth 2 (two) times daily.   Fluocinolone Acetonide Body 0.01 % Oil APPLY OIL TOPICALLY TO AFFECTED AREA TWICE DAILY AS NEEDED (APPLY SPARINGLY)   FLUoxetine 20 MG capsule Commonly known as: PROZAC Take 20 mg by mouth Daily.   fluticasone 50 MCG/ACT nasal spray Commonly  known as: FLONASE Place 1 spray into both nostrils daily.   Glucosamine HCl 1500 MG Tabs Take 1,500 mg by mouth daily.   glucose blood test strip   levothyroxine 112 MCG tablet Commonly known as: SYNTHROID Take 112 mcg by mouth daily before breakfast.   metFORMIN 500 MG 24 hr tablet Commonly known as: GLUCOPHAGE-XR Take 1 tablet (500 mg total) by mouth 2 (two) times daily with a meal.   multivitamin capsule Take 1 capsule by mouth daily.   OneTouch Delica Plus [id number] Misc USE TO CHECK YOUR BLOOD SUGAR ONCE OR TWICE DAILY   polyvinyl alcohol 1.4 % ophthalmic solution Commonly known as: LIQUIFILM TEARS Place 1 drop into both eyes as needed for dry eyes.   Probiotic Daily Caps Take 1 capsule by mouth every morning.   triamterene-hydrochlorothiazide 37.5-25 MG capsule Commonly known as: DYAZIDE Take 1 capsule by mouth Daily.   VITAMIN D-3 PO Take 2,000 Units by mouth daily.       Allergies:  Allergies  Allergen Reactions  . Statins     Aches in her bones    Past Medical History, Surgical history, Social history, and Family History were reviewed and updated.  Review of Systems: Review  of Systems  Constitutional: Negative.   HENT: Negative.   Eyes: Negative.   Respiratory: Negative.   Cardiovascular: Negative.   Gastrointestinal: Negative.   Genitourinary: Negative.   Musculoskeletal: Negative.   Skin: Negative.   Endo/Heme/Allergies: Negative.   Psychiatric/Behavioral: Negative.     Physical Exam:  weight is 156 lb (70.8 kg). Her oral temperature is 98.6 F (37 C). Her blood pressure is 165/63 (abnormal) and her pulse is 58 (abnormal). Her respiration is 20 and oxygen saturation is 99%.   Wt Readings from Last 3 Encounters:  06/01/20 156 lb (70.8 kg)  02/12/20 154 lb (69.9 kg)  12/01/19 150 lb (68 kg)    Physical Exam Vitals reviewed.  HENT:     Head: Normocephalic and atraumatic.  Eyes:     Pupils: Pupils are equal, round, and reactive to  light.  Cardiovascular:     Rate and Rhythm: Normal rate and regular rhythm.     Heart sounds: Normal heart sounds.  Pulmonary:     Effort: Pulmonary effort is normal.     Breath sounds: Normal breath sounds.  Abdominal:     General: Bowel sounds are normal.     Palpations: Abdomen is soft.  Musculoskeletal:        General: No tenderness or deformity. Normal range of motion.     Cervical back: Normal range of motion.  Lymphadenopathy:     Cervical: No cervical adenopathy.  Skin:    General: Skin is warm and dry.     Findings: No erythema or rash.  Neurological:     Mental Status: She is alert and oriented to person, place, and time.  Psychiatric:        Behavior: Behavior normal.        Thought Content: Thought content normal.        Judgment: Judgment normal.      Lab Results  Component Value Date   WBC 6.3 06/01/2020   HGB 11.6 (L) 06/01/2020   HCT 34.7 (L) 06/01/2020   MCV 90.4 06/01/2020   PLT 206 06/01/2020   Lab Results  Component Value Date   FERRITIN 250 10/14/2009   Lab Results  Component Value Date   RETICCTPCT 1.2 12/17/2008   RBC 3.84 (L) 06/01/2020   RETICCTABS 48.7 12/17/2008   No results found for: KPAFRELGTCHN, LAMBDASER, KAPLAMBRATIO No results found for: IGGSERUM, IGA, IGMSERUM No results found for: Odetta Pink, SPEI   Chemistry      Component Value Date/Time   NA 137 06/01/2020 0940   NA 142 03/22/2018 1059   NA 139 05/21/2017 0838   K 4.1 06/01/2020 0940   K 4.3 05/21/2017 0838   CL 99 06/01/2020 0940   CL 102 05/18/2016 1505   CL 100 11/01/2015 0932   CO2 29 06/01/2020 0940   CO2 28 05/21/2017 0838   BUN 16 06/01/2020 0940   BUN 13 03/22/2018 1059   BUN 20.8 05/21/2017 0838   CREATININE 1.04 (H) 06/01/2020 0940   CREATININE 1.0 05/21/2017 0838      Component Value Date/Time   CALCIUM 9.9 06/01/2020 0940   CALCIUM 10.5 (H) 05/21/2017 0838   ALKPHOS 38 06/01/2020 0940    ALKPHOS 49 05/21/2017 0838   AST 20 06/01/2020 0940   AST 22 05/21/2017 0838   ALT 28 06/01/2020 0940   ALT 33 05/21/2017 0838   BILITOT 0.4 06/01/2020 0940   BILITOT 0.57 05/21/2017 0838      Impression  and Plan: Robin [name] is a very pleasant 79 yo caucasian female with history of locally recurrent adenocarcinoma of the colon with resection in 2000. She received adjuvant FOLFOX.  I see no evidence of recurrent disease.  For right now, we will just follow her up in another 6 months.  She apparently was thought to have had mastocytosis.  I just do not see anything on her blood work are on her physical exam that would suggest this.     [name], MD 9/7/202111:23 AM

## 2020-06-01 NOTE — Telephone Encounter (Signed)
Appointments scheduled calendar printed & mailed per 9/7 los

## 2020-06-02 LAB — TRYPTASE: Tryptase: 18.5 ug/L — ABNORMAL HIGH (ref 2.2–13.2)

## 2020-07-20 DIAGNOSIS — E1169 Type 2 diabetes mellitus with other specified complication: Secondary | ICD-10-CM | POA: Diagnosis not present

## 2020-07-20 DIAGNOSIS — E039 Hypothyroidism, unspecified: Secondary | ICD-10-CM | POA: Diagnosis not present

## 2020-07-20 DIAGNOSIS — E782 Mixed hyperlipidemia: Secondary | ICD-10-CM | POA: Diagnosis not present

## 2020-07-20 DIAGNOSIS — I10 Essential (primary) hypertension: Secondary | ICD-10-CM | POA: Diagnosis not present

## 2020-07-20 DIAGNOSIS — R141 Gas pain: Secondary | ICD-10-CM | POA: Diagnosis not present

## 2020-07-20 DIAGNOSIS — R1013 Epigastric pain: Secondary | ICD-10-CM | POA: Diagnosis not present

## 2020-07-20 DIAGNOSIS — Z7984 Long term (current) use of oral hypoglycemic drugs: Secondary | ICD-10-CM | POA: Diagnosis not present

## 2020-07-20 DIAGNOSIS — R0789 Other chest pain: Secondary | ICD-10-CM | POA: Diagnosis not present

## 2020-07-20 DIAGNOSIS — N183 Chronic kidney disease, stage 3 unspecified: Secondary | ICD-10-CM | POA: Diagnosis not present

## 2020-08-10 DIAGNOSIS — L570 Actinic keratosis: Secondary | ICD-10-CM | POA: Diagnosis not present

## 2020-08-10 DIAGNOSIS — L578 Other skin changes due to chronic exposure to nonionizing radiation: Secondary | ICD-10-CM | POA: Diagnosis not present

## 2020-08-10 DIAGNOSIS — L723 Sebaceous cyst: Secondary | ICD-10-CM | POA: Diagnosis not present

## 2020-08-11 DIAGNOSIS — K52832 Lymphocytic colitis: Secondary | ICD-10-CM | POA: Diagnosis not present

## 2020-08-11 DIAGNOSIS — Z85038 Personal history of other malignant neoplasm of large intestine: Secondary | ICD-10-CM | POA: Diagnosis not present

## 2020-08-11 DIAGNOSIS — R1013 Epigastric pain: Secondary | ICD-10-CM | POA: Diagnosis not present

## 2020-08-11 DIAGNOSIS — K529 Noninfective gastroenteritis and colitis, unspecified: Secondary | ICD-10-CM | POA: Diagnosis not present

## 2020-08-13 ENCOUNTER — Other Ambulatory Visit: Payer: Self-pay

## 2020-08-13 ENCOUNTER — Encounter: Payer: Self-pay | Admitting: Internal Medicine

## 2020-08-13 ENCOUNTER — Ambulatory Visit: Payer: Medicare PPO | Admitting: Internal Medicine

## 2020-08-13 VITALS — BP 150/90 | HR 61 | Ht 61.0 in | Wt 155.8 lb

## 2020-08-13 DIAGNOSIS — E1121 Type 2 diabetes mellitus with diabetic nephropathy: Secondary | ICD-10-CM

## 2020-08-13 DIAGNOSIS — E785 Hyperlipidemia, unspecified: Secondary | ICD-10-CM

## 2020-08-13 DIAGNOSIS — E1169 Type 2 diabetes mellitus with other specified complication: Secondary | ICD-10-CM

## 2020-08-13 DIAGNOSIS — E049 Nontoxic goiter, unspecified: Secondary | ICD-10-CM

## 2020-08-13 NOTE — Patient Instructions (Signed)
Please continue Metformin ER 500 mg 2x a day  Please return in 4-6 months with your sugar log.

## 2020-08-13 NOTE — Progress Notes (Signed)
Patient ID: ROSITA GUZZETTA, female   DOB: 20-Jun-1941, 79 y.o.   MRN: [id number]  This visit occurred during the SARS-CoV-2 public health emergency.  Safety protocols were in place, including screening questions prior to the visit, additional usage of staff PPE, and extensive cleaning of exam room while observing appropriate contact time as indicated for disinfecting solutions.   HPI: ALEXAS BASULTO is a 79 y.o.-year-old female, returning for f/u for DM2, initially GDM, then DM2 dx in 05/2015, non-insulin-dependent, controlled, with complications (mild CKD). Last visit 6 months ago.  Reviewed HbA1c levels: 07/20/2020: HbA1c 6.6% Lab Results  Component Value Date   HGBA1C 6.0 (A) 02/12/2020   HGBA1C 6.2 (A) 08/14/2019   HGBA1C 6.5 (A) 02/11/2019   Pt is on a regimen of: - Metformin ER 1000 mg with dinner -she had diarrhea with higher doses >> 500 mg twice a day  Pt checks her sugars once a day per review of her meter downloads and her report: - am: 144 >> n/c >> 186 x1 >> n/c >> 123 >> 103 - 2h after b'fast: n/c >> 74 - before lunch: 86-98 >> 58,  90-95 >> 74 - 2h after lunch:  79, 86 >> 145 - before dinner: 104-120, 174, 183 (apple) >> 94-122, 159 >> n/c - 2h after dinner:  83 >> 120-130 >> n/c - bedtime: 115, 116 >> 94  >> n/c - nighttime: n/c Lowest sugar was 70s >> 82 >> 91 >> 79 >> 58 >> 74; she has hypoglycemia awareness in the 70s. Highest sugar was 145 >> 218 (steroids) >> 186 >> 159 >> ?.  Glucometer: One Touch  Pt's meals are: - Breakfast: cheerios + raisin bran or cornflakes + 2% milk or bacon + egg + toast - Lunch: salad or soup or sandwich - Dinner: veggies + starch - Snacks: no desserts; apple, chips She saw a dietitian 07/09/2015-this helped a lot. In 2019 had a pruritic maculopapular generalized rash. She saw PCP and dermatology and no etiology was found. She then saw Dr. Marin Olp - suspicion for mastocytosis  - BM Bx negative.  She found out she is allergic to meat.   She stopped eating meat and the rash resolved.  Of note, alpha gal mutation was negative.  She was also investigated for gallstones.  + Mild CKD, last BUN/creatinine:  07/20/2020 18/1.06, GFR 50 Lab Results  Component Value Date   BUN 16 06/01/2020   CREATININE 1.04 (H) 06/01/2020  10/22/2018: ACR 11.8  + HL; last set of lipids: 07/20/2020: 261/310/49/155 10/22/2018: 228/352/39/119 Recent lipids: TG 600 >> started Fenofibrate >> TG 200s 10/10/2016: 246/381/40/130   Lab Results  Component Value Date   CHOL 210 (H) 02/12/2020   HDL 44.60 02/12/2020   LDLCALC 131 (H) 11/01/2015   LDLDIRECT 134.0 02/12/2020   TRIG 253.0 (H) 02/12/2020   CHOLHDL 5 02/12/2020  She tried statins >> Bone pain.  She tried Benecol spread but did not like it.  She also developed a rash from fenofibrate.  I suggested fish oil at last visit and suggested a referral to nutrition.  She did not have this appointment yet. She forgot about the fish oil. PCP started Zetia 10 mg daily.  - last eye exam was on Summer 2021: reportedly No DR  -No numbness and tingling in her feet.  She also has HTN, OSA, depression, osteoarthritis, spinal stenosis, osteopenia, GERD, CKD stage III. + h/o colon cancer.  Hypothyroidism:  Pt is on levothyroxine 112 mcg daily, taken: -  in am - fasting - at least 30 min from b'fast - no calcium - no iron - no multivitamins - no PPIs - not on Biotin She takes Pepcid in the morning, separated by 1 to 2 hours from levothyroxine.  Reviewed latest TSH levels: 07/20/2020: 2.22 Lab Results  Component Value Date   TSH 0.84 02/12/2020   TSH 2.140 03/22/2018  10/22/2018: TSH 0.76  In 2019 she was found to have elevated ATA antibodies: Lab Results  Component Value Date   THGAB 93.8 (H) 03/22/2018   We checked a thyroid ultrasound since she has a prominent left thyroid lobe on lobulation Thyroid U/S (03/03/2019): no nodules, heterogeneity and atrophy  ROS: Constitutional: no weight  gain/no weight loss, no fatigue, no subjective hyperthermia, no subjective hypothermia Eyes: no blurry vision, no xerophthalmia ENT: no sore throat, no nodules palpated in neck, no dysphagia, no odynophagia, no hoarseness Cardiovascular: no CP/no SOB/no palpitations/no leg swelling Respiratory: no cough/no SOB/no wheezing Gastrointestinal: no N/no V/no D/no C/no acid reflux Musculoskeletal: no muscle aches/no joint aches Skin: no rashes, no hair loss Neurological: no tremors/no numbness/no tingling/no dizziness  I reviewed pt's medications, allergies, PMH, social hx, family hx, and changes were documented in the history of present illness. Otherwise, unchanged from my initial visit note.   Past Medical History:  Diagnosis Date  . Arthritis   . Colon cancer ([place])    colon ca dx 2008 and 2010  . Complication of anesthesia    problems waking up-patient thinks she was given too much medication  . Gestational diabetes   . Headache    headaches all the time  . Hypertension   . Sleep apnea    uses CPAP  . Urticaria    Past Surgical History:  Procedure Laterality Date  . ABDOMINAL HYSTERECTOMY    . APPENDECTOMY    . BACK SURGERY  2009  . CARPAL TUNNEL RELEASE    . COLON SURGERY    . COLONOSCOPY WITH PROPOFOL N/A 08/10/2015   Procedure: COLONOSCOPY WITH PROPOFOL;  Surgeon: Garlan Fair, MD;  Location: [facility];  Service: Endoscopy;  Laterality: N/A;  . DILATION AND CURETTAGE OF UTERUS     had before had hysterectomy  . ESOPHAGOGASTRODUODENOSCOPY (EGD) WITH PROPOFOL N/A 08/10/2015   Procedure: ESOPHAGOGASTRODUODENOSCOPY (EGD) WITH PROPOFOL;  Surgeon: Garlan Fair, MD;  Location: [facility];  Service: Endoscopy;  Laterality: N/A;  . EYE SURGERY     bilateral cataract surgery   . ROTATOR CUFF REPAIR     Social History   Socioeconomic History  . Marital status: Divorced    Spouse name: Not on file  . Number of children: 4  . Years of education: 12+  . Highest  education level: Not on file  Occupational History  . 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 Smoking status: Never Smoker  . Smokeless tobacco: Never Used  . Tobacco comment: never used tobacco  Vaping Use  . Vaping Use: Never used  Substance and Sexual Activity  . Alcohol use: No    Alcohol/week: 0.0 standard drinks  . Drug use: No  . Sexual activity: Not on file  Other Topics Concern  . Not on file  Social History Narrative   Lives at home with herself.   Caffeine use: Drinks coffee and tea (1 cup coffee per day). Tea rarely    Social Determinants of Health   Financial Resource Strain:   . Difficulty of Paying Living Expenses: Not on file  Food Insecurity:   .  Worried About Charity fundraiser in the Last Year: Not on file  . Ran Out of Food in the Last Year: Not on file  Transportation Needs:   . Lack of Transportation (Medical): Not on file  . Lack of Transportation (Non-Medical): Not on file  Physical Activity:   . Days of Exercise per Week: Not on file  . Minutes of Exercise per Session: Not on file  Stress:   . Feeling of Stress : Not on file  Social Connections:   . Frequency of Communication with Friends and Family: Not on file  . Frequency of Social Gatherings with Friends and Family: Not on file  . Attends Religious Services: Not on file  . Active Member of Clubs or Organizations: Not on file  . Attends Archivist Meetings: Not on file  . Marital Status: Not on file  Intimate Partner Violence:   . Fear of Current or Ex-Partner: Not on file  . Emotionally Abused: Not on file  . Physically Abused: Not on file  . Sexually Abused: Not on file   Current Outpatient Medications on File Prior to Visit  Medication Sig Dispense Refill  . amLODipine (NORVASC) 5 MG tablet Take 5 mg by mouth Daily.    Marland Kitchen aspirin 81 MG tablet Take 81 mg by mouth daily.    . beta carotene w/minerals (OCUVITE) tablet Take 1 tablet by mouth daily.    . budesonide (ENTOCORT  EC) 3 MG 24 hr capsule Take by mouth daily.     . Cholecalciferol (VITAMIN D-3 PO) Take 2,000 Units by mouth daily.    . famotidine (PEPCID) 20 MG tablet Take 20 mg by mouth 2 (two) times daily.    . fexofenadine (ALLEGRA) 180 MG tablet Take 1 tablet (180 mg total) by mouth 2 (two) times daily. 60 tablet 3  . Fluocinolone Acetonide Body 0.01 % OIL APPLY OIL TOPICALLY TO AFFECTED AREA TWICE DAILY AS NEEDED (APPLY SPARINGLY)    . FLUoxetine (PROZAC) 20 MG capsule Take 20 mg by mouth Daily.    . fluticasone (FLONASE) 50 MCG/ACT nasal spray Place 1 spray into both nostrils daily.    . Glucosamine HCl 1500 MG TABS Take 1,500 mg by mouth daily.     Marland Kitchen glucose blood test strip     . Lancets (ONETOUCH DELICA PLUS [id number]) MISC USE TO CHECK YOUR BLOOD SUGAR ONCE OR TWICE DAILY    . levothyroxine (SYNTHROID, LEVOTHROID) 112 MCG tablet Take 112 mcg by mouth daily before breakfast.     . metFORMIN (GLUCOPHAGE-XR) 500 MG 24 hr tablet Take 1 tablet (500 mg total) by mouth 2 (two) times daily with a meal. 180 tablet 3  . Multiple Vitamin (MULTIVITAMIN) capsule Take 1 capsule by mouth daily.    . polyvinyl alcohol (LIQUIFILM TEARS) 1.4 % ophthalmic solution Place 1 drop into both eyes as needed for dry eyes.    . Probiotic Product (PROBIOTIC DAILY) CAPS Take 1 capsule by mouth every morning.     . triamterene-hydrochlorothiazide (DYAZIDE) 37.5-25 MG per capsule Take 1 capsule by mouth Daily.      No current facility-administered medications on file prior to visit.   Allergies  Allergen Reactions  . Statins     Aches in her bones   Family History  Problem Relation Age of Onset  . Migraines Neg Hx    PE: BP (!) 150/90   Pulse 61   Ht 5\' 1"  (1.549 m)   Wt 155 lb 12.8 oz (  70.7 kg)   SpO2 96%   BMI 29.44 kg/m  Body mass index is 29.44 kg/m. Wt Readings from Last 3 Encounters:  08/13/20 155 lb 12.8 oz (70.7 kg)  06/01/20 156 lb (70.8 kg)  02/12/20 154 lb (69.9 kg)   Constitutional: Slight  overweight, in NAD Eyes: PERRLA, EOMI, no exophthalmos ENT: moist mucous membranes, + slight left thyroid fullness, no cervical lymphadenopathy Cardiovascular: RRR, No MRG Respiratory: CTA B Gastrointestinal: abdomen soft, NT, ND, BS+ Musculoskeletal: no deformities, strength intact in all 4 Skin: moist, warm, no rashes Neurological: no tremor with outstretched hands, DTR normal in all 4  ASSESSMENT: 1. DM2, non-insulin-dependent, controlled, with long-term complications -Mild CKD  2. HL  3.  Enlarged thyroid - L  PLAN:  1. Patient with history of controlled diabetes, on oral antidiabetic regimen with only half maximal Metformin dose.  We could not increase the dose due to diarrhea. -At last visit, sugars were almost all at goal.  She did have a low blood sugar of 58 but she did not remember what happened before this.  She also had a slightly high blood sugar in the 150s.  Otherwise, since the rest of her sugars were at goal, we continued the same regimen.  HbA1c at that time was 6.0%. -I received the latest HbA1c result from PCP: 07/20/2020: 6.6%, higher, but still at goal -At today's visit, we downloaded her meter for the last 2 weeks and she only had 2 blood sugar values in there.  They were both at goal.  However, I advised her that this is not the norm, and she will need to check approximately every day, rotating check times.  However, based on his sugars, I would not make any changes in regimen for now. - I suggested to:  Patient Instructions  Please continue Metformin ER 500 mg 2x a day  Please return in 4-6 months with your sugar log.   - advised to check sugars at different times of the day - 1x a day, rotating check times - advised for yearly eye exams >> she is UTD - return to clinic in 4-6 months  2. HL -Reviewed latest lipid panel from 07/20/2020: 261/310/49/155 -very high LDL and also high triglycerides -She is intolerant to statins.  Also, she developed a rash with  fenofibrate.  I suggested fish oil at last visit. Now on Zetia 10 mg daily  - just started by PCP. - has a repeat Lipid panel in 09/2019 with PCP   3. Enlarged left thyroid -In the past, her left thyroid lobe appeared more prominent on palpation so we checked a thyroid ultrasound.  This was negative for nodules.  She does have a diagnosis of Hashimoto's thyroiditis based on elevated ATA antibodies. - latest thyroid labs reviewed with pt >> normal: Lab Results  Component Value Date   TSH 0.84 02/12/2020   - she continues on LT4 112 mcg daily - pt feels good on this dose. - we discussed about taking the thyroid hormone every day, with water, >30 minutes before breakfast, separated by >4 hours from acid reflux medications, calcium, iron, multivitamins. Pt. is taking it correctly.  Philemon Kingdom, MD PhD [facility] Endocrinology

## 2020-08-18 ENCOUNTER — Other Ambulatory Visit: Payer: Self-pay | Admitting: Family Medicine

## 2020-10-13 ENCOUNTER — Other Ambulatory Visit: Payer: Self-pay | Admitting: Family Medicine

## 2020-10-13 DIAGNOSIS — Z1231 Encounter for screening mammogram for malignant neoplasm of breast: Secondary | ICD-10-CM

## 2020-11-08 DIAGNOSIS — E782 Mixed hyperlipidemia: Secondary | ICD-10-CM | POA: Diagnosis not present

## 2020-11-10 DIAGNOSIS — G4733 Obstructive sleep apnea (adult) (pediatric): Secondary | ICD-10-CM | POA: Diagnosis not present

## 2020-11-15 DIAGNOSIS — E039 Hypothyroidism, unspecified: Secondary | ICD-10-CM | POA: Diagnosis not present

## 2020-11-15 DIAGNOSIS — Z0000 Encounter for general adult medical examination without abnormal findings: Secondary | ICD-10-CM | POA: Diagnosis not present

## 2020-11-15 DIAGNOSIS — Z6829 Body mass index (BMI) 29.0-29.9, adult: Secondary | ICD-10-CM | POA: Diagnosis not present

## 2020-11-15 DIAGNOSIS — K52832 Lymphocytic colitis: Secondary | ICD-10-CM | POA: Diagnosis not present

## 2020-11-15 DIAGNOSIS — Z23 Encounter for immunization: Secondary | ICD-10-CM | POA: Diagnosis not present

## 2020-11-15 DIAGNOSIS — I10 Essential (primary) hypertension: Secondary | ICD-10-CM | POA: Diagnosis not present

## 2020-11-15 DIAGNOSIS — E1122 Type 2 diabetes mellitus with diabetic chronic kidney disease: Secondary | ICD-10-CM | POA: Diagnosis not present

## 2020-11-15 DIAGNOSIS — F339 Major depressive disorder, recurrent, unspecified: Secondary | ICD-10-CM | POA: Diagnosis not present

## 2020-11-15 DIAGNOSIS — G720 Drug-induced myopathy: Secondary | ICD-10-CM | POA: Diagnosis not present

## 2020-11-18 DIAGNOSIS — K52832 Lymphocytic colitis: Secondary | ICD-10-CM | POA: Diagnosis not present

## 2020-11-18 DIAGNOSIS — K529 Noninfective gastroenteritis and colitis, unspecified: Secondary | ICD-10-CM | POA: Diagnosis not present

## 2020-11-18 DIAGNOSIS — Z85038 Personal history of other malignant neoplasm of large intestine: Secondary | ICD-10-CM | POA: Diagnosis not present

## 2020-11-24 ENCOUNTER — Ambulatory Visit
Admission: RE | Admit: 2020-11-24 | Discharge: 2020-11-24 | Disposition: A | Discharge status: Home / Self Care | Payer: Medicare PPO | Source: Ambulatory Visit | Attending: Family Medicine | Admitting: Family Medicine

## 2020-11-24 ENCOUNTER — Other Ambulatory Visit: Payer: Self-pay

## 2020-11-24 DIAGNOSIS — Z1231 Encounter for screening mammogram for malignant neoplasm of breast: Secondary | ICD-10-CM | POA: Diagnosis not present

## 2020-11-25 ENCOUNTER — Ambulatory Visit: Payer: Medicare PPO

## 2020-11-29 ENCOUNTER — Encounter: Payer: Self-pay | Admitting: Hematology & Oncology

## 2020-11-29 ENCOUNTER — Inpatient Hospital Stay: Payer: Medicare PPO | Admitting: Hematology & Oncology

## 2020-11-29 ENCOUNTER — Other Ambulatory Visit: Payer: Self-pay

## 2020-11-29 ENCOUNTER — Inpatient Hospital Stay: Payer: Medicare PPO | Attending: Hematology & Oncology

## 2020-11-29 VITALS — BP 145/71 | HR 65 | Temp 98.4°F | Resp 20 | Wt 158.0 lb

## 2020-11-29 DIAGNOSIS — C184 Malignant neoplasm of transverse colon: Secondary | ICD-10-CM

## 2020-11-29 DIAGNOSIS — Z85038 Personal history of other malignant neoplasm of large intestine: Secondary | ICD-10-CM | POA: Insufficient documentation

## 2020-11-29 LAB — CMP (CANCER CENTER ONLY)
ALT: 28 U/L (ref 0–44)
AST: 24 U/L (ref 15–41)
Albumin: 4.5 g/dL (ref 3.5–5.0)
Alkaline Phosphatase: 46 U/L (ref 38–126)
Anion gap: 9 (ref 5–15)
BUN: 13 mg/dL (ref 8–23)
CO2: 29 mmol/L (ref 22–32)
Calcium: 10.2 mg/dL (ref 8.9–10.3)
Chloride: 97 mmol/L — ABNORMAL LOW (ref 98–111)
Creatinine: 1 mg/dL (ref 0.44–1.00)
GFR, Estimated: 57 mL/min — ABNORMAL LOW (ref >60)
Glucose, Bld: 145 mg/dL — ABNORMAL HIGH (ref 70–99)
Potassium: 4.1 mmol/L (ref 3.5–5.1)
Sodium: 135 mmol/L (ref 135–145)
Total Bilirubin: 0.6 mg/dL (ref 0.3–1.2)
Total Protein: 6.6 g/dL (ref 6.5–8.1)

## 2020-11-29 LAB — CBC WITH DIFFERENTIAL (CANCER CENTER ONLY)
Abs Immature Granulocytes: 0.01 10*3/uL (ref 0.00–0.07)
Basophils Absolute: 0 10*3/uL (ref 0.0–0.1)
Basophils Relative: 1 %
Eosinophils Absolute: 0.3 10*3/uL (ref 0.0–0.5)
Eosinophils Relative: 6 %
HCT: 34.7 % — ABNORMAL LOW (ref 36.0–46.0)
Hemoglobin: 11.8 g/dL — ABNORMAL LOW (ref 12.0–15.0)
Immature Granulocytes: 0 %
Lymphocytes Relative: 37 %
Lymphs Abs: 2 10*3/uL (ref 0.7–4.0)
MCH: 30.5 pg (ref 26.0–34.0)
MCHC: 34 g/dL (ref 30.0–36.0)
MCV: 89.7 fL (ref 80.0–100.0)
Monocytes Absolute: 0.5 10*3/uL (ref 0.1–1.0)
Monocytes Relative: 9 %
Neutro Abs: 2.5 10*3/uL (ref 1.7–7.7)
Neutrophils Relative %: 47 %
Platelet Count: 187 10*3/uL (ref 150–400)
RBC: 3.87 MIL/uL (ref 3.87–5.11)
RDW: 12.4 % (ref 11.5–15.5)
WBC Count: 5.3 10*3/uL (ref 4.0–10.5)
nRBC: 0 % (ref 0.0–0.2)

## 2020-11-29 LAB — LACTATE DEHYDROGENASE: LDH: 133 U/L (ref 98–192)

## 2020-11-29 NOTE — Progress Notes (Signed)
Hematology and Oncology Follow Up Visit  Robin [name] [id number] 06-12-1941 80 y.o. 11/29/2020   Principle Diagnosis:  History of locally recurrent adenocarcinoma of the colon  Current Therapy:   Observation    Interim History:  Robin [name] is here today for follow-up. He is here every 6 months. So far, she been doing quite well. She has had no problems since we last saw her. She is expecting a new great grandson in May.  She has had no problems with abdominal pain. She has had no issues with nausea or vomiting. She has had no problems with fever. There is been no issues with the coronavirus.  She has had no bleeding. There is been no change in bowel or bladder habits.  Overall, I would say her performance status is ECOG 1.   Medications:  Allergies as of 11/29/2020      Reactions   Statins    Aches in her bones      Medication List       Accurate as of November 29, 2020 10:30 AM. If you have any questions, ask your nurse or doctor.        amLODipine 5 MG tablet Commonly known as: NORVASC Take 5 mg by mouth Daily.   aspirin 81 MG tablet Take 81 mg by mouth daily.   beta carotene w/minerals tablet Take 1 tablet by mouth daily.   budesonide 3 MG 24 hr capsule Commonly known as: ENTOCORT EC Take by mouth daily as needed.   EPINEPHrine 0.3 mg/0.3 mL Soaj injection Commonly known as: EPI-PEN See admin instructions.   ezetimibe 10 MG tablet Commonly known as: ZETIA daily.   famotidine 20 MG tablet Commonly known as: PEPCID Take 20 mg by mouth 2 (two) times daily as needed.   fexofenadine 180 MG tablet Commonly known as: ALLEGRA Take 1 tablet (180 mg total) by mouth 2 (two) times daily.   Fluocinolone Acetonide Body 0.01 % Oil APPLY OIL TOPICALLY TO AFFECTED AREA TWICE DAILY AS NEEDED (APPLY SPARINGLY)   FLUoxetine 20 MG capsule Commonly known as: PROZAC Take 20 mg by mouth Daily.   fluticasone 50 MCG/ACT nasal spray Commonly known as: FLONASE Place 1  spray into both nostrils daily.   Glucosamine HCl 1500 MG Tabs Take 1,500 mg by mouth daily.   glucose blood test strip   levothyroxine 112 MCG tablet Commonly known as: SYNTHROID Take 112 mcg by mouth daily before breakfast.   Magnesium 400 MG Tabs daily.   metFORMIN 500 MG 24 hr tablet Commonly known as: GLUCOPHAGE-XR Take 1 tablet (500 mg total) by mouth 2 (two) times daily with a meal.   multivitamin capsule Take 1 capsule by mouth daily.   OneTouch Delica Plus [id number] Misc USE TO CHECK YOUR BLOOD SUGAR ONCE OR TWICE DAILY   polyvinyl alcohol 1.4 % ophthalmic solution Commonly known as: LIQUIFILM TEARS Place 1 drop into both eyes as needed for dry eyes.   Probiotic Daily Caps Take 1 capsule by mouth every morning.   triamterene-hydrochlorothiazide 37.5-25 MG capsule Commonly known as: DYAZIDE Take 1 capsule by mouth Daily.   vitamin B-12 100 MCG tablet Commonly known as: CYANOCOBALAMIN daily.   Vitamin C 500 MG Caps daily.   VITAMIN D-3 PO Take 2,000 Units by mouth daily.   Zinc 30 MG Caps daily.       Allergies:  Allergies  Allergen Reactions  . Statins     Aches in her bones    Past Medical History, Surgical  history, Social history, and Family History were reviewed and updated.  Review of Systems: Review of Systems  Constitutional: Negative.   HENT: Negative.   Eyes: Negative.   Respiratory: Negative.   Cardiovascular: Negative.   Gastrointestinal: Negative.   Genitourinary: Negative.   Musculoskeletal: Negative.   Skin: Negative.   Endo/Heme/Allergies: Negative.   Psychiatric/Behavioral: Negative.     Physical Exam:  weight is 158 lb (71.7 kg). Her oral temperature is 98.4 F (36.9 C). Her blood pressure is 145/71 (abnormal) and her pulse is 65. Her respiration is 20 and oxygen saturation is 97%.   Wt Readings from Last 3 Encounters:  11/29/20 158 lb (71.7 kg)  08/13/20 155 lb 12.8 oz (70.7 kg)  06/01/20 156 lb (70.8 kg)     Physical Exam Vitals reviewed.  HENT:     Head: Normocephalic and atraumatic.  Eyes:     Pupils: Pupils are equal, round, and reactive to light.  Cardiovascular:     Rate and Rhythm: Normal rate and regular rhythm.     Heart sounds: Normal heart sounds.  Pulmonary:     Effort: Pulmonary effort is normal.     Breath sounds: Normal breath sounds.  Abdominal:     General: Bowel sounds are normal.     Palpations: Abdomen is soft.  Musculoskeletal:        General: No tenderness or deformity. Normal range of motion.     Cervical back: Normal range of motion.  Lymphadenopathy:     Cervical: No cervical adenopathy.  Skin:    General: Skin is warm and dry.     Findings: No erythema or rash.  Neurological:     Mental Status: She is alert and oriented to person, place, and time.  Psychiatric:        Behavior: Behavior normal.        Thought Content: Thought content normal.        Judgment: Judgment normal.      Lab Results  Component Value Date   WBC 5.3 11/29/2020   HGB 11.8 (L) 11/29/2020   HCT 34.7 (L) 11/29/2020   MCV 89.7 11/29/2020   PLT 187 11/29/2020   Lab Results  Component Value Date   FERRITIN 250 10/14/2009   Lab Results  Component Value Date   RETICCTPCT 1.2 12/17/2008   RBC 3.87 11/29/2020   RETICCTABS 48.7 12/17/2008   No results found for: KPAFRELGTCHN, LAMBDASER, KAPLAMBRATIO No results found for: Kandis Cocking, IGMSERUM No results found for: Odetta Pink, SPEI   Chemistry      Component Value Date/Time   NA 135 11/29/2020 0948   NA 142 03/22/2018 1059   NA 139 05/21/2017 0838   K 4.1 11/29/2020 0948   K 4.3 05/21/2017 0838   CL 97 (L) 11/29/2020 0948   CL 102 05/18/2016 1505   CL 100 11/01/2015 0932   CO2 29 11/29/2020 0948   CO2 28 05/21/2017 0838   BUN 13 11/29/2020 0948   BUN 13 03/22/2018 1059   BUN 20.8 05/21/2017 0838   CREATININE 1.00 11/29/2020 0948   CREATININE 1.0  05/21/2017 0838      Component Value Date/Time   CALCIUM 10.2 11/29/2020 0948   CALCIUM 10.5 (H) 05/21/2017 0838   ALKPHOS 46 11/29/2020 0948   ALKPHOS 49 05/21/2017 0838   AST 24 11/29/2020 0948   AST 22 05/21/2017 0838   ALT 28 11/29/2020 0948   ALT 33 05/21/2017 0838   BILITOT  0.6 11/29/2020 0948   BILITOT 0.57 05/21/2017 8979      Impression and Plan: Robin [name] is a very pleasant 80 yo caucasian female with history of locally recurrent adenocarcinoma of the colon with resection in 2000. She received adjuvant FOLFOX.  I see no evidence of recurrent disease.  For right now, we will just follow her up in another 6 months.  I do not see any need for any type of scans right now. I am just glad that her quality life is doing well. Sound like she will have a good year this year.  [name], MD 3/7/202210:30 AM

## 2020-11-30 ENCOUNTER — Telehealth: Payer: Self-pay | Admitting: Hematology & Oncology

## 2020-11-30 NOTE — Telephone Encounter (Signed)
Appointments scheduled per 3/7 los letter/calendar mailed  

## 2020-12-01 LAB — TRYPTASE: Tryptase: 23 ug/L — ABNORMAL HIGH (ref 2.2–13.2)

## 2020-12-02 LAB — IGE: IgE (Immunoglobulin E), Serum: <2 IU/mL — ABNORMAL LOW (ref 6–495)

## 2020-12-14 ENCOUNTER — Telehealth: Payer: Self-pay | Admitting: Internal Medicine

## 2020-12-14 DIAGNOSIS — E1121 Type 2 diabetes mellitus with diabetic nephropathy: Secondary | ICD-10-CM

## 2020-12-14 NOTE — Telephone Encounter (Signed)
MEDICATION: One Touch Verio Test Strips and Lancets  PHARMACY:  [facility] on [address]  HAS THE PATIENT CONTACTED THEIR PHARMACY?  yes  IS THIS A 90 DAY SUPPLY : yes  IS PATIENT OUT OF MEDICATION: yes - 1 week  IF NOT; HOW MUCH IS LEFT:   LAST APPOINTMENT DATE: @11 /19/2021  NEXT APPOINTMENT DATE:@5 /24/2022  DO WE HAVE YOUR PERMISSION TO LEAVE A DETAILED MESSAGE?: [phone] - yes   OTHER COMMENTS:     **Let patient know to contact pharmacy at the end of the day to make sure medication is ready. **  ** Please notify patient to allow 48-72 hours to process**  **Encourage patient to contact the pharmacy for refills or they can request refills through [facility]**

## 2020-12-18 MED ORDER — ONETOUCH DELICA PLUS LANCET33G MISC: 1 refills | Status: DC

## 2020-12-18 MED ORDER — GLUCOSE BLOOD VI STRP: ORAL_STRIP | 1 refills | Status: DC

## 2020-12-18 NOTE — Telephone Encounter (Signed)
Rx sent to preferred pharmacy.

## 2020-12-19 ENCOUNTER — Other Ambulatory Visit: Payer: Self-pay | Admitting: Internal Medicine

## 2020-12-19 DIAGNOSIS — E1121 Type 2 diabetes mellitus with diabetic nephropathy: Secondary | ICD-10-CM

## 2020-12-20 ENCOUNTER — Telehealth: Payer: Self-pay | Admitting: Internal Medicine

## 2020-12-20 DIAGNOSIS — E1121 Type 2 diabetes mellitus with diabetic nephropathy: Secondary | ICD-10-CM

## 2020-12-20 NOTE — Telephone Encounter (Signed)
Patient called to advise that the test supplies called in for her are not covered and she has spoken to her insurance company and now needs to speak with her providers nurse.  Declined to provide information & wants to only speak with nurse.  Call back # [phone]

## 2020-12-20 NOTE — Telephone Encounter (Signed)
Spoke with patient.  [employer] called her saying that [employer] dose not cover One touch verio test strips.  They will cover Accu-Check guide me meters and test strips.  She could not come to pick up a meter her, that was offered to her.  She wanted this to be called in to the [facility] mail order pharmacy.  Telephone number: [phone].

## 2020-12-21 MED ORDER — ACCU-CHEK GUIDE ME W/DEVICE KIT: PACK | 0 refills | Status: DC

## 2020-12-21 MED ORDER — ACCU-CHEK GUIDE VI STRP: ORAL_STRIP | 0 refills | Status: DC

## 2020-12-21 MED ORDER — ACCU-CHEK SOFTCLIX LANCETS MISC: 0 refills | Status: DC

## 2020-12-21 NOTE — Telephone Encounter (Signed)
Rx for meter test strips and lancets sent to preferred pharmacy. Pt advised via Mychart message

## 2021-01-18 DIAGNOSIS — L739 Follicular disorder, unspecified: Secondary | ICD-10-CM | POA: Diagnosis not present

## 2021-01-18 DIAGNOSIS — D225 Melanocytic nevi of trunk: Secondary | ICD-10-CM | POA: Diagnosis not present

## 2021-01-18 DIAGNOSIS — Z86018 Personal history of other benign neoplasm: Secondary | ICD-10-CM | POA: Diagnosis not present

## 2021-01-18 DIAGNOSIS — L723 Sebaceous cyst: Secondary | ICD-10-CM | POA: Diagnosis not present

## 2021-01-18 DIAGNOSIS — L821 Other seborrheic keratosis: Secondary | ICD-10-CM | POA: Diagnosis not present

## 2021-01-18 DIAGNOSIS — L578 Other skin changes due to chronic exposure to nonionizing radiation: Secondary | ICD-10-CM | POA: Diagnosis not present

## 2021-01-18 DIAGNOSIS — L570 Actinic keratosis: Secondary | ICD-10-CM | POA: Diagnosis not present

## 2021-01-18 DIAGNOSIS — Z85828 Personal history of other malignant neoplasm of skin: Secondary | ICD-10-CM | POA: Diagnosis not present

## 2021-01-18 DIAGNOSIS — D239 Other benign neoplasm of skin, unspecified: Secondary | ICD-10-CM | POA: Diagnosis not present

## 2021-01-20 ENCOUNTER — Other Ambulatory Visit: Payer: Self-pay | Admitting: Internal Medicine

## 2021-01-20 DIAGNOSIS — E1121 Type 2 diabetes mellitus with diabetic nephropathy: Secondary | ICD-10-CM

## 2021-01-25 ENCOUNTER — Telehealth: Payer: Self-pay | Admitting: Internal Medicine

## 2021-01-25 DIAGNOSIS — E1121 Type 2 diabetes mellitus with diabetic nephropathy: Secondary | ICD-10-CM

## 2021-01-25 NOTE — Telephone Encounter (Signed)
Patient called stating her sugars have been high recently (about the last 25 days) - patient wonders if needs a change in medication or something to help sugars go back down to normal. They've been in the 100's range. Patient asked to be called back at [phone]

## 2021-01-25 NOTE — Telephone Encounter (Signed)
T, are these sugars checked before meals, after meals?  Any other changes in her medications or diet?

## 2021-01-25 NOTE — Telephone Encounter (Signed)
Called and spoke with pt who advised her sugars have been 150, 143, 169, 139, and even as high as 172 over the last 20 plus days. She has not changed her regimen and is unsure why her sugars have increased so much. Please advise.

## 2021-01-26 MED ORDER — SITAGLIPTIN PHOSPHATE 100 MG PO TABS: 100.0000 mg | ORAL_TABLET | Freq: Every day | ORAL | 1 refills | Status: DC

## 2021-01-26 NOTE — Telephone Encounter (Signed)
Called and advised pt rx for Januvia sent to preferred pharmacy.

## 2021-01-26 NOTE — Telephone Encounter (Signed)
Pt has not had any changes to diet or time of testing.

## 2021-01-26 NOTE — Telephone Encounter (Signed)
Called and left a message for pt to call back to answer Dr's questions regarding any changes in her diet and what time of the day she is checking her sugars? (after a meal, before a meal, first thing in the morning, or at the end of the day)

## 2021-01-26 NOTE — Telephone Encounter (Signed)
When Patient returned call a Teams message was sent to [place] re:  "[11:36 AM] [place], Orpha Bur But if you can ask her if there are any changes to her diet or time of testing  [11:36 AM] Shan Levans No to the above"

## 2021-01-26 NOTE — Telephone Encounter (Signed)
Patient returned call and requests to be called at ph# [phone]. Patient states she will be available for the rest of the day.

## 2021-02-15 ENCOUNTER — Other Ambulatory Visit: Payer: Self-pay

## 2021-02-15 ENCOUNTER — Ambulatory Visit: Payer: Medicare PPO | Admitting: Internal Medicine

## 2021-02-15 ENCOUNTER — Encounter: Payer: Self-pay | Admitting: Internal Medicine

## 2021-02-15 VITALS — BP 140/70 | HR 77 | Ht 61.0 in | Wt 157.6 lb

## 2021-02-15 DIAGNOSIS — E063 Autoimmune thyroiditis: Secondary | ICD-10-CM | POA: Diagnosis not present

## 2021-02-15 DIAGNOSIS — E1121 Type 2 diabetes mellitus with diabetic nephropathy: Secondary | ICD-10-CM | POA: Diagnosis not present

## 2021-02-15 DIAGNOSIS — E038 Other specified hypothyroidism: Secondary | ICD-10-CM | POA: Diagnosis not present

## 2021-02-15 DIAGNOSIS — E1169 Type 2 diabetes mellitus with other specified complication: Secondary | ICD-10-CM | POA: Diagnosis not present

## 2021-02-15 DIAGNOSIS — E785 Hyperlipidemia, unspecified: Secondary | ICD-10-CM | POA: Diagnosis not present

## 2021-02-15 LAB — POCT GLYCOSYLATED HEMOGLOBIN (HGB A1C): Hemoglobin A1C: 7.1 % — AB (ref 4.0–5.6)

## 2021-02-15 MED ORDER — METFORMIN HCL ER 500 MG PO TB24: ORAL_TABLET | ORAL | 3 refills | Status: DC

## 2021-02-15 NOTE — Addendum Note (Signed)
Addended by: Lauralyn Primes on: 02/15/2021 04:46 PM   Modules accepted: Orders

## 2021-02-15 NOTE — Progress Notes (Signed)
Patient ID: Robin [name], female   DOB: 1940-10-11, 80 y.o.   MRN: [id number]  This visit occurred during the SARS-CoV-2 public health emergency.  Safety protocols were in place, including screening questions prior to the visit, additional usage of staff PPE, and extensive cleaning of exam room while observing appropriate contact time as indicated for disinfecting solutions.   HPI: Robin [name] is a 80 y.o.-year-old female, returning for f/u for DM2, initially GDM, then DM2 dx in 05/2015, non-insulin-dependent, controlled, with complications (mild CKD). Last visit 6 months ago.  Interim history: No increased urination, blurry vision, nausea, CP. She noticed that her sugars were slightly higher lately despite no significant changes in her diet and also starting to walk more frequently with her neighbor. She will go to the beach with her family at the end of June [facility]).  Reviewed HbA1c levels: 07/20/2020: HbA1c 6.6% Lab Results  Component Value Date   HGBA1C 6.0 (A) 02/12/2020   HGBA1C 6.2 (A) 08/14/2019   HGBA1C 6.5 (A) 02/11/2019   Pt is on a regimen of: - Metformin ER 1000 mg with dinner -she had diarrhea with higher doses >> 500 mg twice a day >> 1000 mg in am and 500 mg with dinner She did not start Januvia - $.  Pt checks her sugars once a day per review of her meter downloads and her report: - am: 186 x1 >> n/c >> 123 >> 103 >> 141, 168 - 2h after b'fast: n/c >> 74 >> 126 - before lunch: 86-98 >> 58,  90-95 >> 74 >> 98 - 2h after lunch:  79, 86 >> 145 - before dinner: 94-122, 159 >> n/c >> 62, 87, 102-131, 150, 168, 172 - 2h after dinner:  83 >> 120-130 >> n/c >> 110 - bedtime: 115, 116 >> 94  >> n/c >> 139 - nighttime: n/c Lowest sugar was 58 >> 74 >> 62; she has hypoglycemia awareness in the 70s. Highest sugar was 1218 (steroids) >> 186 >> 159 >> 172.  Glucometer: One Touch  Pt's meals are: - Breakfast: cheerios + raisin bran or cornflakes + 2% milk or bacon +  egg + toast - Lunch: salad or soup or sandwich - Dinner: veggies + starch - Snacks: no desserts; apple, chips She saw a dietitian 07/09/2015-this helped a lot. In 2019 had a pruritic maculopapular generalized rash. She saw PCP and dermatology and no etiology was found. She then saw Dr. Marin Olp - suspicion for mastocytosis  - BM Bx negative.  She found out she is allergic to meat.  She stopped eating meat and the rash resolved.  Of note, no alpha gal allergy.  She was also investigated for gallstones.  + Mild CKD, last BUN/creatinine:  Lab Results  Component Value Date   BUN 13 11/29/2020   CREATININE 1.00 11/29/2020  07/20/2020 18/1.06, GFR 50 10/22/2018: ACR 11.8  + HL; last set of lipids: Lipid Panel w/reflex  2020-11-08   Cholesterol 206  <200  CHOL/HDL 5.1  2.0-4.0  HDLD 40  30-85  Triglyceride 363  0-199  NHDL 166  0-129  LDL Chol Calc (NIH) 104  0-99  LDL Chol Calc (NIH) 104  0-99  07/20/2020: 261/310/49/155 Lab Results  Component Value Date   CHOL 210 (H) 02/12/2020   HDL 44.60 02/12/2020   LDLCALC 131 (H) 11/01/2015   LDLDIRECT 134.0 02/12/2020   TRIG 253.0 (H) 02/12/2020   CHOLHDL 5 02/12/2020  10/22/2018: 228/352/39/119 Recent lipids: TG 600 >> started Fenofibrate >>  TG 200s 10/10/2016: 246/381/40/130   She tried statins >> Bone pain.  She tried Benecol spread but did not like it.  She also developed a rash from fenofibrate.  I suggested fish oil at last visit and suggested a referral to nutrition.  She did not have this appointment yet. She forgot about the fish oil. PCP started Zetia 10 mg daily.  - last eye exam was on Summer 2021: reportedly No DR  -No numbness and tingling in her feet.  She also has HTN, OSA, depression, osteoarthritis, spinal stenosis, osteopenia, GERD, CKD stage III. + h/o colon cancer.  Hypothyroidism:  Pt is on levothyroxine 112 mcg daily, taken: - in am - fasting - at least 30 min from b'fast - no calcium - no iron - no  multivitamins - no PPIs - not on Biotin She takes Pepcid in the morning, separated by 1 to 2 hours from levothyroxine.  Reviewed latest TSH levels: 07/20/2020: 2.22 Lab Results  Component Value Date   TSH 0.84 02/12/2020   TSH 2.140 03/22/2018  10/22/2018: TSH 0.76  In 2019 she was found to have elevated ATA antibodies: Lab Results  Component Value Date   THGAB 93.8 (H) 03/22/2018   We checked a thyroid ultrasound since she has a prominent left thyroid lobe on lobulation Thyroid U/S (03/03/2019): no nodules, heterogeneity and atrophy  ROS: Constitutional: no weight gain/no weight loss, no fatigue, no subjective hyperthermia, no subjective hypothermia Eyes: no blurry vision, no xerophthalmia ENT: no sore throat, no nodules palpated in neck, no dysphagia, no odynophagia, no hoarseness Cardiovascular: no CP/no SOB/no palpitations/no leg swelling Respiratory: no cough/no SOB/no wheezing Gastrointestinal: no N/no V/no D/no C/no acid reflux Musculoskeletal: no muscle aches/no joint aches Skin: no rashes, no hair loss Neurological: no tremors/no numbness/no tingling/no dizziness  I reviewed pt's medications, allergies, PMH, social hx, family hx, and changes were documented in the history of present illness. Otherwise, unchanged from my initial visit note.  Past Medical History:  Diagnosis Date  . Arthritis   . Colon cancer (Max)    colon ca dx 2008 and 2010  . Complication of anesthesia    problems waking up-patient thinks she was given too much medication  . Gestational diabetes   . Headache    headaches all the time  . Hypertension   . Sleep apnea    uses CPAP  . Urticaria    Past Surgical History:  Procedure Laterality Date  . ABDOMINAL HYSTERECTOMY    . APPENDECTOMY    . BACK SURGERY  2009  . CARPAL TUNNEL RELEASE    . COLON SURGERY    . COLONOSCOPY WITH PROPOFOL N/A 08/10/2015   Procedure: COLONOSCOPY WITH PROPOFOL;  Surgeon: Garlan Fair, MD;  Location: [facility]  ENDOSCOPY;  Service: Endoscopy;  Laterality: N/A;  . DILATION AND CURETTAGE OF UTERUS     had before had hysterectomy  . ESOPHAGOGASTRODUODENOSCOPY (EGD) WITH PROPOFOL N/A 08/10/2015   Procedure: ESOPHAGOGASTRODUODENOSCOPY (EGD) WITH PROPOFOL;  Surgeon: Garlan Fair, MD;  Location: [facility] ENDOSCOPY;  Service: Endoscopy;  Laterality: N/A;  . EYE SURGERY     bilateral cataract surgery   . ROTATOR CUFF REPAIR     Social History   Socioeconomic History  . Marital status: Divorced    Spouse name: Not on file  . Number of children: 4  . Years of education: 12+  . Highest education level: Not on file  Occupational History  . 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 Smoking status:  Never Smoker  . Smokeless tobacco: Never Used  . Tobacco comment: never used tobacco  Vaping Use  . Vaping Use: Never used  Substance and Sexual Activity  . Alcohol use: No    Alcohol/week: 0.0 standard drinks  . Drug use: No  . Sexual activity: Not on file  Other Topics Concern  . Not on file  Social History Narrative   Lives at home with herself.   Caffeine use: Drinks coffee and tea (1 cup coffee per day). Tea rarely    Social Determinants of Health   Financial Resource Strain: Not on file  Food Insecurity: Not on file  Transportation Needs: Not on file  Physical Activity: Not on file  Stress: Not on file  Social Connections: Not on file  Intimate Partner Violence: Not on file   Current Outpatient Medications on File Prior to Visit  Medication Sig Dispense Refill  . Accu-Chek Softclix Lancets lancets Use as instructed to check sugar once daily 100 each 0  . amLODipine (NORVASC) 5 MG tablet Take 5 mg by mouth Daily.    . Ascorbic Acid (VITAMIN C) 500 MG CAPS daily.    Marland Kitchen aspirin 81 MG tablet Take 81 mg by mouth daily.    . beta carotene w/minerals (OCUVITE) tablet Take 1 tablet by mouth daily.    . Blood Glucose Monitoring Suppl (ACCU-CHEK GUIDE ME) w/Device KIT Use as instructed to check  sugar once daily 1 kit 0  . budesonide (ENTOCORT EC) 3 MG 24 hr capsule Take by mouth daily as needed.    . Cholecalciferol (VITAMIN D-3 PO) Take 2,000 Units by mouth daily.    Marland Kitchen EPINEPHrine 0.3 mg/0.3 mL IJ SOAJ injection See admin instructions. (Patient not taking: Reported on 11/29/2020)    . ezetimibe (ZETIA) 10 MG tablet daily.    . famotidine (PEPCID) 20 MG tablet Take 20 mg by mouth 2 (two) times daily as needed.    . fexofenadine (ALLEGRA) 180 MG tablet Take 1 tablet (180 mg total) by mouth 2 (two) times daily. 60 tablet 3  . Fluocinolone Acetonide Body 0.01 % OIL APPLY OIL TOPICALLY TO AFFECTED AREA TWICE DAILY AS NEEDED (APPLY SPARINGLY)    . FLUoxetine (PROZAC) 20 MG capsule Take 20 mg by mouth Daily.    . fluticasone (FLONASE) 50 MCG/ACT nasal spray Place 1 spray into both nostrils daily.    . Glucosamine HCl 1500 MG TABS Take 1,500 mg by mouth daily.     Marland Kitchen glucose blood (ACCU-CHEK GUIDE) test strip Use as instructed to check sugar once daily 100 each 0  . levothyroxine (SYNTHROID, LEVOTHROID) 112 MCG tablet Take 112 mcg by mouth daily before breakfast.     . Magnesium 400 MG TABS daily.    . metFORMIN (GLUCOPHAGE-XR) 500 MG 24 hr tablet TAKE 1 TABLET BY MOUTH TWICE DAILY WITH A MEAL 180 tablet 0  . Multiple Vitamin (MULTIVITAMIN) capsule Take 1 capsule by mouth daily.    . polyvinyl alcohol (LIQUIFILM TEARS) 1.4 % ophthalmic solution Place 1 drop into both eyes as needed for dry eyes.    . Probiotic Product (PROBIOTIC DAILY) CAPS Take 1 capsule by mouth every morning.     . sitaGLIPtin (JANUVIA) 100 MG tablet Take 1 tablet (100 mg total) by mouth daily. 90 tablet 1  . triamterene-hydrochlorothiazide (DYAZIDE) 37.5-25 MG per capsule Take 1 capsule by mouth Daily.     . vitamin B-12 (CYANOCOBALAMIN) 100 MCG tablet daily.    . Zinc 30 MG CAPS  daily.     No current facility-administered medications on file prior to visit.   Allergies  Allergen Reactions  . Statins     Aches in her  bones   Family History  Problem Relation Age of Onset  . Migraines Neg Hx    PE: BP 140/70 (BP Location: Right Arm, Patient Position: Sitting, Cuff Size: Normal)   Pulse 77   Ht 5' 1"  (1.549 m)   Wt 157 lb 9.6 oz (71.5 kg)   SpO2 97%   BMI 29.78 kg/m  Body mass index is 29.78 kg/m. Wt Readings from Last 3 Encounters:  02/15/21 157 lb 9.6 oz (71.5 kg)  11/29/20 158 lb (71.7 kg)  08/13/20 155 lb 12.8 oz (70.7 kg)   Constitutional: Slight overweight, in NAD Eyes: PERRLA, EOMI, no exophthalmos ENT: moist mucous membranes, + slight left thyroid fullness, no cervical lymphadenopathy Cardiovascular: RRR, No MRG Respiratory: CTA B Gastrointestinal: abdomen soft, NT, ND, BS+ Musculoskeletal: no deformities, strength intact in all 4 Skin: moist, warm, no rashes Neurological: no tremor with outstretched hands, DTR normal in all 4  ASSESSMENT: 1. DM2, non-insulin-dependent, controlled, with long-term complications -Mild CKD  2. HL  3.  Hypothyroidism due to Hashimoto's thyroiditis  PLAN:  1. Patient with history of controlled diabetes, on oral antidiabetic regimen with half maximal metformin dose.  We could not increase the dose due to diarrhea.  However, her HbA1c levels were at goal, latest on 07/20/2020: 6.6%, higher, but still at goal.  We did not change her regimen at last visit.  However, she contacted me with slightly higher blood sugars recently.  I did suggest Januvia, but she mentions that this was costing her approximately $80 and she did not fill the prescription. -At today's visit,per the review of her meter download, sugars are almost all at goal in the last 2 weeks, but she is only checking later in the day.  Per review of her meter data, they were higher after 2-3 weeks ago, but at that time, she increased her metformin to 2 tablets in the morning and 1 tablet with dinner and sugars improved.  -For now, since sugars are almost all at goal, will continue the same dose of  metformin.  I refilled her prescription. - I suggested to:  Patient Instructions  Please continue Metformin ER 1000 mg in am and 500 mg with dinner.  Please start Fish oil 1000 mg 2x a day.  Please continue Levothyroxine 112 mcg daily.  Take the thyroid hormone every day, with water, at least 30 minutes before breakfast, separated by at least 4 hours from: - acid reflux medications - calcium - iron - multivitamins  Please return in 4-6 months with your sugar log.   - we checked her HbA1c: 7.1% (higher) - advised to check sugars at different times of the day - 1x a day, rotating check times - advised for yearly eye exams >> she is UTD - return to clinic in 4 months  2. HL - Reviewed latest lipid panels from 10/2020: LDL was better at last check, but triglycerides are higher -She is intolerant to statins.  Also, she had a rash with fenofibrate.  She did not start fish oil, but she was started on Zetia 10 mg daily before last visit.  She tolerates this well. -At this visit, I again suggested to start fish oil 1000 mg twice a day   3.  Hashimoto's hypothyroidism -In the past, her left thyroid lobe appeared more prominent on  palpation so we checked a thyroid ultrasound.  This was negative for nodules. - latest thyroid labs reviewed with pt >> normal: Lab Results  Component Value Date   TSH 0.84 02/12/2020   - she continues on LT4 112 mcg daily - pt feels good on this dose. - we discussed about taking the thyroid hormone every day, with water, >30 minutes before breakfast, separated by >4 hours from acid reflux medications, calcium, iron, multivitamins. Pt. is taking it correctly. - will check thyroid tests at next visit   Philemon Kingdom, MD PhD [facility]

## 2021-02-15 NOTE — Patient Instructions (Addendum)
Please continue Metformin ER 1000 mg in am and 500 mg with dinner.  Please start Fish oil 1000 mg 2x a day.  Please continue Levothyroxine 112 mcg daily.  Take the thyroid hormone every day, with water, at least 30 minutes before breakfast, separated by at least 4 hours from: - acid reflux medications - calcium - iron - multivitamins  Please return in 4-6 months with your sugar log.

## 2021-02-28 DIAGNOSIS — H40013 Open angle with borderline findings, low risk, bilateral: Secondary | ICD-10-CM | POA: Diagnosis not present

## 2021-02-28 DIAGNOSIS — H04123 Dry eye syndrome of bilateral lacrimal glands: Secondary | ICD-10-CM | POA: Diagnosis not present

## 2021-02-28 DIAGNOSIS — E119 Type 2 diabetes mellitus without complications: Secondary | ICD-10-CM | POA: Diagnosis not present

## 2021-02-28 DIAGNOSIS — H35033 Hypertensive retinopathy, bilateral: Secondary | ICD-10-CM | POA: Diagnosis not present

## 2021-02-28 LAB — HM DIABETES EYE EXAM

## 2021-05-12 DIAGNOSIS — M25561 Pain in right knee: Secondary | ICD-10-CM | POA: Diagnosis not present

## 2021-05-12 DIAGNOSIS — I10 Essential (primary) hypertension: Secondary | ICD-10-CM | POA: Diagnosis not present

## 2021-06-01 ENCOUNTER — Inpatient Hospital Stay: Payer: Medicare PPO | Attending: Hematology & Oncology

## 2021-06-01 ENCOUNTER — Telehealth: Payer: Self-pay

## 2021-06-01 ENCOUNTER — Inpatient Hospital Stay: Payer: Medicare PPO | Admitting: Hematology & Oncology

## 2021-06-01 ENCOUNTER — Other Ambulatory Visit: Payer: Self-pay

## 2021-06-01 ENCOUNTER — Encounter: Payer: Self-pay | Admitting: Hematology & Oncology

## 2021-06-01 VITALS — BP 155/62 | HR 59 | Temp 98.5°F | Resp 16 | Wt 153.0 lb

## 2021-06-01 DIAGNOSIS — C183 Malignant neoplasm of hepatic flexure: Secondary | ICD-10-CM | POA: Diagnosis not present

## 2021-06-01 DIAGNOSIS — Z85038 Personal history of other malignant neoplasm of large intestine: Secondary | ICD-10-CM | POA: Diagnosis not present

## 2021-06-01 DIAGNOSIS — C184 Malignant neoplasm of transverse colon: Secondary | ICD-10-CM

## 2021-06-01 LAB — CBC WITH DIFFERENTIAL (CANCER CENTER ONLY)
Abs Immature Granulocytes: 0.01 10*3/uL (ref 0.00–0.07)
Basophils Absolute: 0 10*3/uL (ref 0.0–0.1)
Basophils Relative: 1 %
Eosinophils Absolute: 0.2 10*3/uL (ref 0.0–0.5)
Eosinophils Relative: 4 %
HCT: 34.6 % — ABNORMAL LOW (ref 36.0–46.0)
Hemoglobin: 11.7 g/dL — ABNORMAL LOW (ref 12.0–15.0)
Immature Granulocytes: 0 %
Lymphocytes Relative: 35 %
Lymphs Abs: 2 10*3/uL (ref 0.7–4.0)
MCH: 30.5 pg (ref 26.0–34.0)
MCHC: 33.8 g/dL (ref 30.0–36.0)
MCV: 90.3 fL (ref 80.0–100.0)
Monocytes Absolute: 0.5 10*3/uL (ref 0.1–1.0)
Monocytes Relative: 9 %
Neutro Abs: 3 10*3/uL (ref 1.7–7.7)
Neutrophils Relative %: 51 %
Platelet Count: 179 10*3/uL (ref 150–400)
RBC: 3.83 MIL/uL — ABNORMAL LOW (ref 3.87–5.11)
RDW: 12.5 % (ref 11.5–15.5)
WBC Count: 5.8 10*3/uL (ref 4.0–10.5)
nRBC: 0 % (ref 0.0–0.2)

## 2021-06-01 LAB — CMP (CANCER CENTER ONLY)
ALT: 32 U/L (ref 0–44)
AST: 23 U/L (ref 15–41)
Albumin: 4.4 g/dL (ref 3.5–5.0)
Alkaline Phosphatase: 43 U/L (ref 38–126)
Anion gap: 10 (ref 5–15)
BUN: 16 mg/dL (ref 8–23)
CO2: 29 mmol/L (ref 22–32)
Calcium: 10 mg/dL (ref 8.9–10.3)
Chloride: 102 mmol/L (ref 98–111)
Creatinine: 1.01 mg/dL — ABNORMAL HIGH (ref 0.44–1.00)
GFR, Estimated: 56 mL/min — ABNORMAL LOW (ref >60)
Glucose, Bld: 125 mg/dL — ABNORMAL HIGH (ref 70–99)
Potassium: 3.8 mmol/L (ref 3.5–5.1)
Sodium: 141 mmol/L (ref 135–145)
Total Bilirubin: 0.6 mg/dL (ref 0.3–1.2)
Total Protein: 6.6 g/dL (ref 6.5–8.1)

## 2021-06-01 LAB — IRON AND TIBC
Iron: 102 ug/dL (ref 41–142)
Saturation Ratios: 28 % (ref 21–57)
TIBC: 368 ug/dL (ref 236–444)
UIBC: 266 ug/dL (ref 120–384)

## 2021-06-01 LAB — RETICULOCYTES
Immature Retic Fract: 11.7 % (ref 2.3–15.9)
RBC.: 3.82 MIL/uL — ABNORMAL LOW (ref 3.87–5.11)
Retic Count, Absolute: 71.1 10*3/uL (ref 19.0–186.0)
Retic Ct Pct: 1.9 % (ref 0.4–3.1)

## 2021-06-01 LAB — CEA (IN HOUSE-CHCC): CEA (CHCC-In House): <1 ng/mL (ref 0.00–5.00)

## 2021-06-01 LAB — FERRITIN: Ferritin: 45 ng/mL (ref 11–307)

## 2021-06-01 NOTE — Progress Notes (Signed)
Hematology and Oncology Follow Up Visit  Robin [name] [id number] 1941-03-01 80 y.o. 06/01/2021   Principle Diagnosis:  History of locally recurrent adenocarcinoma of the colon  Current Therapy:   Observation    Interim History:  Robin [name] is here today for follow-up. He is here every 6 months. So far, she been doing quite well.  She now has a new great grandson.  He was born in May.  I was very excited about this for her.  She has had a good summer.  She is down at a son's house in [place].  She had a wonderful time down there.  She has had no problems with cough.  There is no nausea or vomiting.  There is no change in bowel or bladder habits.  Thankfully, she is avoided the coronavirus.  She has had no problems with leg swelling.  There is no rashes.  Her last CEA level back in March was 1.92.  Overall, I would say her performance status is ECOG 1.   Medications:  Allergies as of 06/01/2021       Reactions   Statins    Aches in her bones        Medication List        Accurate as of June 01, 2021 11:42 AM. If you have any questions, ask your nurse or doctor.          STOP taking these medications    sitaGLIPtin 100 MG tablet Commonly known as: Januvia Stopped by: [name], MD       TAKE these medications    Accu-Chek Guide Me w/Device Kit Use as instructed to check sugar once daily   Accu-Chek Guide test strip Generic drug: glucose blood Use as instructed to check sugar once daily   Accu-Chek Softclix Lancets lancets Use as instructed to check sugar once daily   amLODipine 10 MG tablet Commonly known as: NORVASC Take 10 mg by mouth Daily.   aspirin 81 MG tablet Take 81 mg by mouth daily.   beta carotene w/minerals tablet Take 1 tablet by mouth daily.   budesonide 3 MG 24 hr capsule Commonly known as: ENTOCORT EC Take by mouth daily as needed.   EPINEPHrine 0.3 mg/0.3 mL Soaj injection Commonly known as: EPI-PEN See  admin instructions.   ezetimibe 10 MG tablet Commonly known as: ZETIA daily.   famotidine 20 MG tablet Commonly known as: PEPCID Take 20 mg by mouth 2 (two) times daily as needed.   fexofenadine 180 MG tablet Commonly known as: ALLEGRA Take 1 tablet (180 mg total) by mouth 2 (two) times daily.   Fluocinolone Acetonide Body 0.01 % Oil APPLY OIL TOPICALLY TO AFFECTED AREA TWICE DAILY AS NEEDED (APPLY SPARINGLY)   FLUoxetine 20 MG capsule Commonly known as: PROZAC Take 20 mg by mouth Daily.   fluticasone 50 MCG/ACT nasal spray Commonly known as: FLONASE Place 1 spray into both nostrils daily.   Glucosamine HCl 1500 MG Tabs Take 1,500 mg by mouth daily.   levothyroxine 112 MCG tablet Commonly known as: SYNTHROID Take 112 mcg by mouth daily before breakfast.   Magnesium 400 MG Tabs daily.   metFORMIN 500 MG 24 hr tablet Commonly known as: GLUCOPHAGE-XR Take by mouth 2 tablets am 1 tablet pm   multivitamin capsule Take 1 capsule by mouth daily.   polyvinyl alcohol 1.4 % ophthalmic solution Commonly known as: LIQUIFILM TEARS Place 1 drop into both eyes as needed for dry eyes.  Probiotic Daily Caps Take 1 capsule by mouth every morning.   triamterene-hydrochlorothiazide 37.5-25 MG capsule Commonly known as: DYAZIDE Take 1 capsule by mouth Daily.   vitamin B-12 100 MCG tablet Commonly known as: CYANOCOBALAMIN daily.   Vitamin C 500 MG Caps daily.   VITAMIN D-3 PO Take 2,000 Units by mouth daily.   Zinc 30 MG Caps daily.        Allergies:  Allergies  Allergen Reactions   Statins     Aches in her bones    Past Medical History, Surgical history, Social history, and Family History were reviewed and updated.  Review of Systems: Review of Systems  Constitutional: Negative.   HENT: Negative.    Eyes: Negative.   Respiratory: Negative.    Cardiovascular: Negative.   Gastrointestinal: Negative.   Genitourinary: Negative.   Musculoskeletal:  Negative.   Skin: Negative.   Endo/Heme/Allergies: Negative.   Psychiatric/Behavioral: Negative.     Physical Exam:  weight is 153 lb (69.4 kg). Her oral temperature is 98.5 F (36.9 C). Her blood pressure is 155/62 (abnormal) and her pulse is 59 (abnormal). Her respiration is 16 and oxygen saturation is 97%.   Wt Readings from Last 3 Encounters:  06/01/21 153 lb (69.4 kg)  02/15/21 157 lb 9.6 oz (71.5 kg)  11/29/20 158 lb (71.7 kg)    Physical Exam Vitals reviewed.  HENT:     Head: Normocephalic and atraumatic.  Eyes:     Pupils: Pupils are equal, round, and reactive to light.  Cardiovascular:     Rate and Rhythm: Normal rate and regular rhythm.     Heart sounds: Normal heart sounds.  Pulmonary:     Effort: Pulmonary effort is normal.     Breath sounds: Normal breath sounds.  Abdominal:     General: Bowel sounds are normal.     Palpations: Abdomen is soft.  Musculoskeletal:        General: No tenderness or deformity. Normal range of motion.     Cervical back: Normal range of motion.  Lymphadenopathy:     Cervical: No cervical adenopathy.  Skin:    General: Skin is warm and dry.     Findings: No erythema or rash.  Neurological:     Mental Status: She is alert and oriented to person, place, and time.  Psychiatric:        Behavior: Behavior normal.        Thought Content: Thought content normal.        Judgment: Judgment normal.     Lab Results  Component Value Date   WBC 5.8 06/01/2021   HGB 11.7 (L) 06/01/2021   HCT 34.6 (L) 06/01/2021   MCV 90.3 06/01/2021   PLT 179 06/01/2021   Lab Results  Component Value Date   FERRITIN 250 10/14/2009   Lab Results  Component Value Date   RETICCTPCT 1.9 06/01/2021   RBC 3.83 (L) 06/01/2021   RBC 3.82 (L) 06/01/2021   RETICCTABS 48.7 12/17/2008   No results found for: KPAFRELGTCHN, LAMBDASER, KAPLAMBRATIO No results found for: IGGSERUM, IGA, IGMSERUM No results found for: Odetta Pink, SPEI   Chemistry      Component Value Date/Time   NA 141 06/01/2021 1027   NA 142 03/22/2018 1059   NA 139 05/21/2017 0838   K 3.8 06/01/2021 1027   K 4.3 05/21/2017 0838   CL 102 06/01/2021 1027   CL 102 05/18/2016 1505   CL 100 11/01/2015  0932   CO2 29 06/01/2021 1027   CO2 28 05/21/2017 0838   BUN 16 06/01/2021 1027   BUN 13 03/22/2018 1059   BUN 20.8 05/21/2017 0838   CREATININE 1.01 (H) 06/01/2021 1027   CREATININE 1.0 05/21/2017 0838      Component Value Date/Time   CALCIUM 10.0 06/01/2021 1027   CALCIUM 10.5 (H) 05/21/2017 0838   ALKPHOS 43 06/01/2021 1027   ALKPHOS 49 05/21/2017 0838   AST 23 06/01/2021 1027   AST 22 05/21/2017 0838   ALT 32 06/01/2021 1027   ALT 33 05/21/2017 0838   BILITOT 0.6 06/01/2021 1027   BILITOT 0.57 05/21/2017 0838      Impression and Plan: Ms. Ebrahimi is a very pleasant 80 yo caucasian female with history of locally recurrent adenocarcinoma of the colon with resection in 2000. She received adjuvant FOLFOX.  I see no evidence of recurrent disease.  For right now, we will just follow her up in another 6 months.  I do not see any need for any type of scans right now. I am just glad that her quality life is doing well. Sound like she will have a good rest of the year.  I am so happy about the great grandson.   [name], MD 9/7/202211:42 AM

## 2021-06-01 NOTE — Telephone Encounter (Signed)
Appts made and mailed to pt per 06/01/21 los  Nassim Cosma

## 2021-06-10 DIAGNOSIS — E1122 Type 2 diabetes mellitus with diabetic chronic kidney disease: Secondary | ICD-10-CM | POA: Diagnosis not present

## 2021-06-10 DIAGNOSIS — E11620 Type 2 diabetes mellitus with diabetic dermatitis: Secondary | ICD-10-CM | POA: Diagnosis not present

## 2021-06-10 DIAGNOSIS — F3341 Major depressive disorder, recurrent, in partial remission: Secondary | ICD-10-CM | POA: Diagnosis not present

## 2021-06-10 DIAGNOSIS — G8929 Other chronic pain: Secondary | ICD-10-CM | POA: Diagnosis not present

## 2021-06-10 DIAGNOSIS — E785 Hyperlipidemia, unspecified: Secondary | ICD-10-CM | POA: Diagnosis not present

## 2021-06-10 DIAGNOSIS — I129 Hypertensive chronic kidney disease with stage 1 through stage 4 chronic kidney disease, or unspecified chronic kidney disease: Secondary | ICD-10-CM | POA: Diagnosis not present

## 2021-06-10 DIAGNOSIS — G4733 Obstructive sleep apnea (adult) (pediatric): Secondary | ICD-10-CM | POA: Diagnosis not present

## 2021-06-10 DIAGNOSIS — E663 Overweight: Secondary | ICD-10-CM | POA: Diagnosis not present

## 2021-06-10 DIAGNOSIS — E039 Hypothyroidism, unspecified: Secondary | ICD-10-CM | POA: Diagnosis not present

## 2021-06-22 DIAGNOSIS — M25561 Pain in right knee: Secondary | ICD-10-CM | POA: Diagnosis not present

## 2021-06-28 ENCOUNTER — Ambulatory Visit
Admission: RE | Admit: 2021-06-28 | Discharge: 2021-06-28 | Disposition: A | Discharge status: Home / Self Care | Payer: Medicare PPO | Source: Ambulatory Visit | Attending: Sports Medicine | Admitting: Sports Medicine

## 2021-06-28 ENCOUNTER — Other Ambulatory Visit: Payer: Self-pay | Admitting: Sports Medicine

## 2021-06-28 DIAGNOSIS — M1711 Unilateral primary osteoarthritis, right knee: Secondary | ICD-10-CM | POA: Diagnosis not present

## 2021-06-28 DIAGNOSIS — M25561 Pain in right knee: Secondary | ICD-10-CM

## 2021-08-12 DIAGNOSIS — R0789 Other chest pain: Secondary | ICD-10-CM | POA: Diagnosis not present

## 2021-08-12 DIAGNOSIS — Z7984 Long term (current) use of oral hypoglycemic drugs: Secondary | ICD-10-CM | POA: Diagnosis not present

## 2021-08-12 DIAGNOSIS — E1122 Type 2 diabetes mellitus with diabetic chronic kidney disease: Secondary | ICD-10-CM | POA: Diagnosis not present

## 2021-08-12 DIAGNOSIS — N183 Chronic kidney disease, stage 3 unspecified: Secondary | ICD-10-CM | POA: Diagnosis not present

## 2021-08-12 DIAGNOSIS — E782 Mixed hyperlipidemia: Secondary | ICD-10-CM | POA: Diagnosis not present

## 2021-08-12 DIAGNOSIS — I10 Essential (primary) hypertension: Secondary | ICD-10-CM | POA: Diagnosis not present

## 2021-08-12 DIAGNOSIS — R0609 Other forms of dyspnea: Secondary | ICD-10-CM | POA: Diagnosis not present

## 2021-08-23 NOTE — Progress Notes (Signed)
Patient ID: Robin [name], female   DOB: 03-26-1941, 80 y.o.   MRN: [id number]  This visit occurred during the SARS-CoV-2 public health emergency.  Safety protocols were in place, including screening questions prior to the visit, additional usage of staff PPE, and extensive cleaning of exam room while observing appropriate contact time as indicated for disinfecting solutions.   HPI: Robin [name] is a 80 y.o.-year-old female, returning for f/u for DM2, initially GDM, then DM2 dx in 05/2015, non-insulin-dependent, controlled, with complications (mild CKD). Last visit 6 months ago.  Interim history: No increased urination, blurry vision, nausea, chest pain. She had an EKG recently for SOB - EKG was improved from before. D-dimer was not elevated per review of [facility] records. Will see cardiology in 1 week.  Reviewed HbA1c levels: Lab Results  Component Value Date   HGBA1C 7.1 (A) 02/15/2021   HGBA1C 6.0 (A) 02/12/2020   HGBA1C 6.2 (A) 08/14/2019  07/20/2020: HbA1c 6.6%  Pt is on a regimen of: - Metformin ER 1000 mg with dinner -she had diarrhea with higher doses >> 500 mg twice a day >> 1000 mg in am and 500 mg with dinner She did not start Januvia - $.  Pt checks her sugars once a day per review of her meter downloads and her report: - am: 186 x1 >> n/c >> 123 >> 103 >> 141, 168 >> 153 - 2h after b'fast: n/c >> 74 >> 126 - before lunch: 86-98 >> 58,  90-95 >> 74 >> 98 >> 62 - 2h after lunch:  79, 86 >> 145 - before dinner: 94-122, 159 >> n/c >> 62, 87, 102-131, 150, 168, 172 >> 189 - 2h after dinner:  83 >> 120-130 >> n/c >> 110 >> 121 - bedtime: 115, 116 >> 94  >> n/c >> 139 >> n/c - nighttime: n/c Lowest sugar was 58 >> 74 >> 62; she has hypoglycemia awareness in the 70s. Highest sugar was 186 >> 159 >> 172 >> 189.  Glucometer: One Touch  Pt's meals are: - Breakfast: cheerios + raisin bran or cornflakes + 2% milk or bacon + egg + toast - Lunch: salad or soup or sandwich -  Dinner: veggies + starch - Snacks: no desserts; apple, chips She saw a dietitian 07/09/2015-this helped a lot. In 2019 had a pruritic maculopapular generalized rash. She saw PCP and dermatology and no etiology was found. She then saw Dr. Marin Olp - suspicion for mastocytosis  - BM Bx negative.  She found out she is allergic to meat.  She stopped eating meat and the rash resolved.  Of note, no alpha gal allergy.  She was also investigated for gallstones.  + Mild CKD, last BUN/creatinine:  08/12/2021: Glu 143, BUN/Cr 21/1.19, GFR 46, Calcium 69.6 Lab Results  Component Value Date   BUN 16 06/01/2021   CREATININE 1.01 (H) 06/01/2021  07/20/2020 18/1.06, GFR 50 10/22/2018: ACR 11.8  + HL; last set of lipids: Lipid Panel w/reflex   2020-11-08    Cholesterol 206   <200  CHOL/HDL 5.1   2.0-4.0  HDLD 40   30-85  Triglyceride 363   0-199  NHDL 166   0-129  LDL Chol Calc (NIH) 104   0-99  LDL Chol Calc (NIH) 104   0-99  07/20/2020: 261/310/49/155 Lab Results  Component Value Date   CHOL 210 (H) 02/12/2020   HDL 44.60 02/12/2020   LDLCALC 131 (H) 11/01/2015   LDLDIRECT 134.0 02/12/2020   TRIG 253.0 (H) 02/12/2020  CHOLHDL 5 02/12/2020  10/22/2018: 228/352/39/119 Recent lipids: TG 600 >> started Fenofibrate >> TG 200s 10/10/2016: 246/381/40/130   She tried statins >> Bone pain.  She tried Benecol spread but did not like it.  She also developed a rash from fenofibrate. I suggested a referral to nutrition but she did not have this appointment. PCP started Zetia 10 mg daily.  At last visit I advised her to add fish oil 1000 mg twice a day - started.  - last eye exam was in June 2022: No DR  -No numbness and tingling in her feet.  She also has HTN, OSA, depression, osteoarthritis, spinal stenosis, osteopenia, GERD, CKD stage III. + h/o colon cancer.  Hashimoto's hypothyroidism:  Pt is on levothyroxine 112 mcg daily, taken: - in am - fasting - at least 30 min from b'fast - no calcium -  no iron - no multivitamins - no PPIs - not on Biotin She takes Pepcid in the morning, separated by 1 to 2 hours from levothyroxine.  Reviewed latest TSH levels: 07/20/2020: 2.22 Lab Results  Component Value Date   TSH 0.84 02/12/2020   TSH 2.140 03/22/2018  10/22/2018: TSH 0.76  In 2019 she was found to have elevated ATA antibodies: Lab Results  Component Value Date   THGAB 93.8 (H) 03/22/2018   We checked a thyroid ultrasound since she has a prominent left thyroid lobe on palpation: Thyroid U/S (03/03/2019): no nodules, heterogeneity and atrophy  ROS: + see HPI  I reviewed pt's medications, allergies, PMH, social hx, family hx, and changes were documented in the history of present illness. Otherwise, unchanged from my initial visit note.  Past Medical History:  Diagnosis Date   Arthritis    Colon cancer ([place])    colon ca dx 6222 and 9798   Complication of anesthesia    problems waking up-patient thinks she was given too much medication   Gestational diabetes    Headache    headaches all the time   Hypertension    Sleep apnea    uses CPAP   Urticaria    Past Surgical History:  Procedure Laterality Date   ABDOMINAL HYSTERECTOMY     APPENDECTOMY     BACK SURGERY  2009   CARPAL TUNNEL RELEASE     COLON SURGERY     COLONOSCOPY WITH PROPOFOL N/A 08/10/2015   Procedure: COLONOSCOPY WITH PROPOFOL;  Surgeon: Garlan Fair, MD;  Location: [facility];  Service: Endoscopy;  Laterality: N/A;   DILATION AND CURETTAGE OF UTERUS     had before had hysterectomy   ESOPHAGOGASTRODUODENOSCOPY (EGD) WITH PROPOFOL N/A 08/10/2015   Procedure: ESOPHAGOGASTRODUODENOSCOPY (EGD) WITH PROPOFOL;  Surgeon: Garlan Fair, MD;  Location: [facility];  Service: Endoscopy;  Laterality: N/A;   EYE SURGERY     bilateral cataract surgery    ROTATOR CUFF REPAIR     Social History   Socioeconomic History   Marital status: Divorced    Spouse name: Not on file   Number of children: 4    Years of education: 12+   Highest education level: Not on file  Occupational History   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   Caffeine use: Drinks coffee and tea (1 cup coffee per day). Tea rarely    Social Determinants of Radio broadcast assistant Strain: Not on file  Food Insecurity: Not on file  Transportation Needs: Not on file  Physical Activity: Not on file  Stress: Not on file  Social Connections: Not on file  Intimate Partner Violence: Not on file   Current Outpatient Medications on File Prior to Visit  Medication Sig Dispense Refill   Accu-Chek Softclix Lancets lancets Use as instructed to check sugar once daily 100 each 0   amLODipine (NORVASC) 10 MG tablet Take 10 mg by mouth Daily.     Ascorbic Acid (VITAMIN C) 500 MG CAPS daily.     aspirin 81 MG tablet Take 81 mg by mouth daily.     beta carotene w/minerals (OCUVITE) tablet Take 1 tablet by mouth daily.     Blood Glucose Monitoring Suppl (ACCU-CHEK GUIDE ME) w/Device KIT Use as instructed to check sugar once daily 1 kit 0   budesonide (ENTOCORT EC) 3 MG 24 hr capsule Take by mouth daily as needed.     Cholecalciferol (VITAMIN D-3 PO) Take 2,000 Units by mouth daily.     EPINEPHrine 0.3 mg/0.3 mL IJ SOAJ injection See admin instructions.     ezetimibe (ZETIA) 10 MG tablet daily.     famotidine (PEPCID) 20 MG tablet Take 20 mg by mouth 2 (two) times daily as needed.     fexofenadine (ALLEGRA) 180 MG tablet Take 1 tablet (180 mg total) by mouth 2 (two) times daily. 60 tablet 3   Fluocinolone Acetonide Body 0.01 % OIL APPLY OIL TOPICALLY TO AFFECTED AREA  TWICE DAILY AS NEEDED (APPLY SPARINGLY)     FLUoxetine (PROZAC) 20 MG capsule Take 20 mg by mouth Daily.     fluticasone (FLONASE) 50 MCG/ACT nasal spray Place 1 spray into both nostrils daily.     Glucosamine HCl 1500 MG TABS Take 1,500 mg by mouth daily.      glucose blood (ACCU-CHEK GUIDE) test strip Use as instructed to check sugar once daily 100 each 0   levothyroxine (SYNTHROID, LEVOTHROID) 112 MCG tablet Take 112 mcg by mouth daily before breakfast.      Magnesium 400 MG TABS daily.     metFORMIN (GLUCOPHAGE-XR) 500 MG 24 hr tablet Take by mouth 2 tablets am 1 tablet pm 270 tablet 3   Multiple Vitamin (MULTIVITAMIN) capsule Take 1 capsule by mouth daily.     polyvinyl alcohol (LIQUIFILM TEARS) 1.4 % ophthalmic solution Place 1 drop into both eyes as needed for dry eyes.     Probiotic Product (PROBIOTIC DAILY) CAPS Take 1 capsule by mouth every morning.      triamterene-hydrochlorothiazide (DYAZIDE) 37.5-25 MG per capsule Take 1 capsule by mouth Daily.      vitamin B-12 (CYANOCOBALAMIN) 100 MCG tablet daily.     Zinc 30 MG CAPS daily.     No current facility-administered medications on file prior to visit.   Allergies  Allergen Reactions   Statins     Aches in her bones   Family History  Problem Relation Age of Onset   Migraines Neg Hx    PE: BP 130/60 (BP Location: Left Arm, Patient Position: Sitting, Cuff Size: Normal)   Pulse 60   Ht 5' 1"  (1.549 m)   Wt 152 lb  12.8 oz (69.3 kg)   SpO2 97%   BMI 28.87 kg/m  Body mass index is 28.87 kg/m. Wt Readings from Last 3 Encounters:  08/24/21 152 lb 12.8 oz (69.3 kg)  06/01/21 153 lb (69.4 kg)  02/15/21 157 lb 9.6 oz (71.5 kg)   Constitutional: Slight overweight, in NAD Eyes: PERRLA, EOMI, no exophthalmos ENT: moist mucous membranes, + slight left thyroid fullness, no cervical lymphadenopathy Cardiovascular: RRR, No MRG Respiratory: CTA B Musculoskeletal: no deformities, strength intact in all 4 Skin: moist, warm, no  rashes Neurological: no tremor with outstretched hands, DTR normal in all 4  ASSESSMENT: 1. DM2, non-insulin-dependent, controlled, with long-term complications -Mild CKD  2. HL  3.  Hypothyroidism due to Hashimoto's thyroiditis  PLAN:  1. Patient with history of controlled diabetes, on oral antidiabetic regimen with metformin.  We could not increase the dose to 2000 mg daily due to diarrhea.  However, her HbA1c levels have been controlled fairly well so we did not have to escalate her regimen.  At last visit, however, sugars were higher 2 to 3 weeks before the visit but she increased her metformin to 2 tablets in the morning and 1 tablet at night and sugars improved.  She was tolerating well the regimen so we did not change it.  Of note, we tried to add Januvia in the past but this was expensive so she could not fill the prescription. -At this visit, reviewing her meter downloads, she did not check consistently her blood sugars-we only have 4 values in the last 2 weeks.  I advised her that this is insufficient to make changes in her regimen.  The blood sugars that she checks are quite variable, with the lowest being 62 when she was more active around Thanksgiving, but she also had a higher blood sugar at 189 before dinner.  For now, I did advise him to continue the same metformin ER dose but I gave her blood sugar logs and I advised her to write her blood sugars down and put down possible reasons for the abnormal values in the log.  I advised her to at least do this 1 to 2 weeks prior to her next visit. - I suggested to:  Patient Instructions  Please continue Metformin ER 1000 mg in am and 500 mg with dinner.  Check sugars 1x a day, rotating check times and write them down.  Continue Fish oil 1000 mg 2x a day.  Continue Levothyroxine 112 mcg daily.  Take the thyroid hormone every day, with water, at least 30 minutes before breakfast, separated by at least 4 hours from: - acid reflux  medications - calcium - iron - multivitamins  Please return in 4 months with your sugar log.   - we checked her HbA1c: 7.0% (slightly lower) - advised to check sugars at different times of the day - 1x a day, rotating check times - advised for yearly eye exams >> she is UTD - return to clinic in 4-6 months  2. HL -Reviewed latest lipid panel from 10/2020: LDL was better, but triglycerides are higher. -She is intolerant to statins.  Also, she had a rash with fenofibrate.  On Zetia 10 mg daily.  She tolerates this well. -At last visit, I advised her to start fish oil 1000 mg twice a day.  She is taking this now. -She has an appointment with PCP in 10/2020.   3.  Hashimoto's hypothyroidism -In the past, her left thyroid lobe appeared more prominent on  palpation so we checked a thyroid ultrasound.  This was negative for nodules. - latest thyroid labs reviewed with pt. >> normal 06/2020 - she continues on LT4 112 mcg daily - pt feels good on this dose. - we discussed about taking the thyroid hormone every day, with water, >30 minutes before breakfast, separated by >4 hours from acid reflux medications, calcium, iron, multivitamins. Pt. is taking it correctly. - will check thyroid tests today: TSH and fT4 - If labs are abnormal, she will need to return for repeat TFTs in 1.5 months  Component     Latest Ref Rng & Units 08/24/2021  TSH     0.35 - 5.50 uIU/mL 0.57  T4,Free(Direct)     0.60 - 1.60 ng/dL 1.25  TFTs are normal.  Philemon Kingdom, MD PhD [facility] Endocrinology

## 2021-08-24 ENCOUNTER — Ambulatory Visit (INDEPENDENT_AMBULATORY_CARE_PROVIDER_SITE_OTHER): Payer: Medicare PPO | Admitting: Internal Medicine

## 2021-08-24 ENCOUNTER — Encounter: Payer: Self-pay | Admitting: Internal Medicine

## 2021-08-24 ENCOUNTER — Other Ambulatory Visit: Payer: Self-pay

## 2021-08-24 VITALS — BP 130/60 | HR 60 | Ht 61.0 in | Wt 152.8 lb

## 2021-08-24 DIAGNOSIS — E038 Other specified hypothyroidism: Secondary | ICD-10-CM | POA: Diagnosis not present

## 2021-08-24 DIAGNOSIS — E1121 Type 2 diabetes mellitus with diabetic nephropathy: Secondary | ICD-10-CM | POA: Diagnosis not present

## 2021-08-24 DIAGNOSIS — E785 Hyperlipidemia, unspecified: Secondary | ICD-10-CM

## 2021-08-24 DIAGNOSIS — E1169 Type 2 diabetes mellitus with other specified complication: Secondary | ICD-10-CM | POA: Diagnosis not present

## 2021-08-24 DIAGNOSIS — E063 Autoimmune thyroiditis: Secondary | ICD-10-CM

## 2021-08-24 LAB — POCT GLYCOSYLATED HEMOGLOBIN (HGB A1C): Hemoglobin A1C: 7 % — AB (ref 4.0–5.6)

## 2021-08-24 LAB — TSH: TSH: 0.57 u[IU]/mL (ref 0.35–5.50)

## 2021-08-24 LAB — T4, FREE: Free T4: 1.25 ng/dL (ref 0.60–1.60)

## 2021-08-24 NOTE — Patient Instructions (Addendum)
Please continue Metformin ER 1000 mg in am and 500 mg with dinner.  Check sugars 1x a day, rotating check times and write them down.  Continue Fish oil 1000 mg 2x a day.  Continue Levothyroxine 112 mcg daily.  Take the thyroid hormone every day, with water, at least 30 minutes before breakfast, separated by at least 4 hours from: - acid reflux medications - calcium - iron - multivitamins  Please return in 4 months with your sugar log.

## 2021-08-28 NOTE — Progress Notes (Signed)
Cardiology Office Note   Date:  08/30/2021   ID:  Robin [name], DOB 1940-12-15, MRN [id number]  PCP:  Kathyrn Lass, MD  Cardiologist:   Deltha Bernales [place], MD   Chief Complaint  Patient presents with   Chest Pain   Shortness of Breath      History of Present Illness: Robin [name] is a 80 y.o. female who is seen at the request of Faustino Congress NP for evaluation of chest pain and DOE. She has a history of OSA, DM, HLD, and HTN.  She reports that 2 weeks ago she developed pain in the chest and SOB. Describes the pain as a tightness in the central chest without radiation. Some SOB. Comes and goes. Thinks may be related to meals. No prior cardiac evaluation. Does have a strong family history of CAD. Intolerant to statins due to myalgias. She is still working 4 hours/day at [employer]. Lives alone.   Past Medical History:  Diagnosis Date   Arthritis    Colon cancer ([place])    colon ca dx 1121 and 6244   Complication of anesthesia    problems waking up-patient thinks she was given too much medication   Gestational diabetes    Headache    headaches all the time   Hyperlipidemia    Hypertension    Sleep apnea    uses CPAP   Urticaria     Past Surgical History:  Procedure Laterality Date   ABDOMINAL HYSTERECTOMY     APPENDECTOMY     BACK SURGERY  2009   CARPAL TUNNEL RELEASE     COLON SURGERY     COLONOSCOPY WITH PROPOFOL N/A 08/10/2015   Procedure: COLONOSCOPY WITH PROPOFOL;  Surgeon: Garlan Fair, MD;  Location: [facility];  Service: Endoscopy;  Laterality: N/A;   DILATION AND CURETTAGE OF UTERUS     had before had hysterectomy   ESOPHAGOGASTRODUODENOSCOPY (EGD) WITH PROPOFOL N/A 08/10/2015   Procedure: ESOPHAGOGASTRODUODENOSCOPY (EGD) WITH PROPOFOL;  Surgeon: Garlan Fair, MD;  Location: [facility];  Service: Endoscopy;  Laterality: N/A;   EYE SURGERY     bilateral cataract surgery    ROTATOR CUFF REPAIR       Current Outpatient Medications  Medication  Sig Dispense Refill   Accu-Chek Softclix Lancets lancets Use as instructed to check sugar once daily 100 each 0   amLODipine (NORVASC) 10 MG tablet Take 10 mg by mouth Daily.     Ascorbic Acid (VITAMIN C) 500 MG CAPS daily.     aspirin 81 MG tablet Take 81 mg by mouth daily.     beta carotene w/minerals (OCUVITE) tablet Take 1 tablet by mouth daily.     Blood Glucose Monitoring Suppl (ACCU-CHEK GUIDE ME) w/Device KIT Use as instructed to check sugar once daily 1 kit 0   budesonide (ENTOCORT EC) 3 MG 24 hr capsule Take by mouth daily as needed.     Cholecalciferol (VITAMIN D-3 PO) Take 2,000 Units by mouth daily.     EPINEPHrine 0.3 mg/0.3 mL IJ SOAJ injection See admin instructions.     ezetimibe (ZETIA) 10 MG tablet daily.     famotidine (PEPCID) 20 MG tablet Take 20 mg by mouth 2 (two) times daily as needed.     fexofenadine (ALLEGRA) 180 MG tablet Take 1 tablet (180 mg total) by mouth 2 (two) times daily. 60 tablet 3   Fluocinolone Acetonide Body 0.01 % OIL APPLY OIL TOPICALLY TO AFFECTED AREA TWICE DAILY AS NEEDED (APPLY  SPARINGLY)     FLUoxetine (PROZAC) 20 MG capsule Take 20 mg by mouth Daily.     fluticasone (FLONASE) 50 MCG/ACT nasal spray Place 1 spray into both nostrils daily.     Glucosamine HCl 1500 MG TABS Take 1,500 mg by mouth daily.      glucose blood (ACCU-CHEK GUIDE) test strip Use as instructed to check sugar once daily 100 each 0   levothyroxine (SYNTHROID, LEVOTHROID) 112 MCG tablet Take 112 mcg by mouth daily before breakfast.      Magnesium 400 MG TABS daily.     metFORMIN (GLUCOPHAGE-XR) 500 MG 24 hr tablet Take by mouth 2 tablets am 1 tablet pm 270 tablet 3   metoprolol tartrate (LOPRESSOR) 50 MG tablet Take 50 mg 2 hours before Coronary CT 1 tablet 0   Multiple Vitamin (MULTIVITAMIN) capsule Take 1 capsule by mouth daily.     polyvinyl alcohol (LIQUIFILM TEARS) 1.4 % ophthalmic solution Place 1 drop into both eyes as needed for dry eyes.     Probiotic Product  (PROBIOTIC DAILY) CAPS Take 1 capsule by mouth every morning.      triamterene-hydrochlorothiazide (DYAZIDE) 37.5-25 MG per capsule Take 1 capsule by mouth Daily.      vitamin B-12 (CYANOCOBALAMIN) 100 MCG tablet daily.     Zinc 30 MG CAPS daily.     No current facility-administered medications for this visit.    Allergies:   Ace inhibitors and Statins    Social History:  The patient  reports that she has never smoked. She has never used smokeless tobacco. She reports that she does not drink alcohol and does not use drugs.   Family History:  The patient's family history includes Aneurysm (age of onset: 32) in her brother; Heart attack (age of onset: 81) in her brother; Heart attack (age of onset: 17) in her brother; Heart attack (age of onset: 70) in her mother; Heart disease in her sister.    ROS:  Please see the history of present illness.   Otherwise, review of systems are positive for none.   All other systems are reviewed and negative.    PHYSICAL EXAM: VS:  BP (!) 138/58   Pulse 61   Ht 5' 2"  (1.575 m)   Wt 154 lb 12.8 oz (70.2 kg)   SpO2 97%   BMI 28.31 kg/m  , BMI Body mass index is 28.31 kg/m. GEN: Well nourished, well developed, in no acute distress HEENT: normal Neck: no JVD, carotid bruits, or masses Cardiac: RRR; no murmurs, rubs, or gallops,no edema  Respiratory:  clear to auscultation bilaterally, normal work of breathing GI: soft, nontender, nondistended, + BS MS: no deformity or atrophy Skin: warm and dry, no rash Neuro:  Strength and sensation are intact Psych: euthymic mood, full affect   EKG:  EKG is ordered today. The ekg ordered today demonstrates NSR rate 61. Occ PAC. Nonspecific ST T changes. I have personally reviewed and interpreted this study.    Recent Labs: 06/01/2021: ALT 32; BUN 16; Creatinine 1.01; Hemoglobin 11.7; Platelet Count 179; Potassium 3.8; Sodium 141 08/24/2021: TSH 0.57   Dated 08/13/21: BUN 21, creatinine 1.19. glucose 134.  Electrolytes normal. CBC and D dimer normal.  Lipid Panel    Component Value Date/Time   CHOL 210 (H) 02/12/2020 0913   CHOL 208 (H) 11/01/2015 0931   TRIG 253.0 (H) 02/12/2020 0913   TRIG 183 (H) 11/01/2015 0931   HDL 44.60 02/12/2020 0913   HDL 40 11/01/2015 0931  CHOLHDL 5 02/12/2020 0913   VLDL 50.6 (H) 02/12/2020 0913   LDLCALC 131 (H) 11/01/2015 0931   LDLDIRECT 134.0 02/12/2020 0913    Dated 11/08/20: cholesterol 206, triglycerides 363, HDL 40, LDL 104.  Dated 08/12/21: creatinine 1.19. otherwise BMET normal  Wt Readings from Last 3 Encounters:  08/30/21 154 lb 12.8 oz (70.2 kg)  08/24/21 152 lb 12.8 oz (69.3 kg)  06/01/21 153 lb (69.4 kg)      Other studies Reviewed: Additional studies/ records that were reviewed today include: none. Review of the above records demonstrates: N/A   ASSESSMENT AND PLAN:  1.  Chest pain and shortness of breath. Symptoms concerning for angina. Multiple cardiac risk factors including HTN, HLD, DM and family history. I have recommended a coronary CTA to further evaluate. If abnormal she will need cardiac cath. If OK then trial of PPI.  2. HLD. Intolerant of statins. If she does have CAD will need to update lipid panel and consider more aggressive lipid therapy with PCSK 9 inhibitor or inclisiran.  3. HTN controlled 4. DM type 2 5. OSA on CPAP    Current medicines are reviewed at length with the patient today.  The patient does not have concerns regarding medicines.  The following changes have been made:  no change  Labs/ tests ordered today include: Coronary CTA  Orders Placed This Encounter  Procedures   CT CORONARY MORPH W/CTA COR W/SCORE W/CA W/CM &/OR WO/CM   EKG 12-Lead          Disposition:   FU with me after above study  Signed, Armella Stogner [place], MD  08/30/2021 10:27 AM    [facility] [address], [place], [place], [address] Phone [phone], Fax [phone]

## 2021-08-30 ENCOUNTER — Encounter: Payer: Self-pay | Admitting: Cardiology

## 2021-08-30 ENCOUNTER — Other Ambulatory Visit: Payer: Self-pay

## 2021-08-30 ENCOUNTER — Ambulatory Visit: Payer: Medicare PPO | Admitting: Cardiology

## 2021-08-30 VITALS — BP 138/58 | HR 61 | Ht 62.0 in | Wt 154.8 lb

## 2021-08-30 DIAGNOSIS — R072 Precordial pain: Secondary | ICD-10-CM | POA: Diagnosis not present

## 2021-08-30 DIAGNOSIS — R079 Chest pain, unspecified: Secondary | ICD-10-CM

## 2021-08-30 DIAGNOSIS — R0602 Shortness of breath: Secondary | ICD-10-CM | POA: Diagnosis not present

## 2021-08-30 DIAGNOSIS — E119 Type 2 diabetes mellitus without complications: Secondary | ICD-10-CM

## 2021-08-30 DIAGNOSIS — I10 Essential (primary) hypertension: Secondary | ICD-10-CM | POA: Diagnosis not present

## 2021-08-30 DIAGNOSIS — E782 Mixed hyperlipidemia: Secondary | ICD-10-CM | POA: Diagnosis not present

## 2021-08-30 MED ORDER — METOPROLOL TARTRATE 50 MG PO TABS: ORAL_TABLET | ORAL | 0 refills | Status: DC

## 2021-08-30 NOTE — Patient Instructions (Addendum)
Medication Instructions:  Continue same medications *If you need a refill on your cardiac medications before your next appointment, please call your pharmacy*   Lab Work: None needed   Testing/Procedures: Coronary CT  will be scheduled after approved by insurance  Follow instructions below   Follow-Up: At [facility], you and your health needs are our priority.  As part of our continuing mission to provide you with exceptional heart care, we have created designated Provider Care Teams.  These Care Teams include your primary Cardiologist (physician) and Advanced Practice Providers (APPs -  Physician Assistants and Nurse Practitioners) who all work together to provide you with the care you need, when you need it.  We recommend signing up for the patient portal called "MyChart".  Sign up information is provided on this After Visit Summary.  MyChart is used to connect with patients for Virtual Visits (Telemedicine).  Patients are able to view lab/test results, encounter notes, upcoming appointments, etc.  Non-urgent messages can be sent to your provider as well.   To learn more about what you can do with MyChart, go to [email or web address].      Your next appointment:      The format for your next appointment: Office    Provider:  Dr.Jordan       Your cardiac CT will be scheduled at one of the below locations:   [facility] [address] [place], [place] [address] [phone]  [place] [address] [place], [place] [address] [phone]  If scheduled at [facility], please arrive at the [facility] main entrance (entrance A) of [facility] 30 minutes prior to test start time. You can use the FREE valet parking offered at the main entrance (encouraged to control the heart rate for the test) Proceed to the [facility] Radiology Department (first floor) to check-in and test  prep.  If scheduled at [facility], please arrive 15 mins early for check-in and test prep.  Please follow these instructions carefully (unless otherwise directed):    On the Night Before the Test: Be sure to Drink plenty of water. Do not consume any caffeinated/decaffeinated beverages or chocolate 12 hours prior to your test. Do not take any antihistamines 12 hours prior to your test.   On the Day of the Test: Drink plenty of water until 1 hour prior to the test. Do not eat any food 4 hours prior to the test. You may take your regular medications prior to the test.  Take metoprolol 50 mg two hours prior to test. HOLD Triamterene/Hydrochlorothiazide morning of the test. FEMALES- please wear underwire-free bra if available, avoid dresses & tight clothing         After the Test: Drink plenty of water. After receiving IV contrast, you may experience a mild flushed feeling. This is normal. On occasion, you may experience a mild rash up to 24 hours after the test. This is not dangerous. If this occurs, you can take Benadryl 25 mg and increase your fluid intake. If you experience trouble breathing, this can be serious. If it is severe call 911 IMMEDIATELY. If it is mild, please call our office. If you take any of these medications: Glipizide/Metformin, Avandament, Glucavance, please do not take 48 hours after completing test unless otherwise instructed.  Please allow 2-4 weeks for scheduling of routine cardiac CTs. Some insurance companies require a pre-authorization which may delay scheduling of this test.  For non-scheduling related questions, please contact the cardiac imaging nurse navigator should you have any questions/concerns: Marchia Bond, Cardiac Imaging Nurse Navigator Gordy Clement, Cardiac Imaging Nurse Navigator [facility] Direct Office Dial: [phone]   For scheduling needs, including cancellations and  rescheduling, please call [place], [phone].

## 2021-09-08 ENCOUNTER — Telehealth (HOSPITAL_COMMUNITY): Payer: Self-pay | Admitting: Emergency Medicine

## 2021-09-08 NOTE — Telephone Encounter (Signed)
Reaching out to patient to offer assistance regarding upcoming cardiac imaging study; pt verbalizes understanding of appt date/time, parking situation and where to check in, pre-test NPO status and medications ordered, and verified current allergies; name and call back number provided for further questions should they arise Marchia Bond RN Navigator Cardiac Imaging [facility] [phone] office [phone] cell  Holding diuretic day of test Holding metformin 48 hr post test Taking metoprolol 2 hr prior to test Arrival 9am Denies iv issues

## 2021-09-12 ENCOUNTER — Ambulatory Visit (HOSPITAL_COMMUNITY)
Admission: RE | Admit: 2021-09-12 | Discharge: 2021-09-12 | Disposition: A | Discharge status: Home / Self Care | Payer: Medicare PPO | Source: Ambulatory Visit | Attending: Internal Medicine | Admitting: Internal Medicine

## 2021-09-12 ENCOUNTER — Encounter (HOSPITAL_COMMUNITY): Payer: Self-pay

## 2021-09-12 ENCOUNTER — Other Ambulatory Visit: Payer: Self-pay

## 2021-09-12 ENCOUNTER — Other Ambulatory Visit: Payer: Self-pay | Admitting: Internal Medicine

## 2021-09-12 ENCOUNTER — Ambulatory Visit (HOSPITAL_COMMUNITY)
Admission: RE | Admit: 2021-09-12 | Discharge: 2021-09-12 | Disposition: A | Discharge status: Home / Self Care | Payer: Medicare PPO | Source: Ambulatory Visit | Attending: Cardiology | Admitting: Cardiology

## 2021-09-12 DIAGNOSIS — I2510 Atherosclerotic heart disease of native coronary artery without angina pectoris: Secondary | ICD-10-CM | POA: Diagnosis not present

## 2021-09-12 DIAGNOSIS — R931 Abnormal findings on diagnostic imaging of heart and coronary circulation: Secondary | ICD-10-CM | POA: Insufficient documentation

## 2021-09-12 DIAGNOSIS — R072 Precordial pain: Secondary | ICD-10-CM | POA: Insufficient documentation

## 2021-09-12 DIAGNOSIS — E782 Mixed hyperlipidemia: Secondary | ICD-10-CM | POA: Insufficient documentation

## 2021-09-12 DIAGNOSIS — E119 Type 2 diabetes mellitus without complications: Secondary | ICD-10-CM | POA: Insufficient documentation

## 2021-09-12 DIAGNOSIS — I10 Essential (primary) hypertension: Secondary | ICD-10-CM | POA: Diagnosis not present

## 2021-09-12 DIAGNOSIS — R0602 Shortness of breath: Secondary | ICD-10-CM | POA: Insufficient documentation

## 2021-09-12 DIAGNOSIS — R079 Chest pain, unspecified: Secondary | ICD-10-CM | POA: Insufficient documentation

## 2021-09-12 LAB — GLUCOSE, CAPILLARY: Glucose-Capillary: 150 mg/dL — ABNORMAL HIGH (ref 70–99)

## 2021-09-12 MED ORDER — NITROGLYCERIN 0.4 MG SL SUBL
0.8000 mg | SUBLINGUAL_TABLET | Freq: Once | SUBLINGUAL | Status: AC
  Administered 2021-09-12: 10:00:00 0.8 mg via SUBLINGUAL

## 2021-09-12 MED ORDER — NITROGLYCERIN 0.4 MG SL SUBL
SUBLINGUAL_TABLET | SUBLINGUAL | Status: AC
  Filled 2021-09-12: qty 2

## 2021-09-12 MED ORDER — IOHEXOL 350 MG/ML SOLN
95.0000 mL | Freq: Once | INTRAVENOUS | Status: AC | PRN
  Administered 2021-09-12: 10:00:00 95 mL via INTRAVENOUS

## 2021-09-12 NOTE — Progress Notes (Signed)
Please send CT for FFR - Dr. Lemmie Evens

## 2021-10-07 NOTE — Progress Notes (Signed)
Cardiology Office Note   Date:  10/21/2021   ID:  Robin [name], DOB 06/27/1941, MRN [id number]  PCP:  Kathyrn Lass, MD  Cardiologist:   Ruwayda Curet [place], MD   Chief Complaint  Patient presents with   Chest Pain      History of Present Illness: Robin [name] is a 81 y.o. female who is seen for follow up of chest pain and DOE. She has a history of OSA, DM, HLD, and HTN.   When seen in Dec she reported  pain in the chest and SOB. Describes the pain as a tightness in the central chest without radiation. Some SOB. Comes and goes. Thinks may be related to meals. No prior cardiac evaluation. Does have a strong family history of CAD. Intolerant to statins due to myalgias. She is still working 4 hours/day at [employer]. Lives alone.  We performed coronary CTA which demonstrated nonobstructive CAD. Modest ostial RCA disease with normal FFR. She notes that her chest tightness has improved on omeprazole.    Past Medical History:  Diagnosis Date   Arthritis    Colon cancer ([place])    colon ca dx 0737 and 1062   Complication of anesthesia    problems waking up-patient thinks she was given too much medication   Gestational diabetes    Headache    headaches all the time   Hyperlipidemia    Hypertension    Sleep apnea    uses CPAP   Urticaria     Past Surgical History:  Procedure Laterality Date   ABDOMINAL HYSTERECTOMY     APPENDECTOMY     BACK SURGERY  2009   CARPAL TUNNEL RELEASE     COLON SURGERY     COLONOSCOPY WITH PROPOFOL N/A 08/10/2015   Procedure: COLONOSCOPY WITH PROPOFOL;  Surgeon: Garlan Fair, MD;  Location: [facility];  Service: Endoscopy;  Laterality: N/A;   DILATION AND CURETTAGE OF UTERUS     had before had hysterectomy   ESOPHAGOGASTRODUODENOSCOPY (EGD) WITH PROPOFOL N/A 08/10/2015   Procedure: ESOPHAGOGASTRODUODENOSCOPY (EGD) WITH PROPOFOL;  Surgeon: Garlan Fair, MD;  Location: [facility];  Service: Endoscopy;  Laterality: N/A;   EYE SURGERY      bilateral cataract surgery    ROTATOR CUFF REPAIR       Current Outpatient Medications  Medication Sig Dispense Refill   Accu-Chek Softclix Lancets lancets Use as instructed to check sugar once daily 100 each 0   amLODipine (NORVASC) 10 MG tablet Take 10 mg by mouth Daily.     Ascorbic Acid (VITAMIN C) 500 MG CAPS daily.     aspirin 81 MG tablet Take 81 mg by mouth daily.     beta carotene w/minerals (OCUVITE) tablet Take 1 tablet by mouth daily.     Blood Glucose Monitoring Suppl (ACCU-CHEK GUIDE ME) w/Device KIT Use as instructed to check sugar once daily 1 kit 0   budesonide (ENTOCORT EC) 3 MG 24 hr capsule Take by mouth daily as needed.     Cholecalciferol (VITAMIN D-3 PO) Take 2,000 Units by mouth daily.     EPINEPHrine 0.3 mg/0.3 mL IJ SOAJ injection See admin instructions.     ezetimibe (ZETIA) 10 MG tablet daily.     famotidine (PEPCID) 20 MG tablet Take 20 mg by mouth 2 (two) times daily as needed.     fexofenadine (ALLEGRA) 180 MG tablet Take 1 tablet (180 mg total) by mouth 2 (two) times daily. 60 tablet 3  Fluocinolone Acetonide Body 0.01 % OIL APPLY OIL TOPICALLY TO AFFECTED AREA TWICE DAILY AS NEEDED (APPLY SPARINGLY)     FLUoxetine (PROZAC) 20 MG capsule Take 20 mg by mouth Daily.     fluticasone (FLONASE) 50 MCG/ACT nasal spray Place 1 spray into both nostrils daily.     Glucosamine HCl 1500 MG TABS Take 1,500 mg by mouth daily.      glucose blood (ACCU-CHEK GUIDE) test strip Use as instructed to check sugar once daily 100 each 0   levothyroxine (SYNTHROID, LEVOTHROID) 112 MCG tablet Take 112 mcg by mouth daily before breakfast.      Magnesium 400 MG TABS daily.     metFORMIN (GLUCOPHAGE-XR) 500 MG 24 hr tablet Take by mouth 2 tablets am 1 tablet pm 270 tablet 3   Multiple Vitamin (MULTIVITAMIN) capsule Take 1 capsule by mouth daily.     polyvinyl alcohol (LIQUIFILM TEARS) 1.4 % ophthalmic solution Place 1 drop into both eyes as needed for dry eyes.     Probiotic Product  (PROBIOTIC DAILY) CAPS Take 1 capsule by mouth every morning.      triamterene-hydrochlorothiazide (DYAZIDE) 37.5-25 MG per capsule Take 1 capsule by mouth Daily.      vitamin B-12 (CYANOCOBALAMIN) 100 MCG tablet daily.     Zinc 30 MG CAPS daily.     No current facility-administered medications for this visit.    Allergies:   Ace inhibitors and Statins    Social History:  The patient  reports that she has never smoked. She has never used smokeless tobacco. She reports that she does not drink alcohol and does not use drugs.   Family History:  The patient's family history includes Aneurysm (age of onset: 67) in her brother; Heart attack (age of onset: 45) in her brother; Heart attack (age of onset: 60) in her brother; Heart attack (age of onset: 4) in her mother; Heart disease in her sister.    ROS:  Please see the history of present illness.   Otherwise, review of systems are positive for none.   All other systems are reviewed and negative.    PHYSICAL EXAM: VS:  BP (!) 142/60 (BP Location: Left Arm, Patient Position: Sitting, Cuff Size: Normal)    Pulse 74    Resp 20    Ht 5' 2" (1.575 m)    Wt 155 lb (70.3 kg)    SpO2 97%    BMI 28.35 kg/m  , BMI Body mass index is 28.35 kg/m. GEN: Well nourished, well developed, in no acute distress HEENT: normal Neck: no JVD, carotid bruits, or masses Cardiac: RRR; no murmurs, rubs, or gallops,no edema  Respiratory:  clear to auscultation bilaterally, normal work of breathing GI: soft, nontender, nondistended, + BS MS: no deformity or atrophy Skin: warm and dry, no rash Neuro:  Strength and sensation are intact Psych: euthymic mood, full affect   EKG:  EKG is not ordered today.    Recent Labs: 06/01/2021: ALT 32; BUN 16; Creatinine 1.01; Hemoglobin 11.7; Platelet Count 179; Potassium 3.8; Sodium 141 08/24/2021: TSH 0.57   Dated 08/13/21: BUN 21, creatinine 1.19. glucose 134. Electrolytes normal. CBC and D dimer normal.  Lipid Panel     Component Value Date/Time   CHOL 210 (H) 02/12/2020 0913   CHOL 208 (H) 11/01/2015 0931   TRIG 253.0 (H) 02/12/2020 0913   TRIG 183 (H) 11/01/2015 0931   HDL 44.60 02/12/2020 0913   HDL 40 11/01/2015 0931   CHOLHDL 5 02/12/2020 0913  VLDL 50.6 (H) 02/12/2020 0913   LDLCALC 131 (H) 11/01/2015 0931   LDLDIRECT 134.0 02/12/2020 0913    Dated 11/08/20: cholesterol 206, triglycerides 363, HDL 40, LDL 104.  Dated 08/12/21: creatinine 1.19. otherwise BMET normal  Wt Readings from Last 3 Encounters:  10/21/21 155 lb (70.3 kg)  08/30/21 154 lb 12.8 oz (70.2 kg)  08/24/21 152 lb 12.8 oz (69.3 kg)      Other studies Reviewed: Additional studies/ records that were reviewed today include:   ADDENDUM REPORT: 09/12/2021 11:19   HISTORY: 81 yo female with nonspecific chest pain   EXAM: Cardiac/Coronary CTA   TECHNIQUE: The patient was scanned on a [employer].   PROTOCOL: A 120 kV prospective scan was triggered in the descending thoracic aorta at 111 HU's. Axial non-contrast 3 mm slices were carried out through the heart. The data set was analyzed on a dedicated work station and scored using the [place]. Gantry rotation speed was 250 msecs and collimation was .6 mm. Beta blockade and 0.8 mg of sl NTG was given. The 3D data set was reconstructed in 5% intervals of the 35-75 % of the R-R cycle. Diastolic phases were analyzed on a dedicated work station using MPR, MIP and VRT modes. The patient received 45m OMNIPAQUE IOHEXOL 350 MG/ML SOLN of contrast.   FINDINGS: Quality: Excellent, HR 42   Coronary calcium score: The patient's coronary artery calcium score is 342, which places the patient in the 71st percentile.   Coronary arteries: Normal coronary origins.  Right dominance.   Right Coronary Artery: Dominant. Moderate 50-69% mixed circumferential ostial stenosis (CADRADS3). No distal disease.   Left Main Coronary Artery: Minimal mixed 1-24% proximal  stenosis (CADRADS1). Bifurcates into the LAD and LCx arteries.   Left Anterior Descending Coronary Artery: Anterior vessel which extends around the apex. Minimal mixed 1-24% proximal stenosis (CADRADS1). Several small diagonal branches without disease.   Left Circumflex Artery: AV groove LCx with large OM1 branch and diminutive AV groove proper vessel. Minimal to mild proximal 24-49% stenosis (CADRADS1) of the LCx. There is no disease of the OM1 branch.   Aorta: Normal size, 30 mm at the mid ascending aorta (level of the PA bifurcation) measured double oblique. Aortic atherosclerosis. No dissection.   Aortic Valve: Trileaflet. Annular calcification.   Other findings:   Normal pulmonary vein drainage into the left atrium.   Normal left atrial appendage without a thrombus.   Normal size of the pulmonary artery.   Mitral annular calcification.   IMPRESSION: 1. Moderate ostial mixed stenosis of the RCA, otherwise mild non-obstructive CAD, CADRADS = 3. Will send for FFR evaluation of the RCA.   2. Coronary calcium score of 342. This was 71st percentile for age and sex matched control.   3. Normal coronary origin with right dominance.   4. Aortic atherosclerosis, including aortic annular and mitral annular calcification.     Electronically Signed   By: KPixie CasinoM.D.   On: 09/12/2021 11:19   CT FFR ANALYSIS   CLINICAL DATA:  abnormal CT   FINDINGS: FFRct analysis was performed on the original cardiac CT angiogram dataset. Diagrammatic representation of the FFRct analysis is provided in a separate PDF document in PACS. This dictation was created using the PDF document and an interactive 3D model of the results. 3D model is not available in the EMR/PACS. Normal FFR range is >0.80.   1. Left Main:  No significant stenosis. FFR = 0.99   2. LAD: No significant  stenosis. Proximal FFR = 0.96, Mid FFR = 0.92, Distal FFR = 0.85 3. LCX: No significant stenosis.  Proximal FFR = 0.94, Distal FFR = 0.90 4. RCA: No significant stenosis. Ostial FFR = 1.00, Proximal to mid FFR = 0.86, Distal FFR = 0.79   IMPRESSION: 1.  CT FFR analysis did not show significant stenosis.   2. Roadmap suggested ostial RCA to be >70% stenosed, however, I read more moderate 50-69%.   3. Aggressive medical therapy is recommended. Clinical correlation with symptoms is advised.     Electronically Signed   By: Pixie Casino M.D.   On: 09/12/2021 14:12   ASSESSMENT AND PLAN:  1.  Chest pain and shortness of breath. Multiple cardiac risk factors including HTN, HLD, DM and family history. Coronary CTA showed nonobstructive CAD. Her symptoms are better on omeprazole. At this point just needs to focus on risk factor modification.  2. HLD. Intolerant of statins. Goal LDL < 70. Now on Zetia. Will follow up with PCP to update lipid panel and consider more aggressive lipid therapy with PCSK 9 inhibitor or inclisiran if LDL still not at goal. 3. HTN controlled 4. DM type 2 5. OSA on CPAP    Will follow up PRN  Signed, Heyli Min [place], MD  10/21/2021 10:39 AM    [facility] [address], [place], [place], [address] Phone [phone], Fax [phone]

## 2021-10-21 ENCOUNTER — Ambulatory Visit: Payer: Medicare PPO | Admitting: Cardiology

## 2021-10-21 ENCOUNTER — Other Ambulatory Visit: Payer: Self-pay

## 2021-10-21 ENCOUNTER — Encounter: Payer: Self-pay | Admitting: Cardiology

## 2021-10-21 VITALS — BP 142/60 | HR 74 | Resp 20 | Ht 62.0 in | Wt 155.0 lb

## 2021-10-21 DIAGNOSIS — E782 Mixed hyperlipidemia: Secondary | ICD-10-CM | POA: Diagnosis not present

## 2021-10-21 DIAGNOSIS — E119 Type 2 diabetes mellitus without complications: Secondary | ICD-10-CM

## 2021-10-21 DIAGNOSIS — R079 Chest pain, unspecified: Secondary | ICD-10-CM | POA: Diagnosis not present

## 2021-10-21 DIAGNOSIS — I10 Essential (primary) hypertension: Secondary | ICD-10-CM | POA: Diagnosis not present

## 2021-10-24 DIAGNOSIS — E663 Overweight: Secondary | ICD-10-CM | POA: Diagnosis not present

## 2021-10-24 DIAGNOSIS — E1165 Type 2 diabetes mellitus with hyperglycemia: Secondary | ICD-10-CM | POA: Diagnosis not present

## 2021-10-24 DIAGNOSIS — G4733 Obstructive sleep apnea (adult) (pediatric): Secondary | ICD-10-CM | POA: Diagnosis not present

## 2021-10-24 DIAGNOSIS — F419 Anxiety disorder, unspecified: Secondary | ICD-10-CM | POA: Diagnosis not present

## 2021-10-24 DIAGNOSIS — E785 Hyperlipidemia, unspecified: Secondary | ICD-10-CM | POA: Diagnosis not present

## 2021-10-24 DIAGNOSIS — F325 Major depressive disorder, single episode, in full remission: Secondary | ICD-10-CM | POA: Diagnosis not present

## 2021-10-24 DIAGNOSIS — E11620 Type 2 diabetes mellitus with diabetic dermatitis: Secondary | ICD-10-CM | POA: Diagnosis not present

## 2021-10-24 DIAGNOSIS — E039 Hypothyroidism, unspecified: Secondary | ICD-10-CM | POA: Diagnosis not present

## 2021-10-24 DIAGNOSIS — I10 Essential (primary) hypertension: Secondary | ICD-10-CM | POA: Diagnosis not present

## 2021-11-21 DIAGNOSIS — E039 Hypothyroidism, unspecified: Secondary | ICD-10-CM | POA: Diagnosis not present

## 2021-11-21 DIAGNOSIS — I2510 Atherosclerotic heart disease of native coronary artery without angina pectoris: Secondary | ICD-10-CM | POA: Diagnosis not present

## 2021-11-21 DIAGNOSIS — Z7984 Long term (current) use of oral hypoglycemic drugs: Secondary | ICD-10-CM | POA: Diagnosis not present

## 2021-11-21 DIAGNOSIS — Z0000 Encounter for general adult medical examination without abnormal findings: Secondary | ICD-10-CM | POA: Diagnosis not present

## 2021-11-21 DIAGNOSIS — Z1389 Encounter for screening for other disorder: Secondary | ICD-10-CM | POA: Diagnosis not present

## 2021-11-21 DIAGNOSIS — E782 Mixed hyperlipidemia: Secondary | ICD-10-CM | POA: Diagnosis not present

## 2021-11-21 DIAGNOSIS — F339 Major depressive disorder, recurrent, unspecified: Secondary | ICD-10-CM | POA: Diagnosis not present

## 2021-11-21 DIAGNOSIS — E1122 Type 2 diabetes mellitus with diabetic chronic kidney disease: Secondary | ICD-10-CM | POA: Diagnosis not present

## 2021-11-21 DIAGNOSIS — I10 Essential (primary) hypertension: Secondary | ICD-10-CM | POA: Diagnosis not present

## 2021-11-21 DIAGNOSIS — I700 Atherosclerosis of aorta: Secondary | ICD-10-CM | POA: Diagnosis not present

## 2021-11-21 DIAGNOSIS — G720 Drug-induced myopathy: Secondary | ICD-10-CM | POA: Diagnosis not present

## 2021-11-21 DIAGNOSIS — N1831 Chronic kidney disease, stage 3a: Secondary | ICD-10-CM | POA: Diagnosis not present

## 2021-11-29 ENCOUNTER — Inpatient Hospital Stay: Payer: Medicare PPO | Admitting: Hematology & Oncology

## 2021-11-29 ENCOUNTER — Other Ambulatory Visit: Payer: Self-pay

## 2021-11-29 ENCOUNTER — Encounter: Payer: Self-pay | Admitting: Hematology & Oncology

## 2021-11-29 ENCOUNTER — Inpatient Hospital Stay: Payer: Medicare PPO | Attending: Hematology & Oncology

## 2021-11-29 VITALS — BP 149/58 | HR 64 | Temp 98.3°F | Resp 16 | Wt 152.0 lb

## 2021-11-29 DIAGNOSIS — Z85038 Personal history of other malignant neoplasm of large intestine: Secondary | ICD-10-CM | POA: Diagnosis not present

## 2021-11-29 DIAGNOSIS — C183 Malignant neoplasm of hepatic flexure: Secondary | ICD-10-CM

## 2021-11-29 DIAGNOSIS — C184 Malignant neoplasm of transverse colon: Secondary | ICD-10-CM

## 2021-11-29 LAB — CMP (CANCER CENTER ONLY)
ALT: 26 U/L (ref 0–44)
AST: 20 U/L (ref 15–41)
Albumin: 4.1 g/dL (ref 3.5–5.0)
Alkaline Phosphatase: 41 U/L (ref 38–126)
Anion gap: 8 (ref 5–15)
BUN: 15 mg/dL (ref 8–23)
CO2: 30 mmol/L (ref 22–32)
Calcium: 9.8 mg/dL (ref 8.9–10.3)
Chloride: 100 mmol/L (ref 98–111)
Creatinine: 1.01 mg/dL — ABNORMAL HIGH (ref 0.44–1.00)
GFR, Estimated: 56 mL/min — ABNORMAL LOW (ref >60)
Glucose, Bld: 151 mg/dL — ABNORMAL HIGH (ref 70–99)
Potassium: 4.2 mmol/L (ref 3.5–5.1)
Sodium: 138 mmol/L (ref 135–145)
Total Bilirubin: 0.7 mg/dL (ref 0.3–1.2)
Total Protein: 6.5 g/dL (ref 6.5–8.1)

## 2021-11-29 LAB — CBC WITH DIFFERENTIAL (CANCER CENTER ONLY)
Abs Immature Granulocytes: 0.02 10*3/uL (ref 0.00–0.07)
Basophils Absolute: 0 10*3/uL (ref 0.0–0.1)
Basophils Relative: 1 %
Eosinophils Absolute: 0.4 10*3/uL (ref 0.0–0.5)
Eosinophils Relative: 6 %
HCT: 33.1 % — ABNORMAL LOW (ref 36.0–46.0)
Hemoglobin: 11.3 g/dL — ABNORMAL LOW (ref 12.0–15.0)
Immature Granulocytes: 0 %
Lymphocytes Relative: 27 %
Lymphs Abs: 1.8 10*3/uL (ref 0.7–4.0)
MCH: 30.4 pg (ref 26.0–34.0)
MCHC: 34.1 g/dL (ref 30.0–36.0)
MCV: 89 fL (ref 80.0–100.0)
Monocytes Absolute: 0.6 10*3/uL (ref 0.1–1.0)
Monocytes Relative: 8 %
Neutro Abs: 3.8 10*3/uL (ref 1.7–7.7)
Neutrophils Relative %: 58 %
Platelet Count: 170 10*3/uL (ref 150–400)
RBC: 3.72 MIL/uL — ABNORMAL LOW (ref 3.87–5.11)
RDW: 12.3 % (ref 11.5–15.5)
WBC Count: 6.6 10*3/uL (ref 4.0–10.5)
nRBC: 0 % (ref 0.0–0.2)

## 2021-11-29 LAB — CEA (IN HOUSE-CHCC): CEA (CHCC-In House): 2.03 ng/mL (ref 0.00–5.00)

## 2021-11-29 NOTE — Progress Notes (Signed)
Hematology and Oncology Follow Up Visit  ADELLA MANOLIS [id number] [birth year] 81 y.o. 11/29/2021   Principle Diagnosis:  History of locally recurrent adenocarcinoma of the colon  Current Therapy:   Observation    Interim History:  Ms. Dobler is here today for follow-up.  We see her every 6 months.  She has had no problems since we last saw her.  She did have COVID back in January.  Thankfully, she was not admitted.  She did well over the holiday season.  She was with family.  Apparently, her family doctor is a bit worried about her anemia.  She has been anemic for a couple years.  I suspect the anemia is 5 for her diabetes.  She does have a lower retake count.  We have checked iron studies in the past.  They have been okay.  She has had no problems with bleeding.  She feels well.  She is planning on going on a cruise in May.  Will be to the [place].  She has had no problems with nausea or vomiting.  She is not a vegetarian.  There has been no change in bowel or bladder habits.  She has had no rashes.  She has had no headache.  She is due for a mammogram this month.  Overall, I would say her performance status is ECOG 1.     Medications:  Allergies as of 11/29/2021       Reactions   Ace Inhibitors Other (See Comments)   cough   Atorvastatin Other (See Comments)   Beef (diagnostic) Other (See Comments)   Celecoxib Other (See Comments)   Rosuvastatin Other (See Comments)   Statins    Aches in her bone crestor and lipitor        Medication List        Accurate as of November 29, 2021 10:22 AM. If you have any questions, ask your nurse or doctor.          Accu-Chek Guide Me w/Device Kit Use as instructed to check sugar once daily   Accu-Chek Guide test strip Generic drug: glucose blood Use as instructed to check sugar once daily   Accu-Chek Softclix Lancets lancets Use as instructed to check sugar once daily   amLODipine 10 MG tablet Commonly known as:  NORVASC Take 10 mg by mouth Daily.   aspirin 81 MG tablet Take 81 mg by mouth daily.   beta carotene w/minerals tablet Take 1 tablet by mouth daily.   budesonide 3 MG 24 hr capsule Commonly known as: ENTOCORT EC Take by mouth daily as needed.   EPINEPHrine 0.3 mg/0.3 mL Soaj injection Commonly known as: EPI-PEN See admin instructions.   ezetimibe 10 MG tablet Commonly known as: ZETIA daily.   famotidine 20 MG tablet Commonly known as: PEPCID Take 20 mg by mouth 2 (two) times daily as needed.   famotidine 40 MG tablet Commonly known as: PEPCID 1 tablet at bedtime.   fexofenadine 180 MG tablet Commonly known as: ALLEGRA Take 1 tablet (180 mg total) by mouth 2 (two) times daily.   Fluocinolone Acetonide Body 0.01 % Oil APPLY OIL TOPICALLY TO AFFECTED AREA TWICE DAILY AS NEEDED (APPLY SPARINGLY)   FLUoxetine 20 MG capsule Commonly known as: PROZAC Take 20 mg by mouth Daily.   fluticasone 50 MCG/ACT nasal spray Commonly known as: FLONASE Place 1 spray into both nostrils daily.   Glucosamine HCl 1500 MG Tabs Take 1,500 mg by mouth daily.   levothyroxine 112  MCG tablet Commonly known as: SYNTHROID Take 112 mcg by mouth daily before breakfast.   Magnesium 400 MG Tabs daily.   metFORMIN 500 MG 24 hr tablet Commonly known as: GLUCOPHAGE-XR Take by mouth 2 tablets am 1 tablet pm   multivitamin capsule Take 1 capsule by mouth daily.   polyvinyl alcohol 1.4 % ophthalmic solution Commonly known as: LIQUIFILM TEARS Place 1 drop into both eyes as needed for dry eyes.   Probiotic Daily Caps Take 1 capsule by mouth every morning.   triamterene-hydrochlorothiazide 37.5-25 MG capsule Commonly known as: DYAZIDE Take 1 capsule by mouth Daily.   vitamin B-12 100 MCG tablet Commonly known as: CYANOCOBALAMIN daily.   Vitamin C 500 MG Caps daily.   VITAMIN D-3 PO Take 2,000 Units by mouth daily.   Zinc 30 MG Caps daily.        Allergies:  Allergies   Allergen Reactions   Ace Inhibitors Other (See Comments)    cough   Atorvastatin Other (See Comments)   Beef (Diagnostic) Other (See Comments)   Celecoxib Other (See Comments)   Rosuvastatin Other (See Comments)   Statins     Aches in her bone crestor and lipitor    Past Medical History, Surgical history, Social history, and Family History were reviewed and updated.  Review of Systems: Review of Systems  Constitutional: Negative.   HENT: Negative.    Eyes: Negative.   Respiratory: Negative.    Cardiovascular: Negative.   Gastrointestinal: Negative.   Genitourinary: Negative.   Musculoskeletal: Negative.   Skin: Negative.   Endo/Heme/Allergies: Negative.   Psychiatric/Behavioral: Negative.     Physical Exam:  weight is 152 lb (68.9 kg). Her oral temperature is 98.3 F (36.8 C). Her blood pressure is 149/58 (abnormal) and her pulse is 64. Her respiration is 16 and oxygen saturation is 98%.   Wt Readings from Last 3 Encounters:  11/29/21 152 lb (68.9 kg)  10/21/21 155 lb (70.3 kg)  08/30/21 154 lb 12.8 oz (70.2 kg)    Physical Exam Vitals reviewed.  HENT:     Head: Normocephalic and atraumatic.  Eyes:     Pupils: Pupils are equal, round, and reactive to light.  Cardiovascular:     Rate and Rhythm: Normal rate and regular rhythm.     Heart sounds: Normal heart sounds.  Pulmonary:     Effort: Pulmonary effort is normal.     Breath sounds: Normal breath sounds.  Abdominal:     General: Bowel sounds are normal.     Palpations: Abdomen is soft.  Musculoskeletal:        General: No tenderness or deformity. Normal range of motion.     Cervical back: Normal range of motion.  Lymphadenopathy:     Cervical: No cervical adenopathy.  Skin:    General: Skin is warm and dry.     Findings: No erythema or rash.  Neurological:     Mental Status: She is alert and oriented to person, place, and time.  Psychiatric:        Behavior: Behavior normal.        Thought Content:  Thought content normal.        Judgment: Judgment normal.     Lab Results  Component Value Date   WBC 6.6 11/29/2021   HGB 11.3 (L) 11/29/2021   HCT 33.1 (L) 11/29/2021   MCV 89.0 11/29/2021   PLT 170 11/29/2021   Lab Results  Component Value Date   FERRITIN 45 06/01/2021  IRON 102 06/01/2021   TIBC 368 06/01/2021   UIBC 266 06/01/2021   IRONPCTSAT 28 06/01/2021   Lab Results  Component Value Date   RETICCTPCT 1.9 06/01/2021   RBC 3.72 (L) 11/29/2021   RETICCTABS 48.7 12/17/2008   No results found for: KPAFRELGTCHN, LAMBDASER, KAPLAMBRATIO No results found for: IGGSERUM, IGA, IGMSERUM No results found for: Odetta Pink, SPEI   Chemistry      Component Value Date/Time   NA 138 11/29/2021 0945   NA 142 03/22/2018 1059   NA 139 05/21/2017 0838   K 4.2 11/29/2021 0945   K 4.3 05/21/2017 0838   CL 100 11/29/2021 0945   CL 102 05/18/2016 1505   CL 100 11/01/2015 0932   CO2 30 11/29/2021 0945   CO2 28 05/21/2017 0838   BUN 15 11/29/2021 0945   BUN 13 03/22/2018 1059   BUN 20.8 05/21/2017 0838   CREATININE 1.01 (H) 11/29/2021 0945   CREATININE 1.0 05/21/2017 0838      Component Value Date/Time   CALCIUM 9.8 11/29/2021 0945   CALCIUM 10.5 (H) 05/21/2017 0838   ALKPHOS 41 11/29/2021 0945   ALKPHOS 49 05/21/2017 0838   AST 20 11/29/2021 0945   AST 22 05/21/2017 0838   ALT 26 11/29/2021 0945   ALT 33 05/21/2017 0838   BILITOT 0.7 11/29/2021 0945   BILITOT 0.57 05/21/2017 0838      Impression and Plan: Ms. Knapper is a very pleasant 81 yo caucasian female with history of locally recurrent adenocarcinoma of the colon with resection in 2000. She received adjuvant FOLFOX.  I see no evidence of recurrent disease.  Again, I am not really worried about the anemia.  She has had this for about 4 years.  Back i August 2019, she did have a normal hemoglobin.  Again, I suspect that the anemia is based on her renal  function and also from her diabetes.  I am sure that her erythropoietin levels can be low.  For right now, she is not symptomatic.  I looked at her blood smear.  I do not see anything under the blood smear that looks suspicious.  At 81 years old, she could always have an element of myelodysplasia but again I do not think we have to put her through a bone marrow to find this out.  I will still follow her up in 6 months.  If there is any problems between now and then, I will be more than happy to see her back.  [name], MD 3/7/202310:22 AM

## 2021-11-30 ENCOUNTER — Inpatient Hospital Stay: Payer: Medicare PPO

## 2021-11-30 ENCOUNTER — Ambulatory Visit: Payer: Medicare PPO | Admitting: Hematology & Oncology

## 2021-12-05 DIAGNOSIS — H1045 Other chronic allergic conjunctivitis: Secondary | ICD-10-CM | POA: Diagnosis not present

## 2021-12-05 DIAGNOSIS — H04123 Dry eye syndrome of bilateral lacrimal glands: Secondary | ICD-10-CM | POA: Diagnosis not present

## 2021-12-05 DIAGNOSIS — E119 Type 2 diabetes mellitus without complications: Secondary | ICD-10-CM | POA: Diagnosis not present

## 2021-12-05 DIAGNOSIS — H40013 Open angle with borderline findings, low risk, bilateral: Secondary | ICD-10-CM | POA: Diagnosis not present

## 2021-12-05 LAB — HM DIABETES EYE EXAM

## 2021-12-22 ENCOUNTER — Other Ambulatory Visit: Payer: Self-pay | Admitting: Family Medicine

## 2021-12-22 ENCOUNTER — Ambulatory Visit: Payer: Medicare PPO | Admitting: Internal Medicine

## 2021-12-22 ENCOUNTER — Encounter: Payer: Self-pay | Admitting: Internal Medicine

## 2021-12-22 VITALS — BP 120/62 | HR 67 | Ht 62.0 in | Wt 150.8 lb

## 2021-12-22 DIAGNOSIS — E1121 Type 2 diabetes mellitus with diabetic nephropathy: Secondary | ICD-10-CM | POA: Diagnosis not present

## 2021-12-22 DIAGNOSIS — E1169 Type 2 diabetes mellitus with other specified complication: Secondary | ICD-10-CM

## 2021-12-22 DIAGNOSIS — E063 Autoimmune thyroiditis: Secondary | ICD-10-CM

## 2021-12-22 DIAGNOSIS — E785 Hyperlipidemia, unspecified: Secondary | ICD-10-CM

## 2021-12-22 DIAGNOSIS — E038 Other specified hypothyroidism: Secondary | ICD-10-CM

## 2021-12-22 DIAGNOSIS — Z1231 Encounter for screening mammogram for malignant neoplasm of breast: Secondary | ICD-10-CM

## 2021-12-22 LAB — POCT GLYCOSYLATED HEMOGLOBIN (HGB A1C): Hemoglobin A1C: 6.8 % — AB (ref 4.0–5.6)

## 2021-12-22 NOTE — Patient Instructions (Signed)
Please continue Metformin ER 1000 mg in am and 500 mg with dinner. ? ?Check sugars 1x a day, rotating check times and write them down. ? ?Continue Fish oil 1000 mg 2x a day. ? ?Continue Levothyroxine 112 mcg daily. ? ?Take the thyroid hormone every day, with water, at least 30 minutes before breakfast, separated by at least 4 hours from: ?- acid reflux medications ?- calcium ?- iron ?- multivitamins ? ?Please return in 4 months with your sugar log.  ?

## 2021-12-22 NOTE — Progress Notes (Signed)
Patient ID: Robin [name], female   DOB: 12-10-1940, 81 y.o.   MRN: [id number] ? ?This visit occurred during the SARS-CoV-2 public health emergency.  Safety protocols were in place, including screening questions prior to the visit, additional usage of staff PPE, and extensive cleaning of exam room while observing appropriate contact time as indicated for disinfecting solutions.  ? ?HPI: ?Robin [name] is a 81 y.o.-year-old female, returning for f/u for DM2, initially GDM, then DM2 dx in 05/2015, non-insulin-dependent, controlled, with complications (mild CKD, diabetic retinopathy). Last visit 4 months ago. ? ?Interim history: ?No increased urination, blurry vision, nausea, chest pain. ? ?Reviewed HbA1c levels: ?Lab Results  ?Component Value Date  ? HGBA1C 7.0 (A) 08/24/2021  ? HGBA1C 7.1 (A) 02/15/2021  ? HGBA1C 6.0 (A) 02/12/2020  ?07/20/2020: HbA1c 6.6% ? ?Pt is on a regimen of: ?- Metformin ER 1000 mg with dinner -she had diarrhea with higher doses >> 500 mg twice a day >> 1000 mg in am and 500 mg with dinner ?She did not start Januvia - $. ? ?Pt checks her sugars once a day per review of her meter downloads and her report: ?- am: 186 x1 >> n/c >> 123 >> 103 >> 141, 168 >> 153 >> n/c ?- 2h after b'fast: n/c >> 74 >> 126 >> 203 ?- before lunch: 86-98 >> 58,  90-95 >> 74 >> 98 >> 62 >> 64-173, 188 ?- 2h after lunch:  79, 86 >> 145 >> 115 ?- before dinner:  62, 87, 102-131, 150, 168, 172 >> 189 >> 85-139, 148, 208 ?- 2h after dinner:  83 >> 120-130 >> n/c >> 110 >> 121 >> n/c ?- bedtime: 115, 116 >> 94  >> n/c >> 139 >> n/c ?- nighttime: n/c ?Lowest sugar was 58 >> 74 >> 62 >> 64; she has hypoglycemia awareness in the 70s. ?Highest sugar was 172 >> 189 >> 208 ? ?Glucometer: One Touch ? ?Pt's meals are: ?- Breakfast: cheerios + raisin bran or cornflakes + 2% milk or bacon + egg + toast ?- Lunch: salad or soup or sandwich ?- Dinner: veggies + starch ?- Snacks: no desserts; apple, chips ?She saw a dietitian  07/09/2015-this helped a lot. ?In 2019 had a pruritic maculopapular generalized rash. She saw PCP and dermatology and no etiology was found. She then saw Dr. Marin Olp - suspicion for mastocytosis  - BM Bx negative.  She found out she is allergic to meat.  She stopped eating meat and the rash resolved.  Of note, no alpha gal allergy.  She was also investigated for gallstones. ? ?+ Mild CKD, last BUN/creatinine:  ?Lab Results  ?Component Value Date  ? BUN 15 11/29/2021  ? CREATININE 1.01 (H) 11/29/2021  ?08/12/2021: Glu 143, BUN/Cr 21/1.19, GFR 46, Calcium 69.6 ?07/20/2020 18/1.06, GFR 50 ?10/22/2018: ACR 11.8 ? ?+ HL; last set of lipids: ?11/21/2021: 207/275/47/113 ?11/08/2020: 206/363/40/104 ?07/20/2020: 261/310/49/155 ?Lab Results  ?Component Value Date  ? CHOL 210 (H) 02/12/2020  ? HDL 44.60 02/12/2020  ? LDLCALC 131 (H) 11/01/2015  ? LDLDIRECT 134.0 02/12/2020  ? TRIG 253.0 (H) 02/12/2020  ? CHOLHDL 5 02/12/2020  ?10/22/2018: 228/352/39/119 ?10/10/2016: 246/381/40/130   ?She is on Zetia 10 mg daily and fish oil 1000 mg twice a day. ?She tried statins >> Bone pain.  She tried Benecol spread but did not like it.  She also developed a rash from fenofibrate. I suggested a referral to nutrition but she did not have this appointment.  ? ?- last  eye exam was in 12/05/2021: + DR ? ?-No numbness and tingling in her feet. ? ?She also has HTN, OSA, depression, osteoarthritis, spinal stenosis, osteopenia, GERD, CKD stage III. + h/o colon cancer. ? ?Hashimoto's hypothyroidism: ? ?Pt is on levothyroxine 112 mcg daily, taken: ?- in am ?- fasting ?- at least 30 min from b'fast ?- no calcium ?- no iron ?- no multivitamins ?- no PPIs ?- not on Biotin ?She takes Pepcid in the morning, separated by 1 to 2 hours from levothyroxine. ? ?Reviewed latest TSH levels: ?11/21/2021: TSH 0.57 (0.34-4.5) ?Lab Results  ?Component Value Date  ? TSH 0.57 08/24/2021  ? TSH 0.84 02/12/2020  ? TSH 2.140 03/22/2018  ?07/20/2020: 2.22 ?10/22/2018: TSH  0.76 ? ?In 2019 she was found to have elevated ATA antibodies: ?Lab Results  ?Component Value Date  ? THGAB 93.8 (H) 03/22/2018  ? ?We checked a thyroid ultrasound since she has a prominent left thyroid lobe on palpation: ?Thyroid U/S (03/03/2019): no nodules, heterogeneity and atrophy ? ?ROS: ?+ see HPI ? ?I reviewed pt's medications, allergies, PMH, social hx, family hx, and changes were documented in the history of present illness. Otherwise, unchanged from my initial visit note. ? ?Past Medical History:  ?Diagnosis Date  ? Arthritis   ? Colon cancer ([facility])   ? colon ca dx 2008 and 2010  ? Complication of anesthesia   ? problems waking up-patient thinks she was given too much medication  ? Gestational diabetes   ? Headache   ? headaches all the time  ? Hyperlipidemia   ? Hypertension   ? Sleep apnea   ? uses CPAP  ? Urticaria   ? ?Past Surgical History:  ?Procedure Laterality Date  ? ABDOMINAL HYSTERECTOMY    ? APPENDECTOMY    ? BACK SURGERY  2009  ? CARPAL TUNNEL RELEASE    ? COLON SURGERY    ? COLONOSCOPY WITH PROPOFOL N/A 08/10/2015  ? Procedure: COLONOSCOPY WITH PROPOFOL;  Surgeon: Garlan Fair, MD;  Location: [facility];  Service: Endoscopy;  Laterality: N/A;  ? DILATION AND CURETTAGE OF UTERUS    ? had before had hysterectomy  ? ESOPHAGOGASTRODUODENOSCOPY (EGD) WITH PROPOFOL N/A 08/10/2015  ? Procedure: ESOPHAGOGASTRODUODENOSCOPY (EGD) WITH PROPOFOL;  Surgeon: Garlan Fair, MD;  Location: [facility];  Service: Endoscopy;  Laterality: N/A;  ? EYE SURGERY    ? bilateral cataract surgery   ? ROTATOR CUFF REPAIR    ? ?Social History  ? ?Socioeconomic History  ? Marital status: Divorced  ?  Spouse name: Not on file  ? Number of children: 4  ? Years of education: 12+  ? Highest education level: Not on file  ?Occupational History  ? Occupation: [employer]  ?Tobacco Use  ? Smoking status: Never  ? Smokeless tobacco: Never  ? Tobacco comments:  ?  never used tobacco  ?Vaping Use  ? Vaping Use: Never  used  ?Substance and Sexual Activity  ? Alcohol use: No  ?  Alcohol/week: 0.0 standard drinks  ? Drug use: No  ? Sexual activity: Not on file  ?Other Topics Concern  ? Not on file  ?Social History Narrative  ? Lives at home with herself.  ? Caffeine use: Drinks coffee and tea (1 cup coffee per day). Tea rarely   ? ?Social Determinants of Health  ? ?Financial Resource Strain: Not on file  ?Food Insecurity: Not on file  ?Transportation Needs: Not on file  ?Physical Activity: Not on file  ?Stress: Not  on file  ?Social Connections: Not on file  ?Intimate Partner Violence: Not on file  ? ?Current Outpatient Medications on File Prior to Visit  ?Medication Sig Dispense Refill  ? Accu-Chek Softclix Lancets lancets Use as instructed to check sugar once daily 100 each 0  ? amLODipine (NORVASC) 10 MG tablet Take 10 mg by mouth Daily.    ? Ascorbic Acid (VITAMIN C) 500 MG CAPS daily.    ? aspirin 81 MG tablet Take 81 mg by mouth daily.    ? beta carotene w/minerals (OCUVITE) tablet Take 1 tablet by mouth daily.    ? Blood Glucose Monitoring Suppl (ACCU-CHEK GUIDE ME) w/Device KIT Use as instructed to check sugar once daily 1 kit 0  ? budesonide (ENTOCORT EC) 3 MG 24 hr capsule Take by mouth daily as needed.    ? Cholecalciferol (VITAMIN D-3 PO) Take 2,000 Units by mouth daily.    ? EPINEPHrine 0.3 mg/0.3 mL IJ SOAJ injection See admin instructions.    ? ezetimibe (ZETIA) 10 MG tablet daily.    ? famotidine (PEPCID) 20 MG tablet Take 20 mg by mouth 2 (two) times daily as needed.    ? famotidine (PEPCID) 40 MG tablet 1 tablet at bedtime.    ? fexofenadine (ALLEGRA) 180 MG tablet Take 1 tablet (180 mg total) by mouth 2 (two) times daily. 60 tablet 3  ? Fluocinolone Acetonide Body 0.01 % OIL APPLY OIL TOPICALLY TO AFFECTED AREA TWICE DAILY AS NEEDED (APPLY SPARINGLY)    ? FLUoxetine (PROZAC) 20 MG capsule Take 20 mg by mouth Daily.    ? fluticasone (FLONASE) 50 MCG/ACT nasal spray Place 1 spray into both nostrils daily.    ?  Glucosamine HCl 1500 MG TABS Take 1,500 mg by mouth daily.     ? glucose blood (ACCU-CHEK GUIDE) test strip Use as instructed to check sugar once daily 100 each 0  ? levothyroxine (SYNTHROID, LEVOTHROID) 112 MCG tablet Take

## 2022-01-03 ENCOUNTER — Ambulatory Visit
Admission: RE | Admit: 2022-01-03 | Discharge: 2022-01-03 | Disposition: A | Discharge status: Home / Self Care | Payer: Medicare PPO | Source: Ambulatory Visit | Attending: Family Medicine | Admitting: Family Medicine

## 2022-01-03 DIAGNOSIS — Z1231 Encounter for screening mammogram for malignant neoplasm of breast: Secondary | ICD-10-CM | POA: Diagnosis not present

## 2022-01-20 DIAGNOSIS — Z86018 Personal history of other benign neoplasm: Secondary | ICD-10-CM | POA: Diagnosis not present

## 2022-01-20 DIAGNOSIS — Z85828 Personal history of other malignant neoplasm of skin: Secondary | ICD-10-CM | POA: Diagnosis not present

## 2022-01-20 DIAGNOSIS — L570 Actinic keratosis: Secondary | ICD-10-CM | POA: Diagnosis not present

## 2022-01-20 DIAGNOSIS — D2372 Other benign neoplasm of skin of left lower limb, including hip: Secondary | ICD-10-CM | POA: Diagnosis not present

## 2022-01-20 DIAGNOSIS — L818 Other specified disorders of pigmentation: Secondary | ICD-10-CM | POA: Diagnosis not present

## 2022-01-20 DIAGNOSIS — L578 Other skin changes due to chronic exposure to nonionizing radiation: Secondary | ICD-10-CM | POA: Diagnosis not present

## 2022-01-20 DIAGNOSIS — L723 Sebaceous cyst: Secondary | ICD-10-CM | POA: Diagnosis not present

## 2022-01-20 DIAGNOSIS — L821 Other seborrheic keratosis: Secondary | ICD-10-CM | POA: Diagnosis not present

## 2022-01-20 DIAGNOSIS — D225 Melanocytic nevi of trunk: Secondary | ICD-10-CM | POA: Diagnosis not present

## 2022-02-28 ENCOUNTER — Other Ambulatory Visit: Payer: Self-pay | Admitting: Internal Medicine

## 2022-02-28 DIAGNOSIS — E1121 Type 2 diabetes mellitus with diabetic nephropathy: Secondary | ICD-10-CM

## 2022-03-14 ENCOUNTER — Encounter: Payer: Self-pay | Admitting: Internal Medicine

## 2022-04-27 ENCOUNTER — Encounter: Payer: Self-pay | Admitting: Internal Medicine

## 2022-04-27 ENCOUNTER — Ambulatory Visit: Payer: Medicare PPO | Admitting: Internal Medicine

## 2022-04-27 VITALS — BP 138/72 | HR 63 | Ht 62.0 in | Wt 152.2 lb

## 2022-04-27 DIAGNOSIS — E038 Other specified hypothyroidism: Secondary | ICD-10-CM

## 2022-04-27 DIAGNOSIS — E1169 Type 2 diabetes mellitus with other specified complication: Secondary | ICD-10-CM | POA: Diagnosis not present

## 2022-04-27 DIAGNOSIS — E785 Hyperlipidemia, unspecified: Secondary | ICD-10-CM | POA: Diagnosis not present

## 2022-04-27 DIAGNOSIS — E063 Autoimmune thyroiditis: Secondary | ICD-10-CM

## 2022-04-27 DIAGNOSIS — E1121 Type 2 diabetes mellitus with diabetic nephropathy: Secondary | ICD-10-CM

## 2022-04-27 LAB — POCT GLYCOSYLATED HEMOGLOBIN (HGB A1C): Hemoglobin A1C: 6.6 % — AB (ref 4.0–5.6)

## 2022-04-27 MED ORDER — METFORMIN HCL ER 500 MG PO TB24: ORAL_TABLET | ORAL | 3 refills | Status: DC

## 2022-04-27 MED ORDER — ACCU-CHEK SOFTCLIX LANCETS MISC: 3 refills | Status: DC

## 2022-04-27 MED ORDER — ACCU-CHEK GUIDE VI STRP: ORAL_STRIP | 3 refills | Status: DC

## 2022-04-27 NOTE — Progress Notes (Signed)
Patient ID: MEMPHIS CRESWELL, female   DOB: 03/10/1941, 81 y.o.   MRN: [id number]  HPI: Robin [name] is a 81 y.o.-year-old female, returning for f/u for DM2, initially GDM, then DM2 dx in 05/2015, non-insulin-dependent, controlled, with complications (mild CKD, diabetic retinopathy). Last visit 4 months ago.  Interim history: No increased urination, blurry vision, nausea, chest pain. She continues to work in the morning, until lunchtime.  Reviewed HbA1c levels: Lab Results  Component Value Date   HGBA1C 6.8 (A) 12/22/2021   HGBA1C 7.0 (A) 08/24/2021   HGBA1C 7.1 (A) 02/15/2021  07/20/2020: HbA1c 6.6%  Pt is on a regimen of: - Metformin ER 1000 mg with dinner -she had diarrhea with higher doses >> 500 mg twice a day >> 1000 mg in am and 500 mg with dinner She did not start Januvia - $.  Pt checks her sugars 1-2x a day: - am:  123 >> 103 >> 141, 168 >> 153 >> n/c - 2h after b'fast: n/c >> 74 >> 126 >> 203 >> n/c - before lunch: 64-173, 188 >> 81, 89-129, 161, 196, 218 - 2h after lunch:  79, 86 >> 145 >> 115 >> n/c - before dinner:  85-139, 148, 208 >> 83-130, 147, 196 - 2h after dinner: 120-130 >> n/c >> 110 >> 121 >> n/c - bedtime: 115, 116 >> 94  >> n/c >> 139 >> n/c - nighttime: n/c Lowest sugar was 58 >> 74 >> 62 >> 64 >> 81; she has hypoglycemia awareness in the 70s. Highest sugar was 172 >> 189 >> 208 >> 218.  Glucometer: One Touch  Pt's meals are: - Breakfast: cheerios + raisin bran or cornflakes + 2% milk or bacon + egg + toast - Lunch: salad or soup or sandwich - Dinner: veggies + starch - Snacks: no desserts; apple, chips She saw a dietitian 07/09/2015-this helped a lot. In 2019 had a pruritic maculopapular generalized rash. She saw PCP and dermatology and no etiology was found. She then saw Dr. Marin Olp - suspicion for mastocytosis  - BM Bx negative.  She found out she is allergic to meat.  She stopped eating meat and the rash resolved.  Of note, no alpha gal allergy.   She was also investigated for gallstones.  + Mild CKD, last BUN/creatinine:  Lab Results  Component Value Date   BUN 15 11/29/2021   CREATININE 1.01 (H) 11/29/2021  08/12/2021: Glu 143, BUN/Cr 21/1.19, GFR 46, Calcium 69.6 07/20/2020 18/1.06, GFR 50 10/22/2018: ACR 11.8  + HL; last set of lipids: 11/21/2021: 207/275/47/113 11/08/2020: 206/363/40/104 07/20/2020: 261/310/49/155 Lab Results  Component Value Date   CHOL 210 (H) 02/12/2020   HDL 44.60 02/12/2020   LDLCALC 131 (H) 11/01/2015   LDLDIRECT 134.0 02/12/2020   TRIG 253.0 (H) 02/12/2020   CHOLHDL 5 02/12/2020  10/22/2018: 228/352/39/119 10/10/2016: 246/381/40/130   She is on Zetia 10 mg daily and fish oil 1000 mg twice a day. She tried statins >> Bone pain.  She tried Benecol spread but did not like it.  She also developed a rash from fenofibrate. I suggested a referral to nutrition but she did not have this appointment.   - last eye exam was in 12/05/2021: + DR  - No numbness and tingling in her feet.  Last foot exam 12/22/2021.  She also has HTN, OSA, depression, osteoarthritis, spinal stenosis, osteopenia, GERD, CKD stage III. + h/o colon cancer.  Hashimoto's hypothyroidism:  Pt is on levothyroxine 112 mcg daily, taken: - in am -  fasting - at least 30 min from b'fast - no calcium - no iron - no multivitamins - no PPIs - not on Biotin She takes Pepcid in the morning, separated by 1 to 2 hours from levothyroxine.  Reviewed latest TSH levels: 11/21/2021: TSH 0.57 (0.34-4.5) Lab Results  Component Value Date   TSH 0.57 08/24/2021   TSH 0.84 02/12/2020   TSH 2.140 03/22/2018  07/20/2020: 2.22 10/22/2018: TSH 0.76  In 2019 she was found to have elevated ATA antibodies: Lab Results  Component Value Date   THGAB 93.8 (H) 03/22/2018   We checked a thyroid ultrasound since she has a prominent left thyroid lobe on palpation: Thyroid U/S (03/03/2019): no nodules, heterogeneity and atrophy  ROS: + see HPI  I  reviewed pt's medications, allergies, PMH, social hx, family hx, and changes were documented in the history of present illness. Otherwise, unchanged from my initial visit note.  Past Medical History:  Diagnosis Date   Arthritis    Colon cancer ([place])    colon ca dx 7510 and 2585   Complication of anesthesia    problems waking up-patient thinks she was given too much medication   Gestational diabetes    Headache    headaches all the time   Hyperlipidemia    Hypertension    Sleep apnea    uses CPAP   Urticaria    Past Surgical History:  Procedure Laterality Date   ABDOMINAL HYSTERECTOMY     APPENDECTOMY     BACK SURGERY  2009   CARPAL TUNNEL RELEASE     COLON SURGERY     COLONOSCOPY WITH PROPOFOL N/A 08/10/2015   Procedure: COLONOSCOPY WITH PROPOFOL;  Surgeon: Garlan Fair, MD;  Location: [facility];  Service: Endoscopy;  Laterality: N/A;   DILATION AND CURETTAGE OF UTERUS     had before had hysterectomy   ESOPHAGOGASTRODUODENOSCOPY (EGD) WITH PROPOFOL N/A 08/10/2015   Procedure: ESOPHAGOGASTRODUODENOSCOPY (EGD) WITH PROPOFOL;  Surgeon: Garlan Fair, MD;  Location: [facility];  Service: Endoscopy;  Laterality: N/A;   EYE SURGERY     bilateral cataract surgery    ROTATOR CUFF REPAIR     Social History   Socioeconomic History   Marital status: Divorced    Spouse name: Not on file   Number of children: 4   Years of education: 12+   Highest education level: Not on file  Occupational History   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   Caffeine use: Drinks coffee and tea (1 cup coffee per day).  Tea rarely    Social Determinants of Health   Financial Resource Strain: Not on file  Food Insecurity: Not on file  Transportation Needs: Not on file  Physical Activity: Not on file  Stress: Not on file  Social Connections: Not on file  Intimate Partner Violence: Not on file   Current Outpatient Medications on File Prior to Visit  Medication Sig Dispense Refill   Accu-Chek Softclix Lancets lancets Use as instructed to check sugar once daily 100 each 0  amLODipine (NORVASC) 10 MG tablet Take 10 mg by mouth Daily.     Ascorbic Acid (VITAMIN C) 500 MG CAPS daily.     aspirin 81 MG tablet Take 81 mg by mouth daily.     beta carotene w/minerals (OCUVITE) tablet Take 1 tablet by mouth daily.     Blood Glucose Monitoring Suppl (ACCU-CHEK GUIDE ME) w/Device KIT Use as instructed to check sugar once daily 1 kit 0   budesonide (ENTOCORT EC) 3 MG 24 hr capsule Take by mouth daily as needed.     Cholecalciferol (VITAMIN D-3 PO) Take 2,000 Units by mouth daily.     EPINEPHrine 0.3 mg/0.3 mL IJ SOAJ injection See admin instructions.     ezetimibe (ZETIA) 10 MG tablet daily.     famotidine (PEPCID) 20 MG tablet Take 20 mg by mouth 2 (two) times daily as needed.     famotidine (PEPCID) 40 MG tablet 1 tablet at bedtime.     fexofenadine (ALLEGRA) 180 MG tablet Take 1 tablet (180 mg total) by mouth 2 (two) times daily. 60 tablet 3   Fluocinolone Acetonide Body 0.01 % OIL APPLY OIL TOPICALLY TO AFFECTED AREA TWICE DAILY AS NEEDED (APPLY SPARINGLY)     FLUoxetine (PROZAC) 20 MG capsule Take 20 mg by mouth Daily.     fluticasone (FLONASE) 50 MCG/ACT nasal spray Place 1 spray into both nostrils daily.     Glucosamine HCl 1500 MG TABS Take 1,500 mg by mouth daily.      glucose blood (ACCU-CHEK GUIDE) test strip Use as instructed to check sugar once daily 100 each 0   levothyroxine (SYNTHROID, LEVOTHROID) 112 MCG tablet Take 112 mcg by mouth daily before breakfast.      Magnesium 400 MG TABS daily.      metFORMIN (GLUCOPHAGE-XR) 500 MG 24 hr tablet TAKE 2 TABLETS BY MOUTH IN THE MORNING AND 1 IN THE EVENING 270 tablet 0   Multiple Vitamin (MULTIVITAMIN) capsule Take 1 capsule by mouth daily.     polyvinyl alcohol (LIQUIFILM TEARS) 1.4 % ophthalmic solution Place 1 drop into both eyes as needed for dry eyes.     Probiotic Product (PROBIOTIC DAILY) CAPS Take 1 capsule by mouth every morning.      triamterene-hydrochlorothiazide (DYAZIDE) 37.5-25 MG per capsule Take 1 capsule by mouth Daily.      vitamin B-12 (CYANOCOBALAMIN) 100 MCG tablet daily.     Zinc 30 MG CAPS daily.     No current facility-administered medications on file prior to visit.   Allergies  Allergen Reactions   Ace Inhibitors Other (See Comments)    cough   Atorvastatin Other (See Comments)   Beef (Diagnostic) Other (See Comments)   Celecoxib Other (See Comments)   Rosuvastatin Other (See Comments)   Statins     Aches in her bone crestor and lipitor   Family History  Problem Relation Age of Onset   Heart attack Mother 54   Heart disease Sister    Heart attack Brother 26   Heart attack Brother 35   Aneurysm Brother 52   Breast cancer Granddaughter 12   Migraines Neg Hx    PE: There were no vitals taken for this visit.  Wt Readings from Last 3 Encounters:  12/22/21 150 lb 12.8 oz (68.4 kg)  11/29/21 152 lb (68.9 kg)  10/21/21 155 lb (70.3 kg)   Constitutional: Slight overweight, in NAD Eyes: EOMI, no exophthalmos ENT: moist mucous membranes, + slight left thyroid fullness, no cervical lymphadenopathy  Cardiovascular: RRR, No MRG Respiratory: CTA B Musculoskeletal: + Heberden and Bouchard finger deformities Skin: moist, warm, no rashes Neurological: no tremor with outstretched hands  ASSESSMENT: 1. DM2, non-insulin-dependent, controlled, with long-term complications -Mild CKD -Diabetic retinopathy  2. HL  3.  Hypothyroidism due to Hashimoto's thyroiditis  PLAN:  1. Patient with history of  controlled diabetes, on oral antidiabetic regimen with metformin only.  We could not increase the dose to 1000 mg daily due to diarrhea.  However, HbA1c levels have been fairly well controlled, so we did not have to intensify the treatment.  At last visit, HbA1c was lower, at 6.8%.  Sugars appears to be mostly at goal, with very few hyperglycemic exceptions and to low blood sugars in the 60s.  She could not explain these.  We did not change her regimen at that time. -At today's visit, sugars remain mostly at goal, with only occasional mild hyperglycemic spikes after dietary indiscretions.  For now, no need to change her regimen.  I did advise her again to write down possible reasons for the high blood sugars in her log. - I suggested to:  Patient Instructions  Please continue Metformin ER 1000 mg in am and 500 mg with dinner.  Check sugars 1x a day, rotating check times and write them down.  Continue Fish oil 1000 mg 2x a day.  Continue Levothyroxine 112 mcg daily.  Take the thyroid hormone every day, with water, at least 30 minutes before breakfast, separated by at least 4 hours from: - acid reflux medications - calcium - iron - multivitamins  Please return in 6 months with your sugar log.   - we checked her HbA1c: 6.6% (lower) - advised to check sugars at different times of the day - 1x a day, rotating check times - advised for yearly eye exams >> she is UTD - return to clinic in 6 months  2. HL -Reviewed latest lipid panel from 11/21/2021: 207/275/47/113 -triglycerides and LDL elevated -She is intolerant to statins.  Also, she had a rash with fenofibrate.  Currently on Zetia 10 mg daily and fish oil 1000 mg twice a day-tolerating well.   3.  Hashimoto's hypothyroidism -In the past, her left thyroid lobe appeared more prominent on palpation so we checked a thyroid ultrasound.  This was negative for nodules. - latest thyroid labs reviewed with pt. >> normal on 11/21/2021: TSH 0.57 -  she continues on LT4 112 mcg daily - refilled by PCP - pt feels good on this dose. - we discussed about taking the thyroid hormone every day, with water, >30 minutes before breakfast, separated by >4 hours from acid reflux medications, calcium, iron, multivitamins. Pt. is taking it correctly.  Philemon Kingdom, MD PhD [facility]

## 2022-04-27 NOTE — Patient Instructions (Addendum)
Please continue Metformin ER 1000 mg in am and 500 mg with dinner.  Check sugars 1x a day, rotating check times and write them down.  Continue Fish oil 1000 mg 2x a day.  Continue Levothyroxine 112 mcg daily.  Take the thyroid hormone every day, with water, at least 30 minutes before breakfast, separated by at least 4 hours from: - acid reflux medications - calcium - iron - multivitamins  Please return in 6 months with your sugar log.

## 2022-05-11 ENCOUNTER — Other Ambulatory Visit: Payer: Self-pay | Admitting: Internal Medicine

## 2022-05-11 DIAGNOSIS — E1121 Type 2 diabetes mellitus with diabetic nephropathy: Secondary | ICD-10-CM

## 2022-06-01 ENCOUNTER — Encounter: Payer: Self-pay | Admitting: Hematology & Oncology

## 2022-06-01 ENCOUNTER — Inpatient Hospital Stay: Payer: Medicare PPO | Attending: Hematology & Oncology

## 2022-06-01 ENCOUNTER — Inpatient Hospital Stay: Payer: Medicare PPO | Admitting: Hematology & Oncology

## 2022-06-01 VITALS — BP 145/65 | HR 63 | Temp 98.3°F | Resp 17 | Wt 148.4 lb

## 2022-06-01 DIAGNOSIS — C184 Malignant neoplasm of transverse colon: Secondary | ICD-10-CM

## 2022-06-01 DIAGNOSIS — C185 Malignant neoplasm of splenic flexure: Secondary | ICD-10-CM | POA: Diagnosis not present

## 2022-06-01 DIAGNOSIS — Z9221 Personal history of antineoplastic chemotherapy: Secondary | ICD-10-CM | POA: Insufficient documentation

## 2022-06-01 DIAGNOSIS — D649 Anemia, unspecified: Secondary | ICD-10-CM | POA: Insufficient documentation

## 2022-06-01 DIAGNOSIS — Z85038 Personal history of other malignant neoplasm of large intestine: Secondary | ICD-10-CM | POA: Insufficient documentation

## 2022-06-01 DIAGNOSIS — E119 Type 2 diabetes mellitus without complications: Secondary | ICD-10-CM | POA: Diagnosis not present

## 2022-06-01 DIAGNOSIS — N289 Disorder of kidney and ureter, unspecified: Secondary | ICD-10-CM | POA: Diagnosis not present

## 2022-06-01 LAB — CMP (CANCER CENTER ONLY)
ALT: 26 U/L (ref 0–44)
AST: 21 U/L (ref 15–41)
Albumin: 4.6 g/dL (ref 3.5–5.0)
Alkaline Phosphatase: 39 U/L (ref 38–126)
Anion gap: 10 (ref 5–15)
BUN: 24 mg/dL — ABNORMAL HIGH (ref 8–23)
CO2: 28 mmol/L (ref 22–32)
Calcium: 11.3 mg/dL — ABNORMAL HIGH (ref 8.9–10.3)
Chloride: 100 mmol/L (ref 98–111)
Creatinine: 1.26 mg/dL — ABNORMAL HIGH (ref 0.44–1.00)
GFR, Estimated: 43 mL/min — ABNORMAL LOW (ref >60)
Glucose, Bld: 132 mg/dL — ABNORMAL HIGH (ref 70–99)
Potassium: 3.9 mmol/L (ref 3.5–5.1)
Sodium: 138 mmol/L (ref 135–145)
Total Bilirubin: 0.7 mg/dL (ref 0.3–1.2)
Total Protein: 6.9 g/dL (ref 6.5–8.1)

## 2022-06-01 LAB — CBC WITH DIFFERENTIAL (CANCER CENTER ONLY)
Abs Immature Granulocytes: 0.05 10*3/uL (ref 0.00–0.07)
Basophils Absolute: 0 10*3/uL (ref 0.0–0.1)
Basophils Relative: 1 %
Eosinophils Absolute: 0.3 10*3/uL (ref 0.0–0.5)
Eosinophils Relative: 4 %
HCT: 33.7 % — ABNORMAL LOW (ref 36.0–46.0)
Hemoglobin: 11.3 g/dL — ABNORMAL LOW (ref 12.0–15.0)
Immature Granulocytes: 1 %
Lymphocytes Relative: 37 %
Lymphs Abs: 2.5 10*3/uL (ref 0.7–4.0)
MCH: 29.8 pg (ref 26.0–34.0)
MCHC: 33.5 g/dL (ref 30.0–36.0)
MCV: 88.9 fL (ref 80.0–100.0)
Monocytes Absolute: 0.6 10*3/uL (ref 0.1–1.0)
Monocytes Relative: 8 %
Neutro Abs: 3.2 10*3/uL (ref 1.7–7.7)
Neutrophils Relative %: 49 %
Platelet Count: 178 10*3/uL (ref 150–400)
RBC: 3.79 MIL/uL — ABNORMAL LOW (ref 3.87–5.11)
RDW: 11.9 % (ref 11.5–15.5)
WBC Count: 6.6 10*3/uL (ref 4.0–10.5)
nRBC: 0 % (ref 0.0–0.2)

## 2022-06-01 LAB — RETICULOCYTES
Immature Retic Fract: 10.1 % (ref 2.3–15.9)
RBC.: 3.82 MIL/uL — ABNORMAL LOW (ref 3.87–5.11)
Retic Count, Absolute: 57.7 10*3/uL (ref 19.0–186.0)
Retic Ct Pct: 1.5 % (ref 0.4–3.1)

## 2022-06-01 LAB — IRON AND IRON BINDING CAPACITY (CC-WL,HP ONLY)
Iron: 93 ug/dL (ref 28–170)
Saturation Ratios: 23 % (ref 10.4–31.8)
TIBC: 399 ug/dL (ref 250–450)
UIBC: 306 ug/dL (ref 148–442)

## 2022-06-01 LAB — SAVE SMEAR(SSMR), FOR PROVIDER SLIDE REVIEW

## 2022-06-01 LAB — FERRITIN: Ferritin: 49 ng/mL (ref 11–307)

## 2022-06-01 NOTE — Progress Notes (Signed)
Hematology and Oncology Follow Up Visit  MARCUS GROLL [id number] December 08, 1940 81 y.o. 06/01/2022   Principle Diagnosis:  History of locally recurrent adenocarcinoma of the colon  Current Therapy:   Observation    Interim History:  Ms. Hur is here today for follow-up.  We last saw her back in March.  Since then, she been doing pretty well.  She really has had no specific complaints.  She is always doing stuff with her family.  She was at the beach with her family.  That a good time.  In October, she will be going to [facility] to see an evangelist.  I know that she will have a wonderful time.  She has had no problems with cough or shortness of breath.  She has had no change in bowel or bladder habits.  She has had no rashes.  There is been no leg swelling.  She has had no headache.  She does have diabetes.  She is trying to manage this.  She had a mammogram back in April.  Everything looked fine on the mammogram.  Currently, I would say performance status is probably ECOG 1.     Medications:  Allergies as of 06/01/2022       Reactions   Ace Inhibitors Other (See Comments)   cough   Atorvastatin Other (See Comments)   Beef (diagnostic) Other (See Comments)   Celecoxib Other (See Comments)   Rosuvastatin Other (See Comments)   Statins    Aches in her bone crestor and lipitor        Medication List        Accurate as of June 01, 2022 11:25 AM. If you have any questions, ask your nurse or doctor.          Accu-Chek Guide Me w/Device Kit Use as instructed to check sugar once daily   Accu-Chek Softclix Lancets lancets Use as instructed to check sugar once daily   amLODipine 10 MG tablet Commonly known as: NORVASC Take 10 mg by mouth Daily.   aspirin 81 MG tablet Take 81 mg by mouth daily.   beta carotene w/minerals tablet Take 1 tablet by mouth daily.   budesonide 3 MG 24 hr capsule Commonly known as: ENTOCORT EC Take by mouth daily as needed.    EPINEPHrine 0.3 mg/0.3 mL Soaj injection Commonly known as: EPI-PEN See admin instructions.   ezetimibe 10 MG tablet Commonly known as: ZETIA daily.   famotidine 20 MG tablet Commonly known as: PEPCID Take 20 mg by mouth 2 (two) times daily as needed.   famotidine 40 MG tablet Commonly known as: PEPCID 1 tablet at bedtime.   fexofenadine 180 MG tablet Commonly known as: ALLEGRA Take 1 tablet (180 mg total) by mouth 2 (two) times daily.   Fluocinolone Acetonide Body 0.01 % Oil APPLY OIL TOPICALLY TO AFFECTED AREA TWICE DAILY AS NEEDED (APPLY SPARINGLY)   FLUoxetine 20 MG capsule Commonly known as: PROZAC Take 20 mg by mouth Daily.   fluticasone 50 MCG/ACT nasal spray Commonly known as: FLONASE Place 1 spray into both nostrils daily.   Glucosamine HCl 1500 MG Tabs Take 1,500 mg by mouth daily.   levothyroxine 112 MCG tablet Commonly known as: SYNTHROID Take 112 mcg by mouth daily before breakfast.   Magnesium 400 MG Tabs daily.   metFORMIN 500 MG 24 hr tablet Commonly known as: GLUCOPHAGE-XR TAKE 2 TABLETS BY MOUTH IN THE MORNING AND 1 IN THE EVENING   multivitamin capsule Take 1 capsule  by mouth daily.   OneTouch Verio test strip Generic drug: glucose blood USE TO CHECK YOU BLOOD SUGAR ONCE OR TWICE DAILY   polyvinyl alcohol 1.4 % ophthalmic solution Commonly known as: LIQUIFILM TEARS Place 1 drop into both eyes as needed for dry eyes.   Probiotic Daily Caps Take 1 capsule by mouth every morning.   triamterene-hydrochlorothiazide 37.5-25 MG capsule Commonly known as: DYAZIDE Take 1 capsule by mouth Daily.   vitamin B-12 100 MCG tablet Commonly known as: CYANOCOBALAMIN daily.   Vitamin C 500 MG Caps daily.   VITAMIN D-3 PO Take 2,000 Units by mouth daily.   Zinc 30 MG Caps daily.        Allergies:  Allergies  Allergen Reactions   Ace Inhibitors Other (See Comments)    cough   Atorvastatin Other (See Comments)   Beef (Diagnostic)  Other (See Comments)   Celecoxib Other (See Comments)   Rosuvastatin Other (See Comments)   Statins     Aches in her bone crestor and lipitor    Past Medical History, Surgical history, Social history, and Family History were reviewed and updated.  Review of Systems: Review of Systems  Constitutional: Negative.   HENT: Negative.    Eyes: Negative.   Respiratory: Negative.    Cardiovascular: Negative.   Gastrointestinal: Negative.   Genitourinary: Negative.   Musculoskeletal: Negative.   Skin: Negative.   Endo/Heme/Allergies: Negative.   Psychiatric/Behavioral: Negative.      Physical Exam:  weight is 148 lb 6.4 oz (67.3 kg). Her oral temperature is 98.3 F (36.8 C). Her blood pressure is 145/65 (abnormal) and her pulse is 63. Her respiration is 17 and oxygen saturation is 97%.   Wt Readings from Last 3 Encounters:  06/01/22 148 lb 6.4 oz (67.3 kg)  04/27/22 152 lb 3.2 oz (69 kg)  12/22/21 150 lb 12.8 oz (68.4 kg)    Physical Exam Vitals reviewed.  HENT:     Head: Normocephalic and atraumatic.  Eyes:     Pupils: Pupils are equal, round, and reactive to light.  Cardiovascular:     Rate and Rhythm: Normal rate and regular rhythm.     Heart sounds: Normal heart sounds.  Pulmonary:     Effort: Pulmonary effort is normal.     Breath sounds: Normal breath sounds.  Abdominal:     General: Bowel sounds are normal.     Palpations: Abdomen is soft.  Musculoskeletal:        General: No tenderness or deformity. Normal range of motion.     Cervical back: Normal range of motion.  Lymphadenopathy:     Cervical: No cervical adenopathy.  Skin:    General: Skin is warm and dry.     Findings: No erythema or rash.  Neurological:     Mental Status: She is alert and oriented to person, place, and time.  Psychiatric:        Behavior: Behavior normal.        Thought Content: Thought content normal.        Judgment: Judgment normal.      Lab Results  Component Value Date    WBC 6.6 06/01/2022   HGB 11.3 (L) 06/01/2022   HCT 33.7 (L) 06/01/2022   MCV 88.9 06/01/2022   PLT 178 06/01/2022   Lab Results  Component Value Date   FERRITIN 45 06/01/2021   IRON 102 06/01/2021   TIBC 368 06/01/2021   UIBC 266 06/01/2021   IRONPCTSAT 28 06/01/2021  Lab Results  Component Value Date   RETICCTPCT 1.5 06/01/2022   RBC 3.79 (L) 06/01/2022   RBC 3.82 (L) 06/01/2022   RETICCTABS 48.7 12/17/2008   No results found for: "KPAFRELGTCHN", "LAMBDASER", "KAPLAMBRATIO" No results found for: "IGGSERUM", "IGA", "IGMSERUM" No results found for: "TOTALPROTELP", "ALBUMINELP", "A1GS", "A2GS", "BETS", "BETA2SER", "GAMS", "MSPIKE", "SPEI"   Chemistry      Component Value Date/Time   NA 138 06/01/2022 1011   NA 142 03/22/2018 1059   NA 139 05/21/2017 0838   K 3.9 06/01/2022 1011   K 4.3 05/21/2017 0838   CL 100 06/01/2022 1011   CL 102 05/18/2016 1505   CL 100 11/01/2015 0932   CO2 28 06/01/2022 1011   CO2 28 05/21/2017 0838   BUN 24 (H) 06/01/2022 1011   BUN 13 03/22/2018 1059   BUN 20.8 05/21/2017 0838   CREATININE 1.26 (H) 06/01/2022 1011   CREATININE 1.0 05/21/2017 0838      Component Value Date/Time   CALCIUM 11.3 (H) 06/01/2022 1011   CALCIUM 10.5 (H) 05/21/2017 0838   ALKPHOS 39 06/01/2022 1011   ALKPHOS 49 05/21/2017 0838   AST 21 06/01/2022 1011   AST 22 05/21/2017 0838   ALT 26 06/01/2022 1011   ALT 33 05/21/2017 0838   BILITOT 0.7 06/01/2022 1011   BILITOT 0.57 05/21/2017 0838      Impression and Plan: Ms. Matlock is a very pleasant 80 yo caucasian female with history of locally recurrent adenocarcinoma of the colon with resection in 2000. She received adjuvant FOLFOX.  I see no evidence of recurrent disease.  She has mild anemia.  Again I am not really worried about this.  She not symptomatic with it.  She does have mild renal insufficiency.  I am sure that her erythropoietin level is on the lower side.  I still think we can probably get her back  in another 6 months.  Peter R Ennever, MD 9/7/202311:25 AM  

## 2022-06-02 LAB — ERYTHROPOIETIN: Erythropoietin: 8.2 m[IU]/mL (ref 2.6–18.5)

## 2022-06-27 DIAGNOSIS — D485 Neoplasm of uncertain behavior of skin: Secondary | ICD-10-CM | POA: Diagnosis not present

## 2022-06-27 DIAGNOSIS — L309 Dermatitis, unspecified: Secondary | ICD-10-CM | POA: Diagnosis not present

## 2022-06-27 DIAGNOSIS — L820 Inflamed seborrheic keratosis: Secondary | ICD-10-CM | POA: Diagnosis not present

## 2022-06-27 DIAGNOSIS — C44729 Squamous cell carcinoma of skin of left lower limb, including hip: Secondary | ICD-10-CM | POA: Diagnosis not present

## 2022-07-17 DIAGNOSIS — C44729 Squamous cell carcinoma of skin of left lower limb, including hip: Secondary | ICD-10-CM | POA: Diagnosis not present

## 2022-08-22 DIAGNOSIS — Z5189 Encounter for other specified aftercare: Secondary | ICD-10-CM | POA: Diagnosis not present

## 2022-08-22 DIAGNOSIS — Z8589 Personal history of malignant neoplasm of other organs and systems: Secondary | ICD-10-CM | POA: Diagnosis not present

## 2022-08-22 DIAGNOSIS — L570 Actinic keratosis: Secondary | ICD-10-CM | POA: Diagnosis not present

## 2022-10-30 ENCOUNTER — Encounter: Payer: Self-pay | Admitting: Internal Medicine

## 2022-10-30 ENCOUNTER — Ambulatory Visit: Payer: Medicare PPO | Admitting: Internal Medicine

## 2022-10-30 VITALS — BP 122/74 | HR 72 | Ht 62.0 in | Wt 152.6 lb

## 2022-10-30 DIAGNOSIS — E063 Autoimmune thyroiditis: Secondary | ICD-10-CM

## 2022-10-30 DIAGNOSIS — E038 Other specified hypothyroidism: Secondary | ICD-10-CM | POA: Diagnosis not present

## 2022-10-30 DIAGNOSIS — E785 Hyperlipidemia, unspecified: Secondary | ICD-10-CM | POA: Diagnosis not present

## 2022-10-30 DIAGNOSIS — E1169 Type 2 diabetes mellitus with other specified complication: Secondary | ICD-10-CM

## 2022-10-30 DIAGNOSIS — E1121 Type 2 diabetes mellitus with diabetic nephropathy: Secondary | ICD-10-CM

## 2022-10-30 LAB — POCT GLYCOSYLATED HEMOGLOBIN (HGB A1C): Hemoglobin A1C: 6.6 % — AB (ref 4.0–5.6)

## 2022-10-30 NOTE — Patient Instructions (Addendum)
Please continue Metformin ER 1000 mg in am and 500 mg with dinner.  Check sugars 1x a day, rotating check times and write them down.  Continue Levothyroxine 112 mcg daily.  Take the thyroid hormone every day, with water, at least 30 minutes before breakfast, separated by at least 4 hours from: - acid reflux medications - calcium - iron - multivitamins  Move multivitamins >4h after Levothyroxine.  Please return in 6 months with your sugar log.

## 2022-10-30 NOTE — Progress Notes (Signed)
Patient ID: Robin [name], female   DOB: [birth year], 82 y.o.   MRN: [id number]  HPI: Robin [name] is a 82 y.o.-year-old female, returning for f/u for DM2, initially GDM, then DM2 dx in 05/2015, non-insulin-dependent, controlled, with complications (mild CKD, diabetic retinopathy). Last visit 6 months ago.  Interim history: No increased urination, blurry vision, nausea, chest pain.  Reviewed HbA1c levels: Lab Results  Component Value Date   HGBA1C 7.4 (A) 04/27/2022   HGBA1C 6.8 (A) 12/22/2021   HGBA1C 7.0 (A) 08/24/2021  07/20/2020: HbA1c 6.6%  Pt is on a regimen of: - Metformin ER 1000 mg with dinner -she had diarrhea with higher doses >> 500 mg twice a day >> 1000 mg in am and 500 mg with dinner She did not start Januvia - $.  Pt checks her sugars 1-2x a day: - am:  123 >> 103 >> 141, 168 >> 153 >> n/c >> 139 - 2h after b'fast: n/c >> 74 >> 126 >> 203 >> n/c  - before lunch: 81, 89-129, 161, 196, 218 >> 81-163, 175, 188 - 2h after lunch:  79, 86 >> 145 >> 115 >> n/c - before dinner:  85-139, 148, 208 >> 83-130, 147, 196 >> 80 - 2h after dinner: 120-130 >> n/c >> 110 >> 121 >> n/c - bedtime: 115, 116 >> 94  >> n/c >> 139 >> n/c - nighttime: n/c Lowest sugar was 58 >> 74 >> 62 >> 64 >> 81; she has hypoglycemia awareness in the 70s. Highest sugar was 172 >> 189 >> 208 >> 218 >> 196.  Glucometer: One Touch  Pt's meals are: - Breakfast: cheerios + raisin bran or cornflakes + 2% milk or bacon + egg + toast - Lunch: salad or soup or sandwich - Dinner: veggies + starch - Snacks: no desserts; apple, chips She saw a dietitian 07/09/2015-this helped a lot. In 2019 had a pruritic maculopapular generalized rash. She saw PCP and dermatology and no etiology was found. She then saw Dr. Marin [name] - suspicion for mastocytosis  - BM Bx negative.  She found out she is allergic to meat.  She stopped eating meat and the rash resolved.  Of note, no alpha gal allergy.  She was also investigated for  gallstones.  + Mild CKD, last BUN/creatinine:  Lab Results  Component Value Date   BUN 24 (H) 06/01/2022   CREATININE 1.26 (H) 06/01/2022   Microalbumin Panel Reviewed date:11/27/2021 09:24:17 AM Interpretation:Abnormal Performing Lab: Notes/Report: Testing Performed at: [facility], [address], [place], [place] [address]  MA/CR ratio 39.1 0.0-30.0 mg/G    UMA 1.37 0.00-1.90 mg/dL    UCR 35      08/12/2021: Glu 143, BUN/Cr 21/1.19, GFR 46, Calcium 69.6 07/20/2020 18/1.06, GFR 50 10/22/2018: ACR 11.8  + HL; last set of lipids: 11/21/2021: 207/275/47/113 11/08/2020: 206/363/40/104 07/20/2020: 261/310/49/155 Lab Results  Component Value Date   CHOL 210 (H) 02/12/2020   HDL 44.60 02/12/2020   LDLCALC 131 (H) 11/01/2015   LDLDIRECT 134.0 02/12/2020   TRIG 253.0 (H) 02/12/2020   CHOLHDL 5 02/12/2020  10/22/2018: 228/352/39/119 10/10/2016: 246/381/40/130   She is on Zetia 10 mg daily and fish oil 1000 mg twice a day. She tried statins >> Bone pain.  She tried Benecol spread but did not like it.  She also developed a rash from fenofibrate. I suggested a referral to nutrition but she did not have this appointment.   - last eye exam was in 12/05/2021: + DR  -  No numbness and tingling in her feet.  Last foot exam 12/22/2021.  She also has HTN, OSA, depression, osteoarthritis, spinal stenosis, osteopenia, GERD, CKD stage III. + h/o colon cancer.  Hashimoto's hypothyroidism:  Pt is on levothyroxine 112 mcg daily, taken: - in am - fasting - at least 30 min from b'fast - no calcium - no iron - + multivitamins  - 1h after LT4  - no PPIs - not on Biotin She takes Pepcid in the morning, separated by 1 to 2 hours from levothyroxine.  Reviewed latest TSH levels: 11/21/2021: TSH 0.57 (0.34-4.5) Lab Results  Component Value Date   TSH 0.57 08/24/2021   TSH 0.84 02/12/2020   TSH 2.140 03/22/2018  07/20/2020: 2.22 10/22/2018: TSH 0.76  In 2019 she was found to have  elevated ATA antibodies: Lab Results  Component Value Date   THGAB 93.8 (H) 03/22/2018   We checked a thyroid ultrasound since she has a prominent left thyroid lobe on palpation: Thyroid U/S (03/03/2019): no nodules, heterogeneity and atrophy  ROS: + see HPI  I reviewed pt's medications, allergies, PMH, social hx, family hx, and changes were documented in the history of present illness. Otherwise, unchanged from my initial visit note.  Past Medical History:  Diagnosis Date   Arthritis    Colon cancer ([place])    colon ca dx 6834 and 1962   Complication of anesthesia    problems waking up-patient thinks she was given too much medication   Gestational diabetes    Headache    headaches all the time   Hyperlipidemia    Hypertension    Sleep apnea    uses CPAP   Urticaria    Past Surgical History:  Procedure Laterality Date   ABDOMINAL HYSTERECTOMY     APPENDECTOMY     BACK SURGERY  2009   CARPAL TUNNEL RELEASE     COLON SURGERY     COLONOSCOPY WITH PROPOFOL N/A 08/10/2015   Procedure: COLONOSCOPY WITH PROPOFOL;  Surgeon: Garlan Fair, MD;  Location: [facility];  Service: Endoscopy;  Laterality: N/A;   DILATION AND CURETTAGE OF UTERUS     had before had hysterectomy   ESOPHAGOGASTRODUODENOSCOPY (EGD) WITH PROPOFOL N/A 08/10/2015   Procedure: ESOPHAGOGASTRODUODENOSCOPY (EGD) WITH PROPOFOL;  Surgeon: Garlan Fair, MD;  Location: [facility];  Service: Endoscopy;  Laterality: N/A;   EYE SURGERY     bilateral cataract surgery    ROTATOR CUFF REPAIR     Social History   Socioeconomic History   Marital status: Divorced    Spouse name: Not on file   Number of children: 4   Years of education: 12+   Highest education level: Not on file  Occupational History   Occupation: [employer] room  Tobacco Use   Smoking status: Never   Smokeless tobacco: Never   Tobacco comments:    never used tobacco  Vaping Use   Vaping Use: Never used  Substance and Sexual Activity    Alcohol use: No    Alcohol/week: 0.0 standard drinks of alcohol   Drug use: No   Sexual activity: Not on file  Other Topics Concern   Not on file  Social History Narrative   Lives at home with herself.   Caffeine use: Drinks coffee and tea (1 cup coffee per day). Tea rarely    Social Determinants of Health   Financial Resource Strain: Not on file  Food Insecurity: Not on file  Transportation Needs: Not on file  Physical Activity: Not  on file  Stress: Not on file  Social Connections: Not on file  Intimate Partner Violence: Not on file   Current Outpatient Medications on File Prior to Visit  Medication Sig Dispense Refill   Accu-Chek Softclix Lancets lancets Use as instructed to check sugar once daily 100 each 3   amLODipine (NORVASC) 10 MG tablet Take 10 mg by mouth Daily.     Ascorbic Acid (VITAMIN C) 500 MG CAPS daily.     aspirin 81 MG tablet Take 81 mg by mouth daily.     beta carotene w/minerals (OCUVITE) tablet Take 1 tablet by mouth daily.     Blood Glucose Monitoring Suppl (ACCU-CHEK GUIDE ME) w/Device KIT Use as instructed to check sugar once daily 1 kit 0   budesonide (ENTOCORT EC) 3 MG 24 hr capsule Take by mouth daily as needed.     Cholecalciferol (VITAMIN D-3 PO) Take 2,000 Units by mouth daily.     EPINEPHrine 0.3 mg/0.3 mL IJ SOAJ injection See admin instructions.     ezetimibe (ZETIA) 10 MG tablet daily.     famotidine (PEPCID) 20 MG tablet Take 20 mg by mouth 2 (two) times daily as needed. (Patient not taking: Reported on 06/01/2022)     famotidine (PEPCID) 40 MG tablet 1 tablet at bedtime.     fexofenadine (ALLEGRA) 180 MG tablet Take 1 tablet (180 mg total) by mouth 2 (two) times daily. 60 tablet 3   Fluocinolone Acetonide Body 0.01 % OIL APPLY OIL TOPICALLY TO AFFECTED AREA TWICE DAILY AS NEEDED (APPLY SPARINGLY)     FLUoxetine (PROZAC) 20 MG capsule Take 20 mg by mouth Daily.     fluticasone (FLONASE) 50 MCG/ACT nasal spray Place 1 spray into both nostrils  daily.     Glucosamine HCl 1500 MG TABS Take 1,500 mg by mouth daily.      levothyroxine (SYNTHROID, LEVOTHROID) 112 MCG tablet Take 112 mcg by mouth daily before breakfast.      Magnesium 400 MG TABS daily.     metFORMIN (GLUCOPHAGE-XR) 500 MG 24 hr tablet TAKE 2 TABLETS BY MOUTH IN THE MORNING AND 1 IN THE EVENING 270 tablet 3   Multiple Vitamin (MULTIVITAMIN) capsule Take 1 capsule by mouth daily.     ONETOUCH VERIO test strip USE TO CHECK YOU BLOOD SUGAR ONCE OR TWICE DAILY 100 each 0   polyvinyl alcohol (LIQUIFILM TEARS) 1.4 % ophthalmic solution Place 1 drop into both eyes as needed for dry eyes.     Probiotic Product (PROBIOTIC DAILY) CAPS Take 1 capsule by mouth every morning.      triamterene-hydrochlorothiazide (DYAZIDE) 37.5-25 MG per capsule Take 1 capsule by mouth Daily.      vitamin B-12 (CYANOCOBALAMIN) 100 MCG tablet daily.     Zinc 30 MG CAPS daily.     No current facility-administered medications on file prior to visit.   Allergies  Allergen Reactions   Ace Inhibitors Other (See Comments)    cough   Atorvastatin Other (See Comments)   Beef (Diagnostic) Other (See Comments)   Celecoxib Other (See Comments)   Rosuvastatin Other (See Comments)   Statins     Aches in her bone crestor and lipitor   Family History  Problem Relation Age of Onset   Heart attack Mother 103   Heart disease Sister    Heart attack Brother 76   Heart attack Brother 50   Aneurysm Brother 72   Breast cancer Granddaughter 42   Migraines Neg Hx  PE: BP 122/74 (BP Location: Right Arm, Patient Position: Sitting, Cuff Size: Normal)   Pulse 72   Ht '5\' 2"'$  (1.575 m)   Wt 152 lb 9.6 oz (69.2 kg)   SpO2 98%   BMI 27.91 kg/m   Wt Readings from Last 3 Encounters:  10/30/22 152 lb 9.6 oz (69.2 kg)  06/01/22 148 lb 6.4 oz (67.3 kg)  04/27/22 152 lb 3.2 oz (69 kg)   Constitutional: Slight overweight, in NAD Eyes: EOMI, no exophthalmos ENT:  + slight left thyroid fullness, no cervical  lymphadenopathy Cardiovascular: RRR, No MRG Respiratory: CTA B Musculoskeletal: + Heberden and Bouchard finger deformities Skin: no rashes Neurological: Very fine tremor with outstretched hands  ASSESSMENT: 1. DM2, non-insulin-dependent, controlled, with long-term complications -Mild CKD -Diabetic retinopathy  2. HL  3.  Hypothyroidism due to Hashimoto's thyroiditis  PLAN:  1. Patient with history of controlled type 2 diabetes, on oral antidiabetic regimen with metformin only.  We could not increase the dose further due to diarrhea.  However, HbA1c improved at last visit - 6.6%,  so we did not change her regimen.  Sugars were mostly at goal, with occasional mild hyperglycemic spikes after dietary indiscretions.  I did advise her to write down possible reasons for the high blood sugars in her log. -At today's visit, she is mostly checking blood sugars midday and for the majority of the time, they are at goal.  She only has an occasional mildly hyperglycemic spike.  However, A1c is stable since last visit, at goal.  We can continue the same regimen for now. - I suggested to:  Patient Instructions  Please continue Metformin ER 1000 mg in am and 500 mg with dinner.  Check sugars 1x a day, rotating check times and write them down.  Continue Levothyroxine 112 mcg daily.  Take the thyroid hormone every day, with water, at least 30 minutes before breakfast, separated by at least 4 hours from: - acid reflux medications - calcium - iron - multivitamins  Please return in 6 months with your sugar log.   - we checked her HbA1c: 6.6% (stable) - advised to check sugars at different times of the day - 1x a day, rotating check times - advised for yearly eye exams >> she is UTD - she is due for labs, which she will have with PCP later this month - return to clinic in 6 months  2. HL - Reviewed latest lipid panel from 11/21/2021: Triglycerides and LDL above target:207/275/47/113 -She is  intolerant to statins and had a rash with fenofibrate.  Currently on Zetia 10 mg daily and fish oil 1000 mg twice a day.   3.  Hashimoto's hypothyroidism -In the past, her left thyroid lobe appeared more prominent on palpation so we checked a thyroid ultrasound.  This was negative for nodules. - latest thyroid labs reviewed with pt. >> normal in 10/2021 - she continues on LT4 112 mcg daily - pt feels good on this dose. - we discussed about taking the thyroid hormone every day, with water, >30 minutes before breakfast, separated by >4 hours from acid reflux medications, calcium, iron, multivitamins. Pt. was taking it correctly but now takes multivitamins only 1 hour after levothyroxine.  I advised her to move these at least 4 hours away. -She has an appointment coming up with PCP later this month and will have a TSH then.  Philemon Kingdom, MD PhD [facility] Endocrin[facility]

## 2022-10-30 NOTE — Addendum Note (Signed)
Addended by: Lauralyn Primes on: 10/30/2022 10:01 AM   Modules accepted: Orders

## 2022-11-23 ENCOUNTER — Other Ambulatory Visit: Payer: Self-pay | Admitting: Internal Medicine

## 2022-11-23 DIAGNOSIS — Z0000 Encounter for general adult medical examination without abnormal findings: Secondary | ICD-10-CM | POA: Diagnosis not present

## 2022-11-23 DIAGNOSIS — Z6827 Body mass index (BMI) 27.0-27.9, adult: Secondary | ICD-10-CM | POA: Diagnosis not present

## 2022-11-23 DIAGNOSIS — Z1389 Encounter for screening for other disorder: Secondary | ICD-10-CM | POA: Diagnosis not present

## 2022-11-23 DIAGNOSIS — Z23 Encounter for immunization: Secondary | ICD-10-CM | POA: Diagnosis not present

## 2022-11-23 DIAGNOSIS — E039 Hypothyroidism, unspecified: Secondary | ICD-10-CM | POA: Diagnosis not present

## 2022-11-23 DIAGNOSIS — E782 Mixed hyperlipidemia: Secondary | ICD-10-CM | POA: Diagnosis not present

## 2022-11-23 DIAGNOSIS — E1121 Type 2 diabetes mellitus with diabetic nephropathy: Secondary | ICD-10-CM

## 2022-11-23 MED ORDER — ONETOUCH VERIO VI STRP: ORAL_STRIP | 3 refills | Status: DC

## 2022-11-27 DIAGNOSIS — E1122 Type 2 diabetes mellitus with diabetic chronic kidney disease: Secondary | ICD-10-CM | POA: Diagnosis not present

## 2022-11-27 DIAGNOSIS — M79602 Pain in left arm: Secondary | ICD-10-CM | POA: Diagnosis not present

## 2022-11-27 DIAGNOSIS — M79601 Pain in right arm: Secondary | ICD-10-CM | POA: Diagnosis not present

## 2022-11-27 DIAGNOSIS — I700 Atherosclerosis of aorta: Secondary | ICD-10-CM | POA: Diagnosis not present

## 2022-11-27 DIAGNOSIS — N1831 Chronic kidney disease, stage 3a: Secondary | ICD-10-CM | POA: Diagnosis not present

## 2022-11-27 DIAGNOSIS — F3342 Major depressive disorder, recurrent, in full remission: Secondary | ICD-10-CM | POA: Diagnosis not present

## 2022-11-27 DIAGNOSIS — E039 Hypothyroidism, unspecified: Secondary | ICD-10-CM | POA: Diagnosis not present

## 2022-11-27 DIAGNOSIS — R42 Dizziness and giddiness: Secondary | ICD-10-CM | POA: Diagnosis not present

## 2022-11-27 DIAGNOSIS — G720 Drug-induced myopathy: Secondary | ICD-10-CM | POA: Diagnosis not present

## 2022-11-30 ENCOUNTER — Other Ambulatory Visit: Payer: Self-pay | Admitting: Family Medicine

## 2022-11-30 ENCOUNTER — Ambulatory Visit
Admission: RE | Admit: 2022-11-30 | Discharge: 2022-11-30 | Disposition: A | Discharge status: Home / Self Care | Payer: Medicare PPO | Source: Ambulatory Visit | Attending: Family Medicine | Admitting: Family Medicine

## 2022-11-30 ENCOUNTER — Inpatient Hospital Stay: Payer: Medicare PPO | Attending: Hematology & Oncology

## 2022-11-30 ENCOUNTER — Telehealth: Payer: Self-pay | Admitting: Hematology & Oncology

## 2022-11-30 ENCOUNTER — Encounter: Payer: Self-pay | Admitting: Hematology & Oncology

## 2022-11-30 ENCOUNTER — Inpatient Hospital Stay: Payer: Medicare PPO | Admitting: Hematology & Oncology

## 2022-11-30 ENCOUNTER — Other Ambulatory Visit: Payer: Self-pay

## 2022-11-30 VITALS — BP 130/47 | HR 61 | Temp 97.3°F | Resp 18 | Wt 158.0 lb

## 2022-11-30 DIAGNOSIS — C185 Malignant neoplasm of splenic flexure: Secondary | ICD-10-CM

## 2022-11-30 DIAGNOSIS — M79602 Pain in left arm: Secondary | ICD-10-CM

## 2022-11-30 DIAGNOSIS — M19011 Primary osteoarthritis, right shoulder: Secondary | ICD-10-CM | POA: Diagnosis not present

## 2022-11-30 DIAGNOSIS — E119 Type 2 diabetes mellitus without complications: Secondary | ICD-10-CM | POA: Diagnosis not present

## 2022-11-30 DIAGNOSIS — Z9221 Personal history of antineoplastic chemotherapy: Secondary | ICD-10-CM | POA: Insufficient documentation

## 2022-11-30 DIAGNOSIS — D649 Anemia, unspecified: Secondary | ICD-10-CM | POA: Diagnosis not present

## 2022-11-30 DIAGNOSIS — Z85038 Personal history of other malignant neoplasm of large intestine: Secondary | ICD-10-CM | POA: Insufficient documentation

## 2022-11-30 DIAGNOSIS — C183 Malignant neoplasm of hepatic flexure: Secondary | ICD-10-CM

## 2022-11-30 DIAGNOSIS — M19012 Primary osteoarthritis, left shoulder: Secondary | ICD-10-CM | POA: Diagnosis not present

## 2022-11-30 LAB — CMP (CANCER CENTER ONLY)
ALT: 25 U/L (ref 0–44)
AST: 19 U/L (ref 15–41)
Albumin: 4.7 g/dL (ref 3.5–5.0)
Alkaline Phosphatase: 38 U/L (ref 38–126)
Anion gap: 12 (ref 5–15)
BUN: 30 mg/dL — ABNORMAL HIGH (ref 8–23)
CO2: 27 mmol/L (ref 22–32)
Calcium: 10.6 mg/dL — ABNORMAL HIGH (ref 8.9–10.3)
Chloride: 98 mmol/L (ref 98–111)
Creatinine: 1.37 mg/dL — ABNORMAL HIGH (ref 0.44–1.00)
GFR, Estimated: 39 mL/min — ABNORMAL LOW (ref >60)
Glucose, Bld: 128 mg/dL — ABNORMAL HIGH (ref 70–99)
Potassium: 3.9 mmol/L (ref 3.5–5.1)
Sodium: 137 mmol/L (ref 135–145)
Total Bilirubin: 0.4 mg/dL (ref 0.3–1.2)
Total Protein: 7.2 g/dL (ref 6.5–8.1)

## 2022-11-30 LAB — CBC WITH DIFFERENTIAL (CANCER CENTER ONLY)
Abs Immature Granulocytes: 0.02 10*3/uL (ref 0.00–0.07)
Basophils Absolute: 0 10*3/uL (ref 0.0–0.1)
Basophils Relative: 1 %
Eosinophils Absolute: 0.4 10*3/uL (ref 0.0–0.5)
Eosinophils Relative: 7 %
HCT: 32.7 % — ABNORMAL LOW (ref 36.0–46.0)
Hemoglobin: 10.9 g/dL — ABNORMAL LOW (ref 12.0–15.0)
Immature Granulocytes: 0 %
Lymphocytes Relative: 33 %
Lymphs Abs: 2 10*3/uL (ref 0.7–4.0)
MCH: 29.9 pg (ref 26.0–34.0)
MCHC: 33.3 g/dL (ref 30.0–36.0)
MCV: 89.8 fL (ref 80.0–100.0)
Monocytes Absolute: 0.5 10*3/uL (ref 0.1–1.0)
Monocytes Relative: 9 %
Neutro Abs: 3.2 10*3/uL (ref 1.7–7.7)
Neutrophils Relative %: 50 %
Platelet Count: 180 10*3/uL (ref 150–400)
RBC: 3.64 MIL/uL — ABNORMAL LOW (ref 3.87–5.11)
RDW: 12 % (ref 11.5–15.5)
WBC Count: 6.3 10*3/uL (ref 4.0–10.5)
nRBC: 0 % (ref 0.0–0.2)

## 2022-11-30 LAB — RETICULOCYTES
Immature Retic Fract: 11.2 % (ref 2.3–15.9)
RBC.: 3.6 MIL/uL — ABNORMAL LOW (ref 3.87–5.11)
Retic Count, Absolute: 58 10*3/uL (ref 19.0–186.0)
Retic Ct Pct: 1.6 % (ref 0.4–3.1)

## 2022-11-30 LAB — CEA (IN HOUSE-CHCC): CEA (CHCC-In House): 2.29 ng/mL (ref 0.00–5.00)

## 2022-11-30 LAB — IRON AND IRON BINDING CAPACITY (CC-WL,HP ONLY)
Iron: 58 ug/dL (ref 28–170)
Saturation Ratios: 15 % (ref 10.4–31.8)
TIBC: 391 ug/dL (ref 250–450)
UIBC: 333 ug/dL (ref 148–442)

## 2022-11-30 LAB — FERRITIN: Ferritin: 54 ng/mL (ref 11–307)

## 2022-11-30 NOTE — Progress Notes (Signed)
Hematology and Oncology Follow Up Visit  Robin [name] GZ:[id number] 1941-01-08 82 y.o. 11/30/2022   Principle Diagnosis:  History of locally recurrent adenocarcinoma of the colon  Current Therapy:   Observation    Interim History:  [name] is here today for follow-up.  So far, she is doing pretty well.  We saw her 6 months ago.  She has had no problems since we last saw her..  She has had no problems with cough or shortness of breath.  There is been no nausea or vomiting.  She has had no change in bowel or bladder habits.  She has little bit of diarrhea.  She does see Gastroenterology in a week or so.  She is anemic.  When we last saw her, her ferritin was 10 was 49 with an iron saturation of 23%.  Of note, she also had a low erythropoietin level of only 8.2.  She is not really symptomatic with the anemia.  As such, we will just watch for right now..  She does have some mild renal insufficiency.  She has had no rashes.  There is been no leg swelling.  She has had no issues with COVID.  Overall, I would say that her performance status is probably ECOG 1.     Medications:  Allergies as of 11/30/2022       Reactions   Ace Inhibitors Other (See Comments)   cough   Atorvastatin Other (See Comments)   Beef (diagnostic) Other (See Comments)   Celecoxib Other (See Comments)   Rosuvastatin Other (See Comments)   Statins    Aches in her bone crestor and lipitor        Medication List        Accurate as of November 30, 2022  9:30 AM. If you have any questions, ask your nurse or doctor.          Accu-Chek Guide Me w/Device Kit Use as instructed to check sugar once daily   Accu-Chek Softclix Lancets lancets Use as instructed to check sugar once daily   amLODipine 10 MG tablet Commonly known as: NORVASC Take 10 mg by mouth Daily.   aspirin 81 MG tablet Take 81 mg by mouth daily.   beta carotene w/minerals tablet Take 1 tablet by mouth daily.   budesonide 3 MG 24 hr  capsule Commonly known as: ENTOCORT EC Take by mouth daily as needed.   EPINEPHrine 0.3 mg/0.3 mL Soaj injection Commonly known as: EPI-PEN See admin instructions.   ezetimibe 10 MG tablet Commonly known as: ZETIA daily.   famotidine 40 MG tablet Commonly known as: PEPCID 1 tablet at bedtime.   fexofenadine 180 MG tablet Commonly known as: ALLEGRA Take 1 tablet (180 mg total) by mouth 2 (two) times daily.   Fluocinolone Acetonide Body 0.01 % Oil APPLY OIL TOPICALLY TO AFFECTED AREA TWICE DAILY AS NEEDED (APPLY SPARINGLY)   FLUoxetine 20 MG capsule Commonly known as: PROZAC Take 20 mg by mouth Daily.   fluticasone 50 MCG/ACT nasal spray Commonly known as: FLONASE Place 1 spray into both nostrils daily.   Glucosamine HCl 1500 MG Tabs Take 1,500 mg by mouth daily.   levothyroxine 112 MCG tablet Commonly known as: SYNTHROID Take 112 mcg by mouth daily before breakfast.   Magnesium 400 MG Tabs daily.   metFORMIN 500 MG 24 hr tablet Commonly known as: GLUCOPHAGE-XR TAKE 2 TABLETS BY MOUTH IN THE MORNING AND 1 IN THE EVENING   multivitamin capsule Take 1 capsule by  mouth daily.   OneTouch Verio test strip Generic drug: glucose blood USE TO CHECK YOU BLOOD SUGAR ONCE OR TWICE DAILY   polyvinyl alcohol 1.4 % ophthalmic solution Commonly known as: LIQUIFILM TEARS Place 1 drop into both eyes as needed for dry eyes.   Probiotic Daily Caps Take 1 capsule by mouth every morning.   triamterene-hydrochlorothiazide 37.5-25 MG capsule Commonly known as: DYAZIDE Take 1 capsule by mouth Daily.   vitamin B-12 100 MCG tablet Commonly known as: CYANOCOBALAMIN daily.   Vitamin C 500 MG Caps daily.   VITAMIN D-3 PO Take 2,000 Units by mouth daily.   Zinc 30 MG Caps daily.        Allergies:  Allergies  Allergen Reactions   Ace Inhibitors Other (See Comments)    cough   Atorvastatin Other (See Comments)   Beef (Diagnostic) Other (See Comments)   Celecoxib  Other (See Comments)   Rosuvastatin Other (See Comments)   Statins     Aches in her bone crestor and lipitor    Past Medical History, Surgical history, Social history, and Family History were reviewed and updated.  Review of Systems: Review of Systems  Constitutional: Negative.   HENT: Negative.    Eyes: Negative.   Respiratory: Negative.    Cardiovascular: Negative.   Gastrointestinal: Negative.   Genitourinary: Negative.   Musculoskeletal: Negative.   Skin: Negative.   Endo/Heme/Allergies: Negative.   Psychiatric/Behavioral: Negative.      Physical Exam:  weight is 158 lb (71.7 kg). Her oral temperature is 97.3 F (36.3 C) (abnormal). Her blood pressure is 130/47 (abnormal) and her pulse is 61. Her respiration is 18 and oxygen saturation is 100%.   Wt Readings from Last 3 Encounters:  11/30/22 158 lb (71.7 kg)  10/30/22 152 lb 9.6 oz (69.2 kg)  06/01/22 148 lb 6.4 oz (67.3 kg)    Physical Exam Vitals reviewed.  HENT:     Head: Normocephalic and atraumatic.  Eyes:     Pupils: Pupils are equal, round, and reactive to light.  Cardiovascular:     Rate and Rhythm: Normal rate and regular rhythm.     Heart sounds: Normal heart sounds.  Pulmonary:     Effort: Pulmonary effort is normal.     Breath sounds: Normal breath sounds.  Abdominal:     General: Bowel sounds are normal.     Palpations: Abdomen is soft.  Musculoskeletal:        General: No tenderness or deformity. Normal range of motion.     Cervical back: Normal range of motion.  Lymphadenopathy:     Cervical: No cervical adenopathy.  Skin:    General: Skin is warm and dry.     Findings: No erythema or rash.  Neurological:     Mental Status: She is alert and oriented to person, place, and time.  Psychiatric:        Behavior: Behavior normal.        Thought Content: Thought content normal.        Judgment: Judgment normal.     Lab Results  Component Value Date   WBC 6.3 11/30/2022   HGB 10.9 (L)  11/30/2022   HCT 32.7 (L) 11/30/2022   MCV 89.8 11/30/2022   PLT 180 11/30/2022   Lab Results  Component Value Date   FERRITIN 49 06/01/2022   IRON 93 06/01/2022   TIBC 399 06/01/2022   UIBC 306 06/01/2022   IRONPCTSAT 23 06/01/2022   Lab Results  Component Value  Date   RETICCTPCT 1.6 11/30/2022   RBC 3.60 (L) 11/30/2022   RBC 3.64 (L) 11/30/2022   RETICCTABS 48.7 12/17/2008   No results found for: "KPAFRELGTCHN", "LAMBDASER", "KAPLAMBRATIO" No results found for: "IGGSERUM", "IGA", "IGMSERUM" No results found for: "TOTALPROTELP", "ALBUMINELP", "A1GS", "A2GS", "BETS", "BETA2SER", "GAMS", "MSPIKE", "SPEI"   Chemistry      Component Value Date/Time   NA 138 06/01/2022 1011   NA 142 03/22/2018 1059   NA 139 05/21/2017 0838   K 3.9 06/01/2022 1011   K 4.3 05/21/2017 0838   CL 100 06/01/2022 1011   CL 102 05/18/2016 1505   CL 100 11/01/2015 0932   CO2 28 06/01/2022 1011   CO2 28 05/21/2017 0838   BUN 24 (H) 06/01/2022 1011   BUN 13 03/22/2018 1059   BUN 20.8 05/21/2017 0838   CREATININE 1.26 (H) 06/01/2022 1011   CREATININE 1.0 05/21/2017 0838      Component Value Date/Time   CALCIUM 11.3 (H) 06/01/2022 1011   CALCIUM 10.5 (H) 05/21/2017 0838   ALKPHOS 39 06/01/2022 1011   ALKPHOS 49 05/21/2017 0838   AST 21 06/01/2022 1011   AST 22 05/21/2017 0838   ALT 26 06/01/2022 1011   ALT 33 05/21/2017 0838   BILITOT 0.7 06/01/2022 1011   BILITOT 0.57 05/21/2017 0838      Impression and Plan: Ms. Rexford is a very pleasant 82 yo caucasian female with history of locally recurrent adenocarcinoma of the colon with resection in 2000. She received adjuvant FOLFOX.  I see no evidence of recurrent disease.  She has mild anemia.  Will have to monitor this closely.  Again, cognizant of the fact that she does have a low erythropoietin level.  She is diabetic so this might be part of the etiology.  For right now, we will plan on this to get her back in 6 months.    [name],  MD 3/7/20249:30 AM

## 2022-11-30 NOTE — Telephone Encounter (Signed)
Contacted pt to schedule an appt per 11/30/22 LOS. Unable to reach via phone, voicemail was left.     Follow-up disposition: Return in about 6 months (around 06/02/2023) for md labs, 53mn appt.

## 2022-12-01 ENCOUNTER — Encounter: Payer: Self-pay | Admitting: *Deleted

## 2022-12-01 ENCOUNTER — Other Ambulatory Visit: Payer: Self-pay | Admitting: Pharmacist

## 2022-12-01 DIAGNOSIS — D508 Other iron deficiency anemias: Secondary | ICD-10-CM

## 2022-12-01 DIAGNOSIS — D509 Iron deficiency anemia, unspecified: Secondary | ICD-10-CM | POA: Insufficient documentation

## 2022-12-01 NOTE — Progress Notes (Signed)
Venofer 200 mg x 3 doses added per Dr. Antonieta Pert instructions.

## 2022-12-05 ENCOUNTER — Telehealth: Payer: Self-pay | Admitting: *Deleted

## 2022-12-05 NOTE — Progress Notes (Unsigned)
Cardiology Clinic Note   Date: 12/06/2022 ID: Robin [name], DOB 27-Jan-1941, MRN [id number]  Primary Cardiologist:  Peter Martinique, MD  Patient Profile    Robin [name] is a 82 y.o. female who presents to the clinic today for evaluation of shortness of breath.  Past medical history significant for: Nonobstructive CAD. Coronary CTA 09/12/2021: Calcium score 342. Ostial RCA moderate 50-69% mixed circumferential stenosis. Proximal LM minimal mixed 1-24%. Proximal LAD minimal mixed 1-24%.  Proximal LCx minimal to mild 24-49%.  FFR did not show significant stenosis.  No extracardiac findings. Recommend medical therapy.  Hypertension. Hyperlipidemia. Lipid panel 11/23/2022: LDL 104, HDL 45, TG 311, total 201. T2DM. OSA. Colon CA. CKD stage III. Iron deficiency anemia. CBC 11/30/2022: Hemoglobin 10.9, HCT 32.7   History of Present Illness    Robin [name] was first evaluated by Dr. Martinique on 08/30/2021 for chest pain and shortness of breath at the request of Faustino Congress, NP.  Patient reported a strong family history of CAD.  Symptoms concerning for angina.  Coronary CTA showed moderate ostial mixed stenosis of the RCA found not significant by FFR.  Patient last seen in the office by Dr. Martinique on 10/21/2021 for follow-up of CTA.  She reported improvement in chest tightness on omeprazole.  Today, patient is accompanied by her daughter.  She reports for the last month she has experienced positional dizziness and dizziness when standing for too long. She cannot quantify how long is too long. She reports dizziness upon standing will resolve after a minute and then she is able to go about her activities.  The dizziness she experiences when she stands too long will resolve after sitting for few minutes to rest.  She has not started any new medications.  She was found to be anemic with low iron levels and is being set up to undergo iron infusions x 3 per Dr. Marin Olp.  Patient denies shortness of  breath at rest.  She reports she can become slightly winded and fatigued with heavy exertion for the last month. No chest pain, pressure, or tightness. Denies lower extremity edema, orthopnea, or PND. Occasional palpitations that she has experienced for years and are unchanged. Orthostatics performed today.    ROS: All other systems reviewed and are otherwise negative except as noted in History of Present Illness.  Studies Reviewed    ECG personally reviewed by me today: NSR, 61 bpm, nonspecific ST and T wave abnormality.  No significant changes from 08/30/2021.  No ectopy today.   Physical Exam    VS:  BP (!) 118/58   Pulse 61   Ht '5\' 2"'$  (1.575 m)   Wt 150 lb 9.6 oz (68.3 kg)   SpO2 96%   BMI 27.55 kg/m  , BMI Body mass index is 27.55 kg/m.  Orthostatic VS for the past 24 hrs (Last 3 readings):  BP- Lying Pulse- Lying BP- Sitting Pulse- Sitting BP- Standing at 0 minutes Pulse- Standing at 0 minutes BP- Standing at 3 minutes Pulse- Standing at 3 minutes  12/06/22 1040 133/68 53 124/62 58 113/62 61 124/69 62     GEN: Well nourished, well developed, in no acute distress. Neck: No JVD or carotid bruits. Cardiac: RRR. No murmurs. No rubs or gallops.   Respiratory:  Respirations regular and unlabored. Clear to auscultation without rales, wheezing or rhonchi. GI: Soft, nontender, nondistended. Extremities: Radials/DP/PT 2+ and equal bilaterally. No clubbing or cyanosis. No edema.  Skin: Warm and dry, no rash. Neuro: Strength  intact.  Assessment & Plan   Dizziness. Patient reports a one month history of positional dizziness. She also gets dizzy when she stands for too long (cannot quantify time period that causes dizziness). Patient resolution of symptoms with pausing for a moment or sitting she is able to resume her activities.  She reports mild DOE with heavy exertion but no shortness of breath with regular activities.  She reports occasional palpitations that are unchanged for years.  Orthostatics vitals performed today did not reflect orthostatic hypotension.  Patient reported mild dizziness with laying flat on her back but no dizziness with position changes standing for 3 minutes.  Orthostatic vitals listed under physical exam.  Patient was recently told that she has iron deficiency anemia and will need to undergo iron infusions (see #5).  I suspect her positional dizziness is related to her anemia.  Patient and daughter in agreement for patient to undergo iron infusions and see if symptoms improve.  She is instructed to contact the office if after 3 iron infusions her symptoms are not resolved. Nonobstructive CAD.  Coronary CTA December 2022 showed moderate stenosis ostial RCA not significant per FFR.  Patient denies chest pain, pressure, tightness.  She stays active by walking 1 mile 2-3 times a week.  She reports she will increase this even further with it staying light longer and the weather warming up.  Continue aspirin, amlodipine, Zetia. Hypertension.  BP today 118/58.  No orthostatic hypotension (see #1).  Patient denies headaches or changes.  Continue amlodipine. Hyperlipidemia.  LDL February 2024 104.  Patient is intolerant to statins.  Continue Zetia.  Followed by PCP. Iron deficiency anemia.  CBC March 2024 with hemoglobin 10.9.  Patient followed by Dr. Marin Olp, heme-onc.  Patient being set up for Venofer x 3 doses.  Disposition: Patient to contact office if positional dizziness does not resolve after iron infusions.  Return in 1 year or sooner as needed.         Signed, Justice Britain. Yarelie Hams, DNP, NP-C

## 2022-12-05 NOTE — Telephone Encounter (Signed)
Per scheduling message Lorriane Shire (3) doses of IV Iron - patient is requesting infusions @ [facility] - reached out to Hinda Lenis for scheduling.

## 2022-12-06 ENCOUNTER — Ambulatory Visit: Payer: Medicare PPO | Attending: Student | Admitting: Student

## 2022-12-06 ENCOUNTER — Encounter: Payer: Self-pay | Admitting: Student

## 2022-12-06 VITALS — BP 118/58 | HR 61 | Ht 62.0 in | Wt 150.6 lb

## 2022-12-06 DIAGNOSIS — E1169 Type 2 diabetes mellitus with other specified complication: Secondary | ICD-10-CM | POA: Diagnosis not present

## 2022-12-06 DIAGNOSIS — D509 Iron deficiency anemia, unspecified: Secondary | ICD-10-CM

## 2022-12-06 DIAGNOSIS — R42 Dizziness and giddiness: Secondary | ICD-10-CM

## 2022-12-06 DIAGNOSIS — I10 Essential (primary) hypertension: Secondary | ICD-10-CM | POA: Diagnosis not present

## 2022-12-06 DIAGNOSIS — E785 Hyperlipidemia, unspecified: Secondary | ICD-10-CM

## 2022-12-06 DIAGNOSIS — I2510 Atherosclerotic heart disease of native coronary artery without angina pectoris: Secondary | ICD-10-CM

## 2022-12-06 NOTE — Patient Instructions (Signed)
Medication Instructions:  Your physician recommends that you continue on your current medications as directed. Please refer to the Current Medication list given to you today.  *If you need a refill on your cardiac medications before your next appointment, please call your pharmacy*   Lab Work: NONE If you have labs (blood work) drawn today and your tests are completely normal, you will receive your results only by: [place] (if you have MyChart) OR A paper copy in the mail If you have any lab test that is abnormal or we need to change your treatment, we will call you to review the results.   Testing/Procedures: NONE   Follow-Up: At [facility], you and your health needs are our priority.  As part of our continuing mission to provide you with exceptional heart care, we have created designated Provider Care Teams.  These Care Teams include your primary Cardiologist (physician) and Advanced Practice Providers (APPs -  Physician Assistants and Nurse Practitioners) who all work together to provide you with the care you need, when you need it.  We recommend signing up for the patient portal called "MyChart".  Sign up information is provided on this After Visit Summary.  MyChart is used to connect with patients for Virtual Visits (Telemedicine).  Patients are able to view lab/test results, encounter notes, upcoming appointments, etc.  Non-urgent messages can be sent to your provider as well.   To learn more about what you can do with MyChart, go to [email or web address].    Your next appointment:   1 year(s)  Provider:   Peter Martinique, MD

## 2022-12-07 ENCOUNTER — Telehealth: Payer: Self-pay | Admitting: *Deleted

## 2022-12-07 NOTE — Telephone Encounter (Signed)
Sent in basket scheduling message and called and lvm for Mo @ [facility] to schedule (3) doses of IV Iron.

## 2022-12-11 ENCOUNTER — Inpatient Hospital Stay: Payer: Medicare PPO

## 2022-12-11 VITALS — BP 136/55 | HR 55 | Temp 98.2°F | Resp 18

## 2022-12-11 DIAGNOSIS — D649 Anemia, unspecified: Secondary | ICD-10-CM | POA: Diagnosis not present

## 2022-12-11 DIAGNOSIS — E119 Type 2 diabetes mellitus without complications: Secondary | ICD-10-CM | POA: Diagnosis not present

## 2022-12-11 DIAGNOSIS — H35033 Hypertensive retinopathy, bilateral: Secondary | ICD-10-CM | POA: Diagnosis not present

## 2022-12-11 DIAGNOSIS — H04123 Dry eye syndrome of bilateral lacrimal glands: Secondary | ICD-10-CM | POA: Diagnosis not present

## 2022-12-11 DIAGNOSIS — Z9221 Personal history of antineoplastic chemotherapy: Secondary | ICD-10-CM | POA: Diagnosis not present

## 2022-12-11 DIAGNOSIS — D508 Other iron deficiency anemias: Secondary | ICD-10-CM

## 2022-12-11 DIAGNOSIS — Z85038 Personal history of other malignant neoplasm of large intestine: Secondary | ICD-10-CM | POA: Diagnosis not present

## 2022-12-11 DIAGNOSIS — H40013 Open angle with borderline findings, low risk, bilateral: Secondary | ICD-10-CM | POA: Diagnosis not present

## 2022-12-11 MED ORDER — SODIUM CHLORIDE 0.9 % IV SOLN
200.0000 mg | Freq: Once | INTRAVENOUS | Status: AC
  Administered 2022-12-11: 200 mg via INTRAVENOUS
  Filled 2022-12-11: qty 200

## 2022-12-11 MED ORDER — SODIUM CHLORIDE 0.9 % IV SOLN: Freq: Once | INTRAVENOUS | Status: AC

## 2022-12-11 NOTE — Patient Instructions (Signed)

## 2022-12-12 DIAGNOSIS — Z85038 Personal history of other malignant neoplasm of large intestine: Secondary | ICD-10-CM | POA: Diagnosis not present

## 2022-12-12 DIAGNOSIS — K5289 Other specified noninfective gastroenteritis and colitis: Secondary | ICD-10-CM | POA: Diagnosis not present

## 2022-12-15 ENCOUNTER — Other Ambulatory Visit: Payer: Self-pay | Admitting: Family Medicine

## 2022-12-15 DIAGNOSIS — Z1231 Encounter for screening mammogram for malignant neoplasm of breast: Secondary | ICD-10-CM

## 2022-12-18 ENCOUNTER — Inpatient Hospital Stay: Payer: Medicare PPO

## 2022-12-18 VITALS — BP 126/46 | HR 56 | Temp 98.2°F | Resp 18

## 2022-12-18 DIAGNOSIS — Z85038 Personal history of other malignant neoplasm of large intestine: Secondary | ICD-10-CM | POA: Diagnosis not present

## 2022-12-18 DIAGNOSIS — D508 Other iron deficiency anemias: Secondary | ICD-10-CM

## 2022-12-18 DIAGNOSIS — D649 Anemia, unspecified: Secondary | ICD-10-CM | POA: Diagnosis not present

## 2022-12-18 DIAGNOSIS — Z9221 Personal history of antineoplastic chemotherapy: Secondary | ICD-10-CM | POA: Diagnosis not present

## 2022-12-18 DIAGNOSIS — E119 Type 2 diabetes mellitus without complications: Secondary | ICD-10-CM | POA: Diagnosis not present

## 2022-12-18 MED ORDER — SODIUM CHLORIDE 0.9 % IV SOLN
200.0000 mg | Freq: Once | INTRAVENOUS | Status: AC
  Administered 2022-12-18: 200 mg via INTRAVENOUS
  Filled 2022-12-18: qty 200

## 2022-12-18 MED ORDER — SODIUM CHLORIDE 0.9 % IV SOLN: Freq: Once | INTRAVENOUS | Status: AC

## 2022-12-18 NOTE — Patient Instructions (Signed)

## 2022-12-21 ENCOUNTER — Ambulatory Visit: Payer: Medicare PPO | Admitting: Student

## 2022-12-25 ENCOUNTER — Inpatient Hospital Stay: Payer: Medicare PPO | Attending: Hematology & Oncology

## 2022-12-25 VITALS — BP 149/62 | HR 51 | Temp 98.2°F | Resp 18 | Ht 62.0 in | Wt 149.2 lb

## 2022-12-25 DIAGNOSIS — D508 Other iron deficiency anemias: Secondary | ICD-10-CM

## 2022-12-25 DIAGNOSIS — D649 Anemia, unspecified: Secondary | ICD-10-CM | POA: Diagnosis not present

## 2022-12-25 DIAGNOSIS — E119 Type 2 diabetes mellitus without complications: Secondary | ICD-10-CM | POA: Diagnosis not present

## 2022-12-25 DIAGNOSIS — Z85038 Personal history of other malignant neoplasm of large intestine: Secondary | ICD-10-CM | POA: Insufficient documentation

## 2022-12-25 DIAGNOSIS — Z9221 Personal history of antineoplastic chemotherapy: Secondary | ICD-10-CM | POA: Insufficient documentation

## 2022-12-25 MED ORDER — SODIUM CHLORIDE 0.9 % IV SOLN: Freq: Once | INTRAVENOUS | Status: AC

## 2022-12-25 MED ORDER — SODIUM CHLORIDE 0.9 % IV SOLN
200.0000 mg | Freq: Once | INTRAVENOUS | Status: AC
  Administered 2022-12-25: 200 mg via INTRAVENOUS
  Filled 2022-12-25: qty 200

## 2022-12-25 NOTE — Patient Instructions (Signed)

## 2023-01-04 DIAGNOSIS — D225 Melanocytic nevi of trunk: Secondary | ICD-10-CM | POA: Diagnosis not present

## 2023-01-04 DIAGNOSIS — Z85828 Personal history of other malignant neoplasm of skin: Secondary | ICD-10-CM | POA: Diagnosis not present

## 2023-01-04 DIAGNOSIS — L821 Other seborrheic keratosis: Secondary | ICD-10-CM | POA: Diagnosis not present

## 2023-01-04 DIAGNOSIS — L723 Sebaceous cyst: Secondary | ICD-10-CM | POA: Diagnosis not present

## 2023-01-04 DIAGNOSIS — D2372 Other benign neoplasm of skin of left lower limb, including hip: Secondary | ICD-10-CM | POA: Diagnosis not present

## 2023-01-04 DIAGNOSIS — L910 Hypertrophic scar: Secondary | ICD-10-CM | POA: Diagnosis not present

## 2023-01-04 DIAGNOSIS — L570 Actinic keratosis: Secondary | ICD-10-CM | POA: Diagnosis not present

## 2023-01-04 DIAGNOSIS — Z86018 Personal history of other benign neoplasm: Secondary | ICD-10-CM | POA: Diagnosis not present

## 2023-01-04 DIAGNOSIS — L578 Other skin changes due to chronic exposure to nonionizing radiation: Secondary | ICD-10-CM | POA: Diagnosis not present

## 2023-01-17 DIAGNOSIS — D125 Benign neoplasm of sigmoid colon: Secondary | ICD-10-CM | POA: Diagnosis not present

## 2023-01-17 DIAGNOSIS — D123 Benign neoplasm of transverse colon: Secondary | ICD-10-CM | POA: Diagnosis not present

## 2023-01-17 DIAGNOSIS — Z980 Intestinal bypass and anastomosis status: Secondary | ICD-10-CM | POA: Diagnosis not present

## 2023-01-17 DIAGNOSIS — K648 Other hemorrhoids: Secondary | ICD-10-CM | POA: Diagnosis not present

## 2023-01-17 DIAGNOSIS — Z85038 Personal history of other malignant neoplasm of large intestine: Secondary | ICD-10-CM | POA: Diagnosis not present

## 2023-01-19 DIAGNOSIS — D125 Benign neoplasm of sigmoid colon: Secondary | ICD-10-CM | POA: Diagnosis not present

## 2023-01-19 DIAGNOSIS — D123 Benign neoplasm of transverse colon: Secondary | ICD-10-CM | POA: Diagnosis not present

## 2023-01-23 DIAGNOSIS — M542 Cervicalgia: Secondary | ICD-10-CM | POA: Diagnosis not present

## 2023-01-23 DIAGNOSIS — M25511 Pain in right shoulder: Secondary | ICD-10-CM | POA: Diagnosis not present

## 2023-01-23 DIAGNOSIS — M25512 Pain in left shoulder: Secondary | ICD-10-CM | POA: Diagnosis not present

## 2023-01-31 ENCOUNTER — Ambulatory Visit
Admission: RE | Admit: 2023-01-31 | Discharge: 2023-01-31 | Disposition: A | Discharge status: Home / Self Care | Payer: Medicare PPO | Source: Ambulatory Visit | Attending: Family Medicine | Admitting: Family Medicine

## 2023-01-31 DIAGNOSIS — Z1231 Encounter for screening mammogram for malignant neoplasm of breast: Secondary | ICD-10-CM | POA: Diagnosis not present

## 2023-02-05 ENCOUNTER — Telehealth: Payer: Self-pay

## 2023-02-05 NOTE — Telephone Encounter (Signed)
Lvm for pt to call back with questions.

## 2023-02-05 NOTE — Telephone Encounter (Signed)
Pt has question regarding medication

## 2023-03-13 ENCOUNTER — Other Ambulatory Visit: Payer: Self-pay | Admitting: Internal Medicine

## 2023-03-13 DIAGNOSIS — E1121 Type 2 diabetes mellitus with diabetic nephropathy: Secondary | ICD-10-CM

## 2023-05-08 ENCOUNTER — Ambulatory Visit: Payer: Medicare PPO | Admitting: Internal Medicine

## 2023-05-08 ENCOUNTER — Encounter: Payer: Self-pay | Admitting: Internal Medicine

## 2023-05-08 VITALS — BP 146/78 | HR 41 | Ht 62.0 in | Wt 149.8 lb

## 2023-05-08 DIAGNOSIS — E119 Type 2 diabetes mellitus without complications: Secondary | ICD-10-CM

## 2023-05-08 DIAGNOSIS — E038 Other specified hypothyroidism: Secondary | ICD-10-CM

## 2023-05-08 DIAGNOSIS — Z7984 Long term (current) use of oral hypoglycemic drugs: Secondary | ICD-10-CM | POA: Diagnosis not present

## 2023-05-08 DIAGNOSIS — E785 Hyperlipidemia, unspecified: Secondary | ICD-10-CM

## 2023-05-08 DIAGNOSIS — E1121 Type 2 diabetes mellitus with diabetic nephropathy: Secondary | ICD-10-CM

## 2023-05-08 DIAGNOSIS — E1169 Type 2 diabetes mellitus with other specified complication: Secondary | ICD-10-CM

## 2023-05-08 DIAGNOSIS — E063 Autoimmune thyroiditis: Secondary | ICD-10-CM

## 2023-05-08 LAB — POCT GLYCOSYLATED HEMOGLOBIN (HGB A1C): Hemoglobin A1C: 6.4 % — AB (ref 4.0–5.6)

## 2023-05-08 MED ORDER — METFORMIN HCL ER 500 MG PO TB24
ORAL_TABLET | ORAL | 3 refills | Status: DC
Start: 2023-05-08 — End: 2024-01-24

## 2023-05-08 NOTE — Patient Instructions (Addendum)
Please continue Metformin ER 1000 mg in am and 500 mg with dinner.  Check sugars 1x a day, rotating check times and write them down.  Continue Levothyroxine 112 mcg daily.  Take the thyroid hormone every day, with water, at least 30 minutes before breakfast, separated by at least 4 hours from: - acid reflux medications - calcium - iron - multivitamins  Please return in 6 months with your sugar log.

## 2023-05-08 NOTE — Addendum Note (Signed)
Addended by: Pollie Meyer on: 05/08/2023 09:00 AM   Modules accepted: Orders

## 2023-05-08 NOTE — Progress Notes (Signed)
Patient ID: Robin [name], female   DOB: [birth year], 82 y.o.   MRN: [id number]  HPI: Robin [name] is a 82 y.o.-year-old female, returning for f/u for DM2, initially GDM, then DM2 dx in 05/2015, non-insulin-dependent, controlled, with complications (mild CKD, diabetic retinopathy). Last visit 6 months ago.  Interim history: No increased urination, blurry vision, nausea, chest pain.  Reviewed HbA1c levels: Lab Results  Component Value Date   HGBA1C 6.6 (A) 10/30/2022   HGBA1C 6.6 (A) 04/27/2022   HGBA1C 6.8 (A) 12/22/2021  07/20/2020: HbA1c 6.6%  Pt is on a regimen of: - Metformin ER 1000 mg with dinner -she had diarrhea with higher doses >> 500 mg twice a day >> 1000 mg in am and 500 mg with dinner She did not start Januvia - $.  Pt checks her sugars 1-2x a day: - am:  123 >> 103 >> 141, 168 >> 153 >> n/c >> 139 >> 121-145 - 2h after b'fast: n/c >> 74 >> 126 >> 203 >> n/c  - before lunch: 81, 89-129, 161, 196, 218 >> 81-163, 175, 188 >> 68, 75-123, 130 - 2h after lunch:  79, 86 >> 145 >> 115 >> n/c - before dinner:  85-139, 148, 208 >> 83-130, 147, 196 >> 80 >> 91-140, 161 - 2h after dinner: 120-130 >> n/c >> 110 >> 121 >> n/c - bedtime: 115, 116 >> 94  >> n/c >> 139 >> n/c - nighttime: n/c Lowest sugar was 58 >> ...64 >> 81 >> 68; she has hypoglycemia awareness in the 70s. Highest sugar was  208 >> 218 >> 196 >> 161.  Glucometer: One Touch  Pt's meals are: - Breakfast: cheerios + raisin bran or cornflakes + 2% milk or bacon + egg + toast - Lunch: salad or soup or sandwich - Dinner: veggies + starch - Snacks: no desserts; apple, chips She saw a dietitian 07/09/2015-this helped a lot. In 2019 had a pruritic maculopapular generalized rash. She saw PCP and dermatology and no etiology was found. She then saw Dr. Myna Hidalgo - suspicion for mastocytosis  - BM Bx negative.  She found out she is allergic to meat.  She stopped eating meat and the rash resolved.  Of note, no alpha gal  allergy.  She was also investigated for gallstones.  + Mild CKD, last BUN/creatinine:  Lab Results  Component Value Date   BUN 30 (H) 11/30/2022   CREATININE 1.37 (H) 11/30/2022   Microalbumin Panel Reviewed date:11/27/2021 09:24:17 AM Interpretation:Abnormal Performing Lab: Notes/Report: Testing Performed at: [facility], [address], [address], [place], [place] [address]  MA/CR ratio 39.1 0.0-30.0 mg/G    UMA 1.37 0.00-1.90 mg/dL    UCR 35      62/13/0865: Glu 143, BUN/Cr 21/1.19, GFR 46, Calcium 69.6 07/20/2020 18/1.06, GFR 50 10/22/2018: ACR 11.8  + HL; last set of lipids:  11/21/2021: 207/275/47/113 11/08/2020: 206/363/40/104 07/20/2020: 261/310/49/155 Lab Results  Component Value Date   CHOL 210 (H) 02/12/2020   HDL 44.60 02/12/2020   LDLCALC 131 (H) 11/01/2015   LDLDIRECT 134.0 02/12/2020   TRIG 253.0 (H) 02/12/2020   CHOLHDL 5 02/12/2020  10/22/2018: 228/352/39/119 10/10/2016: 246/381/40/130   She is on Zetia 10 mg daily and fish oil 1000 mg twice a day. She tried statins >> Bone pain.  She tried Benecol spread but did not like it.  She also developed a rash from fenofibrate. I suggested a referral to nutrition but she did not have this appointment.   - last  eye exam was in 12/12/2022: No DR, previously + DR. She does have drusen and hypertensive retinopathy along with glaucoma.  - No numbness and tingling in her feet.  Last foot exam 12/22/2021.  She also has HTN, OSA, depression, osteoarthritis, spinal stenosis, osteopenia, GERD, CKD stage III. + h/o colon cancer.  Hashimoto's hypothyroidism:  Pt is on levothyroxine 112 mcg daily, taken: - in am - fasting - at least 30 min from b'fast - no calcium - no iron - + multivitamins  - 1h after LT4 >> moved 4 hours after LT4 - no PPIs - not on Biotin She takes Pepcid in the morning, separated by 1 to 2 hours from levothyroxine.  Reviewed latest TSH levels: 11/23/2022: TSH 0.64 11/21/2021: TSH 0.57  (0.34-4.5) Lab Results  Component Value Date   TSH 0.57 08/24/2021   TSH 0.84 02/12/2020   TSH 2.140 03/22/2018  07/20/2020: 2.22 10/22/2018: TSH 0.76  In 2019 she was found to have elevated ATA antibodies: Lab Results  Component Value Date   THGAB 93.8 (H) 03/22/2018   We checked a thyroid ultrasound since she has a prominent left thyroid lobe on palpation: Thyroid U/S (03/03/2019): no nodules, heterogeneity and atrophy  ROS: + see HPI  I reviewed pt's medications, allergies, PMH, social hx, family hx, and changes were documented in the history of present illness. Otherwise, unchanged from my initial visit note.  Past Medical History:  Diagnosis Date   Arthritis    Colon cancer (HCC)    colon ca dx 2008 and 2010   Complication of anesthesia    problems waking up-patient thinks she was given too much medication   Gestational diabetes    Headache    headaches all the time   Hyperlipidemia    Hypertension    Sleep apnea    uses CPAP   Urticaria    Past Surgical History:  Procedure Laterality Date   ABDOMINAL HYSTERECTOMY     APPENDECTOMY     BACK SURGERY  2009   CARPAL TUNNEL RELEASE     COLON SURGERY     COLONOSCOPY WITH PROPOFOL N/A 08/10/2015   Procedure: COLONOSCOPY WITH PROPOFOL;  Surgeon: Charolett Bumpers, MD;  Location: [facility];  Service: Endoscopy;  Laterality: N/A;   DILATION AND CURETTAGE OF UTERUS     had before had hysterectomy   ESOPHAGOGASTRODUODENOSCOPY (EGD) WITH PROPOFOL N/A 08/10/2015   Procedure: ESOPHAGOGASTRODUODENOSCOPY (EGD) WITH PROPOFOL;  Surgeon: Charolett Bumpers, MD;  Location: [facility];  Service: Endoscopy;  Laterality: N/A;   EYE SURGERY     bilateral cataract surgery    ROTATOR CUFF REPAIR     Social History   Socioeconomic History   Marital status: Divorced    Spouse name: Not on file   Number of children: 4   Years of education: 12+   Highest education level: Not on file  Occupational History   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   Caffeine use: Drinks coffee and tea (1 cup coffee per day). Tea rarely  Social Determinants of Health   Financial Resource Strain: Not on file  Food Insecurity: Not on file  Transportation Needs: Not on file  Physical Activity: Not on file  Stress: Not on file  Social Connections: Not on file  Intimate Partner Violence: Not on file   Current Outpatient Medications on File Prior to Visit  Medication Sig Dispense Refill   Accu-Chek Softclix Lancets lancets Use as instructed to check sugar once daily 100 each 3   amLODipine (NORVASC) 10 MG tablet Take 10 mg by mouth Daily.     Ascorbic Acid (VITAMIN C) 500 MG CAPS daily.     aspirin 81 MG tablet Take 81 mg by mouth daily.     beta carotene w/minerals (OCUVITE) tablet Take 1 tablet by mouth daily.     Blood Glucose Monitoring Suppl (ACCU-CHEK GUIDE ME) w/Device KIT Use as instructed to check sugar once daily 1 kit 0   budesonide (ENTOCORT EC) 3 MG 24 hr capsule Take by mouth daily as needed.     Cholecalciferol (VITAMIN D-3 PO) Take 2,000 Units by mouth daily.     EPINEPHrine 0.3 mg/0.3 mL IJ SOAJ injection See admin instructions.     ezetimibe (ZETIA) 10 MG tablet daily.     famotidine (PEPCID) 40 MG tablet 1 tablet at bedtime.     fexofenadine (ALLEGRA) 180 MG tablet Take 1 tablet (180 mg total) by mouth 2 (two) times daily. 60 tablet 3   Fluocinolone Acetonide Body 0.01 % OIL APPLY OIL TOPICALLY TO AFFECTED AREA TWICE DAILY AS NEEDED (APPLY SPARINGLY)     FLUoxetine (PROZAC) 20 MG capsule Take 20 mg by mouth Daily.     fluticasone (FLONASE)  50 MCG/ACT nasal spray Place 1 spray into both nostrils daily.     Glucosamine HCl 1500 MG TABS Take 1,500 mg by mouth daily.      glucose blood (ONETOUCH VERIO) test strip USE TO CHECK YOU BLOOD SUGAR ONCE OR TWICE DAILY 200 each 3   levothyroxine (SYNTHROID, LEVOTHROID) 112 MCG tablet Take 112 mcg by mouth daily before breakfast.      Magnesium 400 MG TABS daily.     metFORMIN (GLUCOPHAGE-XR) 500 MG 24 hr tablet TAKE 2 TABLETS BY MOUTH IN THE MORNING AND 1 TABLET IN THE EVENING 270 tablet 0   Multiple Vitamin (MULTIVITAMIN) capsule Take 1 capsule by mouth daily.     polyvinyl alcohol (LIQUIFILM TEARS) 1.4 % ophthalmic solution Place 1 drop into both eyes as needed for dry eyes.     Probiotic Product (PROBIOTIC DAILY) CAPS Take 1 capsule by mouth every morning.      triamterene-hydrochlorothiazide (DYAZIDE) 37.5-25 MG per capsule Take 1 capsule by mouth Daily.      vitamin B-12 (CYANOCOBALAMIN) 100 MCG tablet daily.     Zinc 30 MG CAPS daily.     No current facility-administered medications on file prior to visit.   Allergies  Allergen Reactions   Ace Inhibitors Other (See Comments)    cough   Atorvastatin Other (See Comments)   Beef (Diagnostic) Other (See Comments)   Celecoxib Other (See Comments)   Rosuvastatin Other (See Comments)   Statins     Aches in her bone crestor and lipitor   Family History  Problem Relation Age of Onset   Heart attack Mother 47   Heart disease Sister    Heart attack Brother 18   Heart attack Brother 49   Aneurysm Brother 10   Breast cancer Granddaughter 33  Migraines Neg Hx    PE: BP (!) 146/78   Pulse (!) 41   Ht 5\' 2"  (1.575 m)   Wt 149 lb 12.8 oz (67.9 kg)   SpO2 98%   BMI 27.40 kg/m  Pulse on recheck: 63 bpm. Wt Readings from Last 3 Encounters:  05/08/23 149 lb 12.8 oz (67.9 kg)  12/25/22 149 lb 3.2 oz (67.7 kg)  12/06/22 150 lb 9.6 oz (68.3 kg)   Constitutional: Slight overweight, in NAD Eyes: EOMI, no exophthalmos ENT:  + slight  left thyroid fullness, no cervical lymphadenopathy Cardiovascular: RRR, No MRG Respiratory: CTA B Musculoskeletal: + Heberden and Bouchard finger deformities Skin: no rashes Neurological: no tremor with outstretched hands Diabetic Foot Exam - Simple   Simple Foot Form Diabetic Foot exam was performed with the following findings: Yes 05/08/2023  8:53 AM  Visual Inspection No deformities, no ulcerations, no other skin breakdown bilaterally: Yes Sensation Testing Intact to touch and monofilament testing bilaterally: Yes Pulse Check Posterior Tibialis and Dorsalis pulse intact bilaterally: Yes Comments Hallux valgus B    ASSESSMENT: 1. DM2, non-insulin-dependent, controlled, with long-term complications -Mild CKD -Diabetic retinopathy  2. HL  3.  Hypothyroidism due to Hashimoto's thyroiditis  PLAN:  1. Patient with history of controlled type 2 diabetes, on oral antidiabetic regimen with metformin only.  She could not increase the dose of metformin further, due to diarrhea.  However, HbA1c at last visit was 6.6%, at goal.  She was mostly checking blood sugars midday and for the majority of the time they were at goal.  She only had occasional mildly hyperglycemic spikes.  We did not change her regimen at that time.  I did advise her to check blood sugars once a day, rotating check times, and write them down. -At today's visit, the majority of her blood sugars are at goal.  Highest blood sugars was in the 160s and she only had 1 low blood sugar, and 59.  For now, we can continue the current regimen.  I refilled her metformin. - I suggested to:  Patient Instructions  Please continue Metformin ER 1000 mg in am and 500 mg with dinner.  Check sugars 1x a day, rotating check times and write them down.  Continue Levothyroxine 112 mcg daily.  Take the thyroid hormone every day, with water, at least 30 minutes before breakfast, separated by at least 4 hours from: - acid reflux medications -  calcium - iron - multivitamins  Please return in 6 months with your sugar log.   - we checked her HbA1c: 6.4% (lower) - advised to check sugars at different times of the day - 1x a day, rotating check times - advised for yearly eye exams >> she is UTD - return to clinic in 6 months  2. HL -Reviewed latest lipid panel from 10/2022: Triglycerides higher than before, at 311, and LDL still above target, although slightly improved, at 104 -She is intolerant to statins and had a rash with fenofibrate.  She is on Zetia 10 mg daily and fish oil 1000 mg twice a day.   3.  Hashimoto's hypothyroidism -In the past, her left thyroid lobe appeared more prominent on palpation so we checked a thyroid ultrasound.  It was negative for nodules. - latest thyroid labs reviewed with pt. >> normal: TSH 0.64 in 10/2022 - she continues on LT4 112 mcg daily - pt feels good on this dose. - we discussed about taking the thyroid hormone every day, with water, >30  minutes before breakfast, separated by >4 hours from acid reflux medications, calcium, iron, multivitamins. Pt. is taking it correctly.  At last visit she was taking multivitamins only 1 hour after levothyroxine and I advised her to move these at least 4 hours away.  She does this now.  Carlus Pavlov, MD PhD [facility]

## 2023-05-29 DIAGNOSIS — E1122 Type 2 diabetes mellitus with diabetic chronic kidney disease: Secondary | ICD-10-CM | POA: Diagnosis not present

## 2023-05-29 DIAGNOSIS — E039 Hypothyroidism, unspecified: Secondary | ICD-10-CM | POA: Diagnosis not present

## 2023-05-29 DIAGNOSIS — E782 Mixed hyperlipidemia: Secondary | ICD-10-CM | POA: Diagnosis not present

## 2023-05-29 DIAGNOSIS — N1831 Chronic kidney disease, stage 3a: Secondary | ICD-10-CM | POA: Diagnosis not present

## 2023-05-29 DIAGNOSIS — G720 Drug-induced myopathy: Secondary | ICD-10-CM | POA: Diagnosis not present

## 2023-05-29 DIAGNOSIS — K5289 Other specified noninfective gastroenteritis and colitis: Secondary | ICD-10-CM | POA: Diagnosis not present

## 2023-05-29 DIAGNOSIS — I129 Hypertensive chronic kidney disease with stage 1 through stage 4 chronic kidney disease, or unspecified chronic kidney disease: Secondary | ICD-10-CM | POA: Diagnosis not present

## 2023-05-29 DIAGNOSIS — H6121 Impacted cerumen, right ear: Secondary | ICD-10-CM | POA: Diagnosis not present

## 2023-05-29 DIAGNOSIS — F3342 Major depressive disorder, recurrent, in full remission: Secondary | ICD-10-CM | POA: Diagnosis not present

## 2023-06-01 ENCOUNTER — Inpatient Hospital Stay: Payer: Medicare PPO | Admitting: Oncology

## 2023-06-01 ENCOUNTER — Telehealth: Payer: Self-pay

## 2023-06-01 ENCOUNTER — Inpatient Hospital Stay: Payer: Medicare PPO | Attending: Hematology & Oncology

## 2023-06-01 ENCOUNTER — Encounter: Payer: Self-pay | Admitting: Oncology

## 2023-06-01 VITALS — BP 137/53 | HR 64 | Temp 98.3°F | Resp 17 | Wt 149.4 lb

## 2023-06-01 DIAGNOSIS — D649 Anemia, unspecified: Secondary | ICD-10-CM | POA: Diagnosis not present

## 2023-06-01 DIAGNOSIS — C183 Malignant neoplasm of hepatic flexure: Secondary | ICD-10-CM

## 2023-06-01 DIAGNOSIS — K5900 Constipation, unspecified: Secondary | ICD-10-CM | POA: Insufficient documentation

## 2023-06-01 DIAGNOSIS — D508 Other iron deficiency anemias: Secondary | ICD-10-CM

## 2023-06-01 DIAGNOSIS — Z9221 Personal history of antineoplastic chemotherapy: Secondary | ICD-10-CM | POA: Diagnosis not present

## 2023-06-01 DIAGNOSIS — C185 Malignant neoplasm of splenic flexure: Secondary | ICD-10-CM

## 2023-06-01 DIAGNOSIS — N289 Disorder of kidney and ureter, unspecified: Secondary | ICD-10-CM | POA: Diagnosis not present

## 2023-06-01 DIAGNOSIS — C184 Malignant neoplasm of transverse colon: Secondary | ICD-10-CM | POA: Diagnosis not present

## 2023-06-01 DIAGNOSIS — Z85038 Personal history of other malignant neoplasm of large intestine: Secondary | ICD-10-CM | POA: Insufficient documentation

## 2023-06-01 LAB — CBC WITH DIFFERENTIAL (CANCER CENTER ONLY)
Abs Immature Granulocytes: 0.04 10*3/uL (ref 0.00–0.07)
Basophils Absolute: 0 10*3/uL (ref 0.0–0.1)
Basophils Relative: 1 %
Eosinophils Absolute: 0.4 10*3/uL (ref 0.0–0.5)
Eosinophils Relative: 6 %
HCT: 33.7 % — ABNORMAL LOW (ref 36.0–46.0)
Hemoglobin: 11.7 g/dL — ABNORMAL LOW (ref 12.0–15.0)
Immature Granulocytes: 1 %
Lymphocytes Relative: 37 %
Lymphs Abs: 2.5 10*3/uL (ref 0.7–4.0)
MCH: 31.5 pg (ref 26.0–34.0)
MCHC: 34.7 g/dL (ref 30.0–36.0)
MCV: 90.6 fL (ref 80.0–100.0)
Monocytes Absolute: 0.5 10*3/uL (ref 0.1–1.0)
Monocytes Relative: 8 %
Neutro Abs: 3.2 10*3/uL (ref 1.7–7.7)
Neutrophils Relative %: 47 %
Platelet Count: 206 10*3/uL (ref 150–400)
RBC: 3.72 MIL/uL — ABNORMAL LOW (ref 3.87–5.11)
RDW: 11.8 % (ref 11.5–15.5)
WBC Count: 6.7 10*3/uL (ref 4.0–10.5)
nRBC: 0 % (ref 0.0–0.2)

## 2023-06-01 LAB — CMP (CANCER CENTER ONLY)
ALT: 28 U/L (ref 0–44)
AST: 22 U/L (ref 15–41)
Albumin: 4.6 g/dL (ref 3.5–5.0)
Alkaline Phosphatase: 39 U/L (ref 38–126)
Anion gap: 10 (ref 5–15)
BUN: 29 mg/dL — ABNORMAL HIGH (ref 8–23)
CO2: 28 mmol/L (ref 22–32)
Calcium: 11.4 mg/dL — ABNORMAL HIGH (ref 8.9–10.3)
Chloride: 98 mmol/L (ref 98–111)
Creatinine: 1.4 mg/dL — ABNORMAL HIGH (ref 0.44–1.00)
GFR, Estimated: 38 mL/min — ABNORMAL LOW (ref >60)
Glucose, Bld: 140 mg/dL — ABNORMAL HIGH (ref 70–99)
Potassium: 3.8 mmol/L (ref 3.5–5.1)
Sodium: 136 mmol/L (ref 135–145)
Total Bilirubin: 0.6 mg/dL (ref 0.3–1.2)
Total Protein: 7 g/dL (ref 6.5–8.1)

## 2023-06-01 LAB — IRON AND IRON BINDING CAPACITY (CC-WL,HP ONLY)
Iron: 104 ug/dL (ref 28–170)
Saturation Ratios: 27 % (ref 10.4–31.8)
TIBC: 379 ug/dL (ref 250–450)
UIBC: 275 ug/dL (ref 148–442)

## 2023-06-01 LAB — RETICULOCYTES
Immature Retic Fract: 10.8 % (ref 2.3–15.9)
RBC.: 3.6 MIL/uL — ABNORMAL LOW (ref 3.87–5.11)
Retic Count, Absolute: 66.6 10*3/uL (ref 19.0–186.0)
Retic Ct Pct: 1.9 % (ref 0.4–3.1)

## 2023-06-01 LAB — CEA (IN HOUSE-CHCC): CEA (CHCC-In House): 2.57 ng/mL (ref 0.00–5.00)

## 2023-06-01 LAB — FERRITIN: Ferritin: 190 ng/mL (ref 11–307)

## 2023-06-01 NOTE — Telephone Encounter (Signed)
Advised via MyChart.

## 2023-06-01 NOTE — Progress Notes (Signed)
Hematology and Oncology Follow Up Visit  Robin [name] [id number] [birth year] 82 y.o. 06/01/2023   Principle Diagnosis:  1) history of locally recurrent adenocarcinoma of the colon 2) iron deficiency anemia  Current Therapy:   Observation    Interim History:  Robin [name] is here today for a 32-month follow-up.  At her last visit with [place], she received Venofer x 3 doses on 12/11/2022, 12/18/2022, and 12/25/2022.  She tolerated this well.  She reports some mild discomfort in her left lower abdomen.  States that she has been seen by GI and had a repeat colonoscopy which was normal.  He is not having any nausea or vomiting.  Ports intermittent constipation.  Not taking anything for this. No melena or hematochezia.  Denies chest pain and shortness of breath.  Medications:  Allergies as of 06/01/2023       Reactions   Ace Inhibitors Other (See Comments)   cough   Atorvastatin Other (See Comments)   Beef (diagnostic) Other (See Comments)   Celecoxib Other (See Comments)   Rosuvastatin Other (See Comments)   Statins    Aches in her bone crestor and lipitor        Medication List        Accurate as of June 01, 2023  9:58 AM. If you have any questions, ask your nurse or doctor.          Accu-Chek Guide Me w/Device Kit Use as instructed to check sugar once daily   Accu-Chek Softclix Lancets lancets Use as instructed to check sugar once daily   amLODipine 10 MG tablet Commonly known as: NORVASC Take 10 mg by mouth Daily.   aspirin 81 MG tablet Take 81 mg by mouth daily.   beta carotene w/minerals tablet Take 1 tablet by mouth daily.   budesonide 3 MG 24 hr capsule Commonly known as: ENTOCORT EC Take by mouth daily as needed.   EPINEPHrine 0.3 mg/0.3 mL Soaj injection Commonly known as: EPI-PEN See admin instructions.   ezetimibe 10 MG tablet Commonly known as: ZETIA daily.   famotidine 40 MG tablet Commonly known as: PEPCID 1 tablet at bedtime.   fexofenadine  180 MG tablet Commonly known as: ALLEGRA Take 1 tablet (180 mg total) by mouth 2 (two) times daily.   Fluocinolone Acetonide Body 0.01 % Oil APPLY OIL TOPICALLY TO AFFECTED AREA TWICE DAILY AS NEEDED (APPLY SPARINGLY)   FLUoxetine 20 MG capsule Commonly known as: PROZAC Take 20 mg by mouth Daily.   fluticasone 50 MCG/ACT nasal spray Commonly known as: FLONASE Place 1 spray into both nostrils daily.   Glucosamine HCl 1500 MG Tabs Take 1,500 mg by mouth daily.   levothyroxine 112 MCG tablet Commonly known as: SYNTHROID Take 112 mcg by mouth daily before breakfast.   Magnesium 400 MG Tabs daily.   metFORMIN 500 MG 24 hr tablet Commonly known as: GLUCOPHAGE-XR TAKE 2 TABLETS BY MOUTH IN THE MORNING AND 1 TABLET IN THE EVENING   multivitamin capsule Take 1 capsule by mouth daily.   OneTouch Verio test strip Generic drug: glucose blood USE TO CHECK YOU BLOOD SUGAR ONCE OR TWICE DAILY   polyvinyl alcohol 1.4 % ophthalmic solution Commonly known as: LIQUIFILM TEARS Place 1 drop into both eyes as needed for dry eyes.   Probiotic Daily Caps Take 1 capsule by mouth every morning.   triamterene-hydrochlorothiazide 37.5-25 MG capsule Commonly known as: DYAZIDE Take 1 capsule by mouth Daily.   vitamin B-12 100 MCG tablet Commonly known  as: CYANOCOBALAMIN daily.   Vitamin C 500 MG Caps daily.   VITAMIN D-3 PO Take 2,000 Units by mouth daily.   Zinc 30 MG Caps daily.        Allergies:  Allergies  Allergen Reactions   Ace Inhibitors Other (See Comments)    cough   Atorvastatin Other (See Comments)   Beef (Diagnostic) Other (See Comments)   Celecoxib Other (See Comments)   Rosuvastatin Other (See Comments)   Statins     Aches in her bone crestor and lipitor    Past Medical History, Surgical history, Social history, and Family History were reviewed and updated.  Review of Systems: Review of Systems  Constitutional: Negative.   HENT: Negative.    Eyes:  Negative.   Respiratory: Negative.    Cardiovascular: Negative.   Gastrointestinal:  Positive for constipation.  Genitourinary: Negative.   Musculoskeletal: Negative.   Skin: Negative.   Endo/Heme/Allergies: Negative.   Psychiatric/Behavioral: Negative.      Physical Exam:  weight is 67.8 kg. Her oral temperature is 98.3 F (36.8 C). Her blood pressure is 137/53 (abnormal) and her pulse is 64. Her respiration is 17 and oxygen saturation is 100%.  ECOG performance status: 1  Wt Readings from Last 3 Encounters:  06/01/23 67.8 kg  05/08/23 67.9 kg  12/25/22 67.7 kg    Physical Exam Vitals reviewed.  HENT:     Head: Normocephalic and atraumatic.  Cardiovascular:     Rate and Rhythm: Normal rate and regular rhythm.     Heart sounds: Normal heart sounds.  Pulmonary:     Effort: Pulmonary effort is normal.     Breath sounds: Normal breath sounds.  Abdominal:     General: Bowel sounds are normal.     Palpations: Abdomen is soft.  Musculoskeletal:        General: No tenderness or deformity. Normal range of motion.     Cervical back: Normal range of motion.  Lymphadenopathy:     Cervical: No cervical adenopathy.  Skin:    General: Skin is warm and dry.     Findings: No erythema or rash.  Neurological:     Mental Status: She is alert and oriented to person, place, and time.  Psychiatric:        Behavior: Behavior normal.        Thought Content: Thought content normal.        Judgment: Judgment normal.      Lab Results  Component Value Date   WBC 6.7 06/01/2023   HGB 11.7 (L) 06/01/2023   HCT 33.7 (L) 06/01/2023   MCV 90.6 06/01/2023   PLT 206 06/01/2023   Lab Results  Component Value Date   FERRITIN 54 11/30/2022   IRON 58 11/30/2022   TIBC 391 11/30/2022   UIBC 333 11/30/2022   IRONPCTSAT 15 11/30/2022   Lab Results  Component Value Date   RETICCTPCT 1.9 06/01/2023   RBC 3.72 (L) 06/01/2023   RBC 3.60 (L) 06/01/2023   RETICCTABS 48.7 12/17/2008   No  results found for: "KPAFRELGTCHN", "LAMBDASER", "KAPLAMBRATIO" No results found for: "IGGSERUM", "IGA", "IGMSERUM" No results found for: "TOTALPROTELP", "ALBUMINELP", "A1GS", "A2GS", "BETS", "BETA2SER", "GAMS", "MSPIKE", "SPEI"   Chemistry      Component Value Date/Time   NA 136 06/01/2023 0913   NA 142 03/22/2018 1059   NA 139 05/21/2017 0838   K 3.8 06/01/2023 0913   K 4.3 05/21/2017 0838   CL 98 06/01/2023 0913   CL 102 05/18/2016  1505   CL 100 11/01/2015 0932   CO2 28 06/01/2023 0913   CO2 28 05/21/2017 0838   BUN 29 (H) 06/01/2023 0913   BUN 13 03/22/2018 1059   BUN 20.8 05/21/2017 0838   CREATININE 1.40 (H) 06/01/2023 0913   CREATININE 1.0 05/21/2017 0838      Component Value Date/Time   CALCIUM 11.4 (H) 06/01/2023 0913   CALCIUM 10.5 (H) 05/21/2017 0838   ALKPHOS 39 06/01/2023 0913   ALKPHOS 49 05/21/2017 0838   AST 22 06/01/2023 0913   AST 22 05/21/2017 0838   ALT 28 06/01/2023 0913   ALT 33 05/21/2017 0838   BILITOT 0.6 06/01/2023 0913   BILITOT 0.57 05/21/2017 0838      Impression and Plan: Ms. Mellick is a very pleasant 82 yo caucasian female with history of locally recurrent adenocarcinoma of the colon with resection in 2000. She received adjuvant FOLFOX.  I see no evidence of recurrent disease.  She is up-to-date with her colonoscopy.  CBC and CMP reviewed with the patient today.  She has mild anemia but improved from her last CBC performed 6 months ago.  Iron studies are pending and we will contact her with the results to let her know if she needs any additional IV iron.  Her CMP shows mild renal insufficiency which is overall stable.  Calcium level is elevated.  She is taking calcium daily.  I have advised her to reduce her calcium to every other day.  Plan is her follow-up visit in approximately 6 months. Clenton Pare, NP 9/6/20249:58 AM

## 2023-06-01 NOTE — Telephone Encounter (Signed)
-----   Message from Josph Macho sent at 06/01/2023  3:08 PM EDT ----- Please call and let her know that the iron levels look okay.  Thanks.  Cindee Lame

## 2023-06-15 DIAGNOSIS — K219 Gastro-esophageal reflux disease without esophagitis: Secondary | ICD-10-CM | POA: Diagnosis not present

## 2023-06-15 DIAGNOSIS — N189 Chronic kidney disease, unspecified: Secondary | ICD-10-CM | POA: Diagnosis not present

## 2023-06-15 DIAGNOSIS — G4733 Obstructive sleep apnea (adult) (pediatric): Secondary | ICD-10-CM | POA: Diagnosis not present

## 2023-06-15 DIAGNOSIS — J309 Allergic rhinitis, unspecified: Secondary | ICD-10-CM | POA: Diagnosis not present

## 2023-06-15 DIAGNOSIS — I129 Hypertensive chronic kidney disease with stage 1 through stage 4 chronic kidney disease, or unspecified chronic kidney disease: Secondary | ICD-10-CM | POA: Diagnosis not present

## 2023-06-15 DIAGNOSIS — R32 Unspecified urinary incontinence: Secondary | ICD-10-CM | POA: Diagnosis not present

## 2023-06-15 DIAGNOSIS — F419 Anxiety disorder, unspecified: Secondary | ICD-10-CM | POA: Diagnosis not present

## 2023-06-15 DIAGNOSIS — E785 Hyperlipidemia, unspecified: Secondary | ICD-10-CM | POA: Diagnosis not present

## 2023-06-15 DIAGNOSIS — M1990 Unspecified osteoarthritis, unspecified site: Secondary | ICD-10-CM | POA: Diagnosis not present

## 2023-07-06 DIAGNOSIS — L821 Other seborrheic keratosis: Secondary | ICD-10-CM | POA: Diagnosis not present

## 2023-07-06 DIAGNOSIS — L570 Actinic keratosis: Secondary | ICD-10-CM | POA: Diagnosis not present

## 2023-07-06 DIAGNOSIS — D2372 Other benign neoplasm of skin of left lower limb, including hip: Secondary | ICD-10-CM | POA: Diagnosis not present

## 2023-07-06 DIAGNOSIS — Z85828 Personal history of other malignant neoplasm of skin: Secondary | ICD-10-CM | POA: Diagnosis not present

## 2023-07-06 DIAGNOSIS — L910 Hypertrophic scar: Secondary | ICD-10-CM | POA: Diagnosis not present

## 2023-07-06 DIAGNOSIS — L578 Other skin changes due to chronic exposure to nonionizing radiation: Secondary | ICD-10-CM | POA: Diagnosis not present

## 2023-07-06 DIAGNOSIS — L723 Sebaceous cyst: Secondary | ICD-10-CM | POA: Diagnosis not present

## 2023-07-06 DIAGNOSIS — Z86018 Personal history of other benign neoplasm: Secondary | ICD-10-CM | POA: Diagnosis not present

## 2023-07-06 DIAGNOSIS — D225 Melanocytic nevi of trunk: Secondary | ICD-10-CM | POA: Diagnosis not present

## 2023-08-08 DIAGNOSIS — M160 Bilateral primary osteoarthritis of hip: Secondary | ICD-10-CM | POA: Diagnosis not present

## 2023-08-08 DIAGNOSIS — M79604 Pain in right leg: Secondary | ICD-10-CM | POA: Diagnosis not present

## 2023-08-08 DIAGNOSIS — M25551 Pain in right hip: Secondary | ICD-10-CM | POA: Diagnosis not present

## 2023-08-09 ENCOUNTER — Other Ambulatory Visit (HOSPITAL_COMMUNITY): Payer: Self-pay | Admitting: Pain Medicine

## 2023-08-09 ENCOUNTER — Ambulatory Visit (HOSPITAL_COMMUNITY)
Admission: RE | Admit: 2023-08-09 | Discharge: 2023-08-09 | Disposition: A | Discharge status: Home / Self Care | Payer: Medicare PPO | Source: Ambulatory Visit | Attending: Vascular Surgery | Admitting: Vascular Surgery

## 2023-08-09 DIAGNOSIS — R52 Pain, unspecified: Secondary | ICD-10-CM

## 2023-08-20 DIAGNOSIS — D485 Neoplasm of uncertain behavior of skin: Secondary | ICD-10-CM | POA: Diagnosis not present

## 2023-08-20 DIAGNOSIS — D225 Melanocytic nevi of trunk: Secondary | ICD-10-CM | POA: Diagnosis not present

## 2023-10-04 DIAGNOSIS — Z6828 Body mass index (BMI) 28.0-28.9, adult: Secondary | ICD-10-CM | POA: Diagnosis not present

## 2023-10-04 DIAGNOSIS — E1122 Type 2 diabetes mellitus with diabetic chronic kidney disease: Secondary | ICD-10-CM | POA: Diagnosis not present

## 2023-10-04 DIAGNOSIS — M160 Bilateral primary osteoarthritis of hip: Secondary | ICD-10-CM | POA: Diagnosis not present

## 2023-10-04 DIAGNOSIS — R944 Abnormal results of kidney function studies: Secondary | ICD-10-CM | POA: Diagnosis not present

## 2023-10-04 DIAGNOSIS — N1832 Chronic kidney disease, stage 3b: Secondary | ICD-10-CM | POA: Diagnosis not present

## 2023-10-08 DIAGNOSIS — G4733 Obstructive sleep apnea (adult) (pediatric): Secondary | ICD-10-CM | POA: Diagnosis not present

## 2023-10-12 DIAGNOSIS — G4733 Obstructive sleep apnea (adult) (pediatric): Secondary | ICD-10-CM | POA: Diagnosis not present

## 2023-10-29 ENCOUNTER — Encounter (HOSPITAL_BASED_OUTPATIENT_CLINIC_OR_DEPARTMENT_OTHER): Payer: Self-pay | Admitting: Emergency Medicine

## 2023-10-29 ENCOUNTER — Emergency Department (HOSPITAL_BASED_OUTPATIENT_CLINIC_OR_DEPARTMENT_OTHER)
Admission: EM | Admit: 2023-10-29 | Discharge: 2023-10-29 | Disposition: A | Discharge status: Home / Self Care | Payer: Medicare PPO | Attending: Emergency Medicine | Admitting: Emergency Medicine

## 2023-10-29 ENCOUNTER — Emergency Department (HOSPITAL_BASED_OUTPATIENT_CLINIC_OR_DEPARTMENT_OTHER): Payer: Medicare PPO | Admitting: Radiology

## 2023-10-29 DIAGNOSIS — Z7982 Long term (current) use of aspirin: Secondary | ICD-10-CM | POA: Diagnosis not present

## 2023-10-29 DIAGNOSIS — Z7984 Long term (current) use of oral hypoglycemic drugs: Secondary | ICD-10-CM | POA: Diagnosis not present

## 2023-10-29 DIAGNOSIS — R0789 Other chest pain: Secondary | ICD-10-CM | POA: Diagnosis not present

## 2023-10-29 DIAGNOSIS — D649 Anemia, unspecified: Secondary | ICD-10-CM | POA: Diagnosis not present

## 2023-10-29 DIAGNOSIS — I10 Essential (primary) hypertension: Secondary | ICD-10-CM | POA: Diagnosis not present

## 2023-10-29 DIAGNOSIS — R0602 Shortness of breath: Secondary | ICD-10-CM | POA: Diagnosis not present

## 2023-10-29 DIAGNOSIS — M25512 Pain in left shoulder: Secondary | ICD-10-CM | POA: Diagnosis not present

## 2023-10-29 DIAGNOSIS — R739 Hyperglycemia, unspecified: Secondary | ICD-10-CM | POA: Insufficient documentation

## 2023-10-29 DIAGNOSIS — Z79899 Other long term (current) drug therapy: Secondary | ICD-10-CM | POA: Insufficient documentation

## 2023-10-29 DIAGNOSIS — M19012 Primary osteoarthritis, left shoulder: Secondary | ICD-10-CM | POA: Diagnosis not present

## 2023-10-29 DIAGNOSIS — R079 Chest pain, unspecified: Secondary | ICD-10-CM | POA: Diagnosis not present

## 2023-10-29 LAB — BASIC METABOLIC PANEL
Anion gap: 12 (ref 5–15)
BUN: 27 mg/dL — ABNORMAL HIGH (ref 8–23)
CO2: 25 mmol/L (ref 22–32)
Calcium: 9.9 mg/dL (ref 8.9–10.3)
Chloride: 99 mmol/L (ref 98–111)
Creatinine, Ser: 1.57 mg/dL — ABNORMAL HIGH (ref 0.44–1.00)
GFR, Estimated: 33 mL/min — ABNORMAL LOW (ref >60)
Glucose, Bld: 156 mg/dL — ABNORMAL HIGH (ref 70–99)
Potassium: 3.5 mmol/L (ref 3.5–5.1)
Sodium: 136 mmol/L (ref 135–145)

## 2023-10-29 LAB — CBC
HCT: 33.6 % — ABNORMAL LOW (ref 36.0–46.0)
Hemoglobin: 11.6 g/dL — ABNORMAL LOW (ref 12.0–15.0)
MCH: 30.9 pg (ref 26.0–34.0)
MCHC: 34.5 g/dL (ref 30.0–36.0)
MCV: 89.4 fL (ref 80.0–100.0)
Platelets: 225 10*3/uL (ref 150–400)
RBC: 3.76 MIL/uL — ABNORMAL LOW (ref 3.87–5.11)
RDW: 12.2 % (ref 11.5–15.5)
WBC: 7.9 10*3/uL (ref 4.0–10.5)
nRBC: 0 % (ref 0.0–0.2)

## 2023-10-29 LAB — TROPONIN I (HIGH SENSITIVITY)
Troponin I (High Sensitivity): 15 ng/L (ref <18)
Troponin I (High Sensitivity): 19 ng/L — ABNORMAL HIGH (ref <18)

## 2023-10-29 MED ORDER — LOSARTAN POTASSIUM 50 MG PO TABS: 75.0000 mg | ORAL_TABLET | Freq: Every day | ORAL | 0 refills | Status: DC

## 2023-10-29 MED ORDER — CLONIDINE HCL 0.1 MG PO TABS
0.1000 mg | ORAL_TABLET | Freq: Once | ORAL | Status: AC
  Administered 2023-10-29: 0.1 mg via ORAL
  Filled 2023-10-29: qty 1

## 2023-10-29 NOTE — ED Triage Notes (Signed)
Pt referred by pcp, pt c/o LT side CP radiating to LT arm x 2 weeks. Endorses shob

## 2023-10-29 NOTE — ED Provider Notes (Signed)
[place] [facility]PARTMENT AT [facility] Provider Note   CSN: [id number] Arrival date & time: 10/29/23  1026     History  Chief Complaint  Patient presents with   Chest Pain    Robin [name] is a 83 y.o. female.  With history of hypertension, hyperlipidemia, sleep apnea presenting to the ED for evaluation of chest pain.  Pain began approximately 2 weeks ago.  Pain began shortly after her PCP changed her antihypertensive regimen.  She is also concerned for potential sprain in the left shoulder where the pain is.  She states there is no pain with movement but as soon as she picks up an object she reports shooting pain from her left wrist into her left upper chest.  Otherwise she reports some mild left-sided chest pain at rest which is nonexertional.  No associated nausea, vomiting or diaphoresis.  No abdominal pain, fevers or chills.  Some mild shortness of breath when standing for prolonged period of time.  No cough.   Chest Pain      Home Medications Prior to Admission medications   Medication Sig Start Date End Date Taking? Authorizing Provider  losartan (COZAAR) 50 MG tablet Take 1.5 tablets (75 mg total) by mouth daily. 10/29/23  Yes Nakema Fake, Edsel Petrin, PA-C  Accu-Chek Softclix Lancets lancets Use as instructed to check sugar once daily 04/27/22   Carlus Pavlov, MD  amLODipine (NORVASC) 10 MG tablet Take 10 mg by mouth Daily. 07/28/11   [provider]  Ascorbic Acid (VITAMIN C) 500 MG CAPS daily.    [provider]  aspirin 81 MG tablet Take 81 mg by mouth daily.    [provider]  beta carotene w/minerals (OCUVITE) tablet Take 1 tablet by mouth daily.    [provider]  Blood Glucose Monitoring Suppl (ACCU-CHEK GUIDE ME) w/Device KIT Use as instructed to check sugar once daily 12/21/20   Carlus Pavlov, MD  budesonide (ENTOCORT EC) 3 MG 24 hr capsule Take by mouth daily as needed. 05/25/20   [provider]   Cholecalciferol (VITAMIN D-3 PO) Take 2,000 Units by mouth daily.    [provider]  EPINEPHrine 0.3 mg/0.3 mL IJ SOAJ injection See admin instructions. 05/22/18   [provider]  ezetimibe (ZETIA) 10 MG tablet daily. 07/28/20   [provider]  famotidine (PEPCID) 40 MG tablet 1 tablet at bedtime.    [provider]  fexofenadine (ALLEGRA) 180 MG tablet Take 1 tablet (180 mg total) by mouth 2 (two) times daily. 04/01/18   Marcelyn Bruins, MD  Fluocinolone Acetonide Body 0.01 % OIL APPLY OIL TOPICALLY TO AFFECTED AREA TWICE DAILY AS NEEDED (APPLY SPARINGLY) 03/26/19   [provider]  FLUoxetine (PROZAC) 20 MG capsule Take 20 mg by mouth Daily. 07/27/11   [provider]  fluticasone (FLONASE) 50 MCG/ACT nasal spray Place 1 spray into both nostrils daily. 10/28/18   [provider]  Glucosamine HCl 1500 MG TABS Take 1,500 mg by mouth daily.     [provider]  glucose blood (ONETOUCH VERIO) test strip USE TO CHECK YOU BLOOD SUGAR ONCE OR TWICE DAILY 11/23/22   Carlus Pavlov, MD  levothyroxine (SYNTHROID, LEVOTHROID) 112 MCG tablet Take 112 mcg by mouth daily before breakfast.  06/24/15   [provider]  Magnesium 400 MG TABS daily.    [provider]  metFORMIN (GLUCOPHAGE-XR) 500 MG 24 hr tablet TAKE 2 TABLETS BY MOUTH IN THE MORNING AND 1 TABLET  IN THE EVENING 05/08/23   Carlus Pavlov, MD  Multiple Vitamin (MULTIVITAMIN) capsule Take 1 capsule by mouth daily.    [provider]  polyvinyl alcohol (LIQUIFILM TEARS) 1.4 % ophthalmic solution Place 1 drop into both eyes as needed for dry eyes.    [provider]  Probiotic Product (PROBIOTIC DAILY) CAPS Take 1 capsule by mouth every morning.     [provider]  triamterene-hydrochlorothiazide (DYAZIDE) 37.5-25 MG per capsule Take 1 capsule by mouth Daily.  07/27/11   [provider]  vitamin B-12 (CYANOCOBALAMIN)  100 MCG tablet daily.    [provider]  Zinc 30 MG CAPS daily.    [provider]      Allergies    Ace inhibitors, Atorvastatin, Beef (diagnostic), Celecoxib, Rosuvastatin, and Statins    Review of Systems   Review of Systems  Cardiovascular:  Positive for chest pain.  All other systems reviewed and are negative.   Physical Exam Updated Vital Signs BP (!) 181/72   Pulse (!) 48   Temp 97.8 F (36.6 C)   Resp 12   Wt 65.3 kg   SpO2 97%   BMI 26.34 kg/m  Physical Exam Vitals and nursing note reviewed.  Constitutional:      General: She is not in acute distress.    Appearance: Normal appearance. She is normal weight. She is not ill-appearing.     Comments: Resting comfortably in bed  HENT:     Head: Normocephalic and atraumatic.  Cardiovascular:     Rate and Rhythm: Normal rate and regular rhythm.  Pulmonary:     Effort: Pulmonary effort is normal. No respiratory distress.     Breath sounds: No decreased breath sounds, wheezing, rhonchi or rales.  Chest:     Comments: No rashes of the chest.  No tenderness to palpation of the chest or left shoulder. Abdominal:     General: Abdomen is flat.  Musculoskeletal:        General: Normal range of motion.     Cervical back: Neck supple.     Right lower leg: No edema.     Left lower leg: No edema.  Skin:    General: Skin is warm and dry.  Neurological:     Mental Status: She is alert and oriented to person, place, and time.  Psychiatric:        Mood and Affect: Mood normal.        Behavior: Behavior normal.     ED Results / Procedures / Treatments   Labs (all labs ordered are listed, but only abnormal results are displayed) Labs Reviewed  BASIC METABOLIC PANEL - Abnormal; Notable for the following components:      Result Value   Glucose, Bld 156 (*)    BUN 27 (*)    Creatinine, Ser 1.57 (*)    GFR, Estimated 33 (*)    All other components within normal limits  CBC - Abnormal; Notable for the  following components:   RBC 3.76 (*)    Hemoglobin 11.6 (*)    HCT 33.6 (*)    All other components within normal limits  TROPONIN I (HIGH SENSITIVITY) - Abnormal; Notable for the following components:   Troponin I (High Sensitivity) 19 (*)    All other components within normal limits  TROPONIN I (HIGH SENSITIVITY)    EKG EKG Interpretation Date/Time:  Monday October 29 2023 10:46:25 EST Ventricular Rate:  63 PR Interval:    QRS Duration:  90 QT Interval:  380 QTC Calculation: 388 R Axis:   62  Text Interpretation: No significant change since last tracing Possible Anterior infarct , age undetermined downsloping st segments laterally Otherwise no significant change since 2010 Confirmed by Melene Plan [phone]) on 10/29/2023 4:46:16 PM  Radiology DG Shoulder Left Result Date: 10/29/2023 CLINICAL DATA:  Left shoulder pain. EXAM: LEFT SHOULDER - 2+ VIEW COMPARISON:  11/30/2022. FINDINGS: No acute fracture or dislocation. No aggressive osseous lesion. Glenohumeral and acromioclavicular joints are normal in alignment and exhibit moderate to severe degenerative changes. No soft tissue swelling. No radiopaque foreign bodies. IMPRESSION: *No acute osseous abnormality of the left shoulder. Moderate-to-severe degenerative joint disease. Electronically Signed   By: Jules Schick M.D.   On: 10/29/2023 17:01   DG Chest 2 View Result Date: 10/29/2023 CLINICAL DATA:  Chest pain, shortness of breath EXAM: CHEST - 2 VIEW COMPARISON:  06/24/2018 FINDINGS: Heart and mediastinal contours are within normal limits. No focal opacities or effusions. No acute bony abnormality. IMPRESSION: No active cardiopulmonary disease. Electronically Signed   By: Charlett Nose M.D.   On: 10/29/2023 11:48    Procedures Procedures    Medications Ordered in ED Medications  cloNIDine (CATAPRES) tablet 0.1 mg (0.1 mg Oral Given 10/29/23 1711)    ED Course/ Medical Decision Making/ A&P             HEART Score: 4                     Medical Decision Making Amount and/or Complexity of Data Reviewed Labs: ordered. Radiology: ordered.  Risk Prescription drug management.  This patient presents to the ED for concern of chest pain, this involves an extensive number of treatment options, and is a complaint that carries with it a high risk of complications and morbidity.  The emergent differential diagnosis of chest pain includes: Acute coronary syndrome, pericarditis, aortic dissection, pulmonary embolism, tension pneumothorax, and esophageal rupture.  I do not believe the patient has an emergent cause of chest pain, other urgent/non-acute considerations include, but are not limited to: chronic angina, aortic stenosis, cardiomyopathy, myocarditis, mitral valve prolapse, pulmonary hypertension, hypertrophic obstructive cardiomyopathy (HOCM), aortic insufficiency, right ventricular hypertrophy, pneumonia, pleuritis, bronchitis, pneumothorax, tumor, gastroesophageal reflux disease (GERD), esophageal spasm, Mallory-Weiss syndrome, peptic ulcer disease, biliary disease, pancreatitis, functional gastrointestinal pain, cervical or thoracic disk disease or arthritis, shoulder arthritis, costochondritis, subacromial bursitis, anxiety or panic attack, herpes zoster, breast disorders, chest wall tumors, thoracic outlet syndrome, mediastinitis.  My initial workup includes ACS rule out  Additional history obtained from: Nursing notes from this visit. Son at bedside provides portion of the history  I ordered, reviewed and interpreted labs which include: CBC, BMP, troponin, delta troponin.  No leukocytosis.  Mild anemia.  No electrolyte derangement.  Hyperglycemia 156.  BUN and creatinine stable, near baseline.  Initial troponin 15.  I ordered imaging studies including chest x-ray I independently visualized and interpreted imaging which showed negative I agree with the radiologist interpretation  Cardiac Monitoring:  The patient was  maintained on a cardiac monitor.  I personally viewed and interpreted the cardiac monitored which showed an underlying rhythm of: NSR  Afebrile, significantly hypertensive but otherwise hemodynamically stable.  83 year old female presenting to the ED for evaluation of left-sided chest pain and left shoulder pain.  Symptoms have been present for the past 2 weeks.  She had her blood pressure medications changed just prior to symptom onset.  She denies any shortness of breath.  She appears well on physical exam.  Left shoulder pain is predictably reproducible with weighted forward flexion.  EKG without acute ischemic changes.  Chest x-ray and shoulder x-ray negative.  Initial troponin negative at 15, delta troponin mildly increased to 19 but with a delta of less than 5, lower suspicion for ACS.  Heart score of 4.  I had a shared decision-making apposition with the patient regarding management.  She prefers discharge home with cardiology follow-up.  Believe this is reasonable.  She was given a dose of clonidine with improvement in her blood pressure in the emergency department.  Will increase her dose of losartan to 75 mg and strongly encouraged close outpatient follow-up with her primary care provider.  At this time there does not appear to be any evidence of an acute emergency medical condition and the patient appears stable for discharge with appropriate outpatient follow up. Diagnosis was discussed with patient who verbalizes understanding of care plan and is agreeable to discharge. I have discussed return precautions with patient and son at bedside who verbalizes understanding. Patient encouraged to follow-up with their PCP within 1 week. All questions answered.  Patient's case discussed with Dr. Adela Lank who agrees with plan to discharge with follow-up.   Note: Portions of this report may have been transcribed using voice recognition software. Every effort was made to ensure accuracy; however, inadvertent  computerized transcription errors may still be present.        Final Clinical Impression(s) / ED Diagnoses Final diagnoses:  Atypical chest pain  Uncontrolled hypertension    Rx / DC Orders ED Discharge Orders          Ordered    losartan (COZAAR) 50 MG tablet  Daily        10/29/23 1813    Ambulatory referral to Cardiology       Comments: If you have not heard from the Cardiology office within the next 72 hours please call [phone].   10/29/23 1815              Michelle Piper, PA-C 10/29/23 1820    Melene Plan, DO 10/29/23 [phone]

## 2023-10-29 NOTE — Discharge Instructions (Addendum)
You have been seen today for your complaint of chest pain. Your lab work was reassuring. Your imaging was reassuring. Your discharge medications include your home medications. Follow up with: Your primary care provider.  You should also follow-up with cardiology.  I have placed an amatory referral order so they should call you to schedule an appointment. Your discharge medications include losartan.  I think it is best to increase your dose to 75 mg which is 1.5 tablets which is elevated from what you had been using.  I think this is a better option than using clonidine. Please seek immediate medical care if you develop any of the following symptoms: Your chest pain gets worse. You have a cough that gets worse, or you cough up blood. You have severe pain in your abdomen. You faint. You have sudden, unexplained chest discomfort. You have sudden, unexplained discomfort in your arms, back, neck, or jaw. You have shortness of breath at any time. You suddenly start to sweat, or your skin gets clammy. You feel nausea or you vomit. You suddenly feel lightheaded or dizzy. You have severe weakness, or unexplained weakness or fatigue. Your heart begins to beat quickly, or it feels like it is skipping beats. At this time there does not appear to be the presence of an emergent medical condition, however there is always the potential for conditions to change. Please read and follow the below instructions.  Do not take your medicine if  develop an itchy rash, swelling in your mouth or lips, or difficulty breathing; call 911 and seek immediate emergency medical attention if this occurs.  You may review your lab tests and imaging results in their entirety on your MyChart account.  Please discuss all results of fully with your primary care provider and other specialist at your follow-up visit.  Note: Portions of this text may have been transcribed using voice recognition software. Every effort was made to  ensure accuracy; however, inadvertent computerized transcription errors may still be present.

## 2023-11-01 DIAGNOSIS — I10 Essential (primary) hypertension: Secondary | ICD-10-CM | POA: Diagnosis not present

## 2023-11-01 DIAGNOSIS — K219 Gastro-esophageal reflux disease without esophagitis: Secondary | ICD-10-CM | POA: Diagnosis not present

## 2023-11-01 DIAGNOSIS — R079 Chest pain, unspecified: Secondary | ICD-10-CM | POA: Diagnosis not present

## 2023-11-02 NOTE — Progress Notes (Deleted)
 Cardiology Office Note   Date:  11/02/2023   ID:  Sophia, Sperry 10-Oct-1940, MRN [id number]  PCP:  Sigmund Hazel, MD  Cardiologist:   Cheyene Hamric [place], MD   No chief complaint on file.     History of Present Illness: SUDA FORBESS is a 83 y.o. female who is seen for follow up of chest pain.  She has a history of OSA, DM, HLD, and HTN.   When seen in Dec 2022  she reported  pain in the chest and SOB. Describes the pain as a tightness in the central chest without radiation. Some SOB. Comes and goes. Thinks may be related to meals. No prior cardiac evaluation. Does have a strong family history of CAD. Intolerant to statins due to myalgias. She is still working 4 hours/day at [employer]. Lives alone.  We performed coronary CTA which demonstrated nonobstructive CAD. Modest ostial RCA disease with normal FFR. She notes that her chest tightness improved on omeprazole.   She was seen recently in the ED with atypical chest pain.    Past Medical History:  Diagnosis Date   Arthritis    Colon cancer (HCC)    colon ca dx 2008 and 2010   Complication of anesthesia    problems waking up-patient thinks she was given too much medication   Gestational diabetes    Headache    headaches all the time   Hyperlipidemia    Hypertension    Sleep apnea    uses CPAP   Urticaria     Past Surgical History:  Procedure Laterality Date   ABDOMINAL HYSTERECTOMY     APPENDECTOMY     BACK SURGERY  2009   CARPAL TUNNEL RELEASE     COLON SURGERY     COLONOSCOPY WITH PROPOFOL N/A 08/10/2015   Procedure: COLONOSCOPY WITH PROPOFOL;  Surgeon: Charolett Bumpers, MD;  Location: [facility];  Service: Endoscopy;  Laterality: N/A;   DILATION AND CURETTAGE OF UTERUS     had before had hysterectomy   ESOPHAGOGASTRODUODENOSCOPY (EGD) WITH PROPOFOL N/A 08/10/2015   Procedure: ESOPHAGOGASTRODUODENOSCOPY (EGD) WITH PROPOFOL;  Surgeon: Charolett Bumpers, MD;  Location: [facility];  Service: Endoscopy;  Laterality:  N/A;   EYE SURGERY     bilateral cataract surgery    ROTATOR CUFF REPAIR       Current Outpatient Medications  Medication Sig Dispense Refill   Accu-Chek Softclix Lancets lancets Use as instructed to check sugar once daily 100 each 3   amLODipine (NORVASC) 10 MG tablet Take 10 mg by mouth Daily.     Ascorbic Acid (VITAMIN C) 500 MG CAPS daily.     aspirin 81 MG tablet Take 81 mg by mouth daily.     beta carotene w/minerals (OCUVITE) tablet Take 1 tablet by mouth daily.     Blood Glucose Monitoring Suppl (ACCU-CHEK GUIDE ME) w/Device KIT Use as instructed to check sugar once daily 1 kit 0   budesonide (ENTOCORT EC) 3 MG 24 hr capsule Take by mouth daily as needed.     Cholecalciferol (VITAMIN D-3 PO) Take 2,000 Units by mouth daily.     EPINEPHrine 0.3 mg/0.3 mL IJ SOAJ injection See admin instructions.     ezetimibe (ZETIA) 10 MG tablet daily.     famotidine (PEPCID) 40 MG tablet 1 tablet at bedtime.     fexofenadine (ALLEGRA) 180 MG tablet Take 1 tablet (180 mg total) by mouth 2 (two) times daily. 60 tablet 3   Fluocinolone  Acetonide Body 0.01 % OIL APPLY OIL TOPICALLY TO AFFECTED AREA TWICE DAILY AS NEEDED (APPLY SPARINGLY)     FLUoxetine (PROZAC) 20 MG capsule Take 20 mg by mouth Daily.     fluticasone (FLONASE) 50 MCG/ACT nasal spray Place 1 spray into both nostrils daily.     Glucosamine HCl 1500 MG TABS Take 1,500 mg by mouth daily.      glucose blood (ONETOUCH VERIO) test strip USE TO CHECK YOU BLOOD SUGAR ONCE OR TWICE DAILY 200 each 3   levothyroxine (SYNTHROID, LEVOTHROID) 112 MCG tablet Take 112 mcg by mouth daily before breakfast.      losartan (COZAAR) 50 MG tablet Take 1.5 tablets (75 mg total) by mouth daily. 30 tablet 0   Magnesium 400 MG TABS daily.     metFORMIN (GLUCOPHAGE-XR) 500 MG 24 hr tablet TAKE 2 TABLETS BY MOUTH IN THE MORNING AND 1 TABLET IN THE EVENING 270 tablet 3   Multiple Vitamin (MULTIVITAMIN) capsule Take 1 capsule by mouth daily.     polyvinyl  alcohol (LIQUIFILM TEARS) 1.4 % ophthalmic solution Place 1 drop into both eyes as needed for dry eyes.     Probiotic Product (PROBIOTIC DAILY) CAPS Take 1 capsule by mouth every morning.      triamterene-hydrochlorothiazide (DYAZIDE) 37.5-25 MG per capsule Take 1 capsule by mouth Daily.      vitamin B-12 (CYANOCOBALAMIN) 100 MCG tablet daily.     Zinc 30 MG CAPS daily.     No current facility-administered medications for this visit.    Allergies:   Ace inhibitors, Atorvastatin, Beef (diagnostic), Celecoxib, Rosuvastatin, and Statins    Social History:  The patient  reports that she has never smoked. She has never used smokeless tobacco. She reports that she does not drink alcohol and does not use drugs.   Family History:  The patient's family history includes Aneurysm (age of onset: 70) in her brother; Breast cancer (age of onset: 16) in her granddaughter; Heart attack (age of onset: 93) in her brother; Heart attack (age of onset: 68) in her brother; Heart attack (age of onset: 18) in her mother; Heart disease in her sister.    ROS:  Please see the history of present illness.   Otherwise, review of systems are positive for none.   All other systems are reviewed and negative.    PHYSICAL EXAM: VS:  There were no vitals taken for this visit. , BMI There is no height or weight on file to calculate BMI. GEN: Well nourished, well developed, in no acute distress HEENT: normal Neck: no JVD, carotid bruits, or masses Cardiac: RRR; no murmurs, rubs, or gallops,no edema  Respiratory:  clear to auscultation bilaterally, normal work of breathing GI: soft, nontender, nondistended, + BS MS: no deformity or atrophy Skin: warm and dry, no rash Neuro:  Strength and sensation are intact Psych: euthymic mood, full affect   EKG:  EKG is not ordered today.    Recent Labs: 06/01/2023: ALT 28 10/29/2023: BUN 27; Creatinine, Ser 1.57; Hemoglobin 11.6; Platelets 225; Potassium 3.5; Sodium 136   Dated  08/13/21: BUN 21, creatinine 1.19. glucose 134. Electrolytes normal. CBC and D dimer normal. Dated 11/23/22: cholesterol 201, triglycerides 311, HDL 45, LDL 104   Lipid Panel    Component Value Date/Time   CHOL 210 (H) 02/12/2020 0913   CHOL 208 (H) 11/01/2015 0931   TRIG 253.0 (H) 02/12/2020 0913   TRIG 183 (H) 11/01/2015 0931   HDL 44.60 02/12/2020 0913  HDL 40 11/01/2015 0931   CHOLHDL 5 02/12/2020 0913   VLDL 50.6 (H) 02/12/2020 0913   LDLCALC 131 (H) 11/01/2015 0931   LDLDIRECT 134.0 02/12/2020 0913    Dated 11/08/20: cholesterol 206, triglycerides 363, HDL 40, LDL 104.  Dated 08/12/21: creatinine 1.19. otherwise BMET normal  Wt Readings from Last 3 Encounters:  10/29/23 144 lb (65.3 kg)  06/01/23 149 lb 6.4 oz (67.8 kg)  05/08/23 149 lb 12.8 oz (67.9 kg)      Other studies Reviewed: Additional studies/ records that were reviewed today include:   ADDENDUM REPORT: 09/12/2021 11:19   HISTORY: 83 yo female with nonspecific chest pain   EXAM: Cardiac/Coronary CTA   TECHNIQUE: The patient was scanned on a [employer].   PROTOCOL: A 120 kV prospective scan was triggered in the descending thoracic aorta at 111 HU's. Axial non-contrast 3 mm slices were carried out through the heart. The data set was analyzed on a dedicated work station and scored using the Agatson method. Gantry rotation speed was 250 msecs and collimation was .6 mm. Beta blockade and 0.8 mg of sl NTG was given. The 3D data set was reconstructed in 5% intervals of the 35-75 % of the R-R cycle. Diastolic phases were analyzed on a dedicated work station using MPR, MIP and VRT modes. The patient received 95mL OMNIPAQUE IOHEXOL 350 MG/ML SOLN of contrast.   FINDINGS: Quality: Excellent, HR 42   Coronary calcium score: The patient's coronary artery calcium score is 342, which places the patient in the 71st percentile.   Coronary arteries: Normal coronary origins.  Right dominance.   Right  Coronary Artery: Dominant. Moderate 50-69% mixed circumferential ostial stenosis (CADRADS3). No distal disease.   Left Main Coronary Artery: Minimal mixed 1-24% proximal stenosis (CADRADS1). Bifurcates into the LAD and LCx arteries.   Left Anterior Descending Coronary Artery: Anterior vessel which extends around the apex. Minimal mixed 1-24% proximal stenosis (CADRADS1). Several small diagonal branches without disease.   Left Circumflex Artery: AV groove LCx with large OM1 branch and diminutive AV groove proper vessel. Minimal to mild proximal 24-49% stenosis (CADRADS1) of the LCx. There is no disease of the OM1 branch.   Aorta: Normal size, 30 mm at the mid ascending aorta (level of the PA bifurcation) measured double oblique. Aortic atherosclerosis. No dissection.   Aortic Valve: Trileaflet. Annular calcification.   Other findings:   Normal pulmonary vein drainage into the left atrium.   Normal left atrial appendage without a thrombus.   Normal size of the pulmonary artery.   Mitral annular calcification.   IMPRESSION: 1. Moderate ostial mixed stenosis of the RCA, otherwise mild non-obstructive CAD, CADRADS = 3. Will send for FFR evaluation of the RCA.   2. Coronary calcium score of 342. This was 71st percentile for age and sex matched control.   3. Normal coronary origin with right dominance.   4. Aortic atherosclerosis, including aortic annular and mitral annular calcification.     Electronically Signed   By: Chrystie Nose M.D.   On: 09/12/2021 11:19   CT FFR ANALYSIS   CLINICAL DATA:  abnormal CT   FINDINGS: FFRct analysis was performed on the original cardiac CT angiogram dataset. Diagrammatic representation of the FFRct analysis is provided in a separate PDF document in PACS. This dictation was created using the PDF document and an interactive 3D model of the results. 3D model is not available in the EMR/PACS. Normal FFR range is >0.80.   1. Left  Main:  No significant stenosis. FFR = 0.99   2. LAD: No significant stenosis. Proximal FFR = 0.96, Mid FFR = 0.92, Distal FFR = 0.85 3. LCX: No significant stenosis. Proximal FFR = 0.94, Distal FFR = 0.90 4. RCA: No significant stenosis. Ostial FFR = 1.00, Proximal to mid FFR = 0.86, Distal FFR = 0.79   IMPRESSION: 1.  CT FFR analysis did not show significant stenosis.   2. Roadmap suggested ostial RCA to be >70% stenosed, however, I read more moderate 50-69%.   3. Aggressive medical therapy is recommended. Clinical correlation with symptoms is advised.     Electronically Signed   By: Chrystie Nose M.D.   On: 09/12/2021 14:12   ASSESSMENT AND PLAN:  1.  Chest pain and shortness of breath. Multiple cardiac risk factors including HTN, HLD, DM and family history. Coronary CTA showed nonobstructive CAD. Her symptoms are better on omeprazole. At this point just needs to focus on risk factor modification.  2. HLD. Intolerant of statins. Goal LDL < 70. Now on Zetia. Will follow up with PCP to update lipid panel and consider more aggressive lipid therapy with PCSK 9 inhibitor or inclisiran if LDL still not at goal. 3. HTN controlled 4. DM type 2 5. OSA on CPAP    Will follow up PRN  Signed, Mouna Yager [place], MD  11/02/2023 9:29 AM    [facility] [address] [place], [place], [address] Phone [phone], Fax [phone]

## 2023-11-06 ENCOUNTER — Ambulatory Visit: Payer: Medicare PPO | Attending: Cardiology | Admitting: Cardiology

## 2023-11-06 ENCOUNTER — Encounter: Payer: Self-pay | Admitting: Cardiology

## 2023-11-06 ENCOUNTER — Ambulatory Visit: Payer: Medicare PPO | Admitting: Cardiology

## 2023-11-06 VITALS — BP 124/58 | HR 61 | Ht 62.0 in | Wt 150.6 lb

## 2023-11-06 DIAGNOSIS — R0789 Other chest pain: Secondary | ICD-10-CM

## 2023-11-06 DIAGNOSIS — I10 Essential (primary) hypertension: Secondary | ICD-10-CM | POA: Diagnosis not present

## 2023-11-06 DIAGNOSIS — I2510 Atherosclerotic heart disease of native coronary artery without angina pectoris: Secondary | ICD-10-CM

## 2023-11-06 NOTE — Patient Instructions (Signed)
Medication Instructions:  Continue same medications *If you need a refill on your cardiac medications before your next appointment, please call your pharmacy*   Lab Work: None ordered   Testing/Procedures: None ordered   Follow-Up: At [facility], you and your health needs are our priority.  As part of our continuing mission to provide you with exceptional heart care, we have created designated Provider Care Teams.  These Care Teams include your primary Cardiologist (physician) and Advanced Practice Providers (APPs -  Physician Assistants and Nurse Practitioners) who all work together to provide you with the care you need, when you need it.  We recommend signing up for the patient portal called "MyChart".  Sign up information is provided on this After Visit Summary.  MyChart is used to connect with patients for Virtual Visits (Telemedicine).  Patients are able to view lab/test results, encounter notes, upcoming appointments, etc.  Non-urgent messages can be sent to your provider as well.   To learn more about what you can do with MyChart, go to [email or web address].    Your next appointment:  1 year   Call in Oct to schedule Feb appointment  ( New Office )    Provider:  Dr.Jordan

## 2023-11-06 NOTE — Progress Notes (Signed)
Cardiology Office Note   Date:  11/06/2023   ID:  Robin [name], DOB [birth year], MRN [id number]  PCP:  Robin Hazel, MD  Cardiologist:   Robin Villatoro [place], MD   Chief Complaint  Patient presents with   Shortness of Breath   Dizziness    Patient stated that she gets SOB at times when standing ans washing dishes and if she stands for a long time she gets dizzy       History of Present Illness: Robin [name] is a 83 y.o. female who is seen for follow up of chest pain and DOE. She has a history of OSA, DM, HLD, and HTN.   When seen in Dec she reported  pain in the chest and SOB. Describes the pain as a tightness in the central chest without radiation. Some SOB. Comes and goes. Thinks may be related to meals. No prior cardiac evaluation. Does have a strong family history of CAD. Intolerant to statins due to myalgias. She is still working 4 hours/day at [employer]. Lives alone.  We performed coronary CTA in 2022 which demonstrated nonobstructive CAD. Modest ostial RCA disease with normal FFR. She notes that her chest tightness has improved on omeprazole.   She was seen last March with some positional dizziness felt to be related to anemia.   She was recently seen in the ED for evaluation of 2 week history of left chest and shoulder pain. Prior to that her triamterene HCT was stopped due to concern about elevated creatinine. She was severely hypertensive in the ED with BP 218. Troponins were 15 and 19. Ecg showed no acute change. She was given clonidine and losartan was increased to 75 mg daily. CXR was normal. Shoulder films showed mod- severe DJD. Creatinine 1.57 with GFR 33. Hgb 11.6.   She was seen back by Dr Hyacinth Meeker and Triamterene HCT resumed. Over the past 2 days her BP has been great. Her chest and shoulder pain have resolved completely    Past Medical History:  Diagnosis Date   Arthritis    Colon cancer (HCC)    colon ca dx 2008 and 2010   Complication of anesthesia    problems  waking up-patient thinks she was given too much medication   Gestational diabetes    Headache    headaches all the time   Hyperlipidemia    Hypertension    Sleep apnea    uses CPAP   Urticaria     Past Surgical History:  Procedure Laterality Date   ABDOMINAL HYSTERECTOMY     APPENDECTOMY     BACK SURGERY  2009   CARPAL TUNNEL RELEASE     COLON SURGERY     COLONOSCOPY WITH PROPOFOL N/A 08/10/2015   Procedure: COLONOSCOPY WITH PROPOFOL;  Surgeon: Charolett Bumpers, MD;  Location: [facility];  Service: Endoscopy;  Laterality: N/A;   DILATION AND CURETTAGE OF UTERUS     had before had hysterectomy   ESOPHAGOGASTRODUODENOSCOPY (EGD) WITH PROPOFOL N/A 08/10/2015   Procedure: ESOPHAGOGASTRODUODENOSCOPY (EGD) WITH PROPOFOL;  Surgeon: Charolett Bumpers, MD;  Location: [facility];  Service: Endoscopy;  Laterality: N/A;   EYE SURGERY     bilateral cataract surgery    ROTATOR CUFF REPAIR       Current Outpatient Medications  Medication Sig Dispense Refill   Accu-Chek Softclix Lancets lancets Use as instructed to check sugar once daily 100 each 3   amLODipine (NORVASC) 10 MG tablet Take 10 mg by mouth Daily.  Ascorbic Acid (VITAMIN C) 500 MG CAPS daily.     aspirin 81 MG tablet Take 81 mg by mouth daily.     beta carotene w/minerals (OCUVITE) tablet Take 1 tablet by mouth daily.     Blood Glucose Monitoring Suppl (ACCU-CHEK GUIDE ME) w/Device KIT Use as instructed to check sugar once daily 1 kit 0   budesonide (ENTOCORT EC) 3 MG 24 hr capsule Take by mouth daily as needed.     Cholecalciferol (VITAMIN D-3 PO) Take 2,000 Units by mouth daily.     EPINEPHrine 0.3 mg/0.3 mL IJ SOAJ injection See admin instructions.     ezetimibe (ZETIA) 10 MG tablet daily.     famotidine (PEPCID) 40 MG tablet 1 tablet at bedtime.     fexofenadine (ALLEGRA) 180 MG tablet Take 1 tablet (180 mg total) by mouth 2 (two) times daily. 60 tablet 3   Fluocinolone Acetonide Body 0.01 % OIL APPLY OIL TOPICALLY  TO AFFECTED AREA TWICE DAILY AS NEEDED (APPLY SPARINGLY)     FLUoxetine (PROZAC) 20 MG capsule Take 20 mg by mouth Daily.     fluticasone (FLONASE) 50 MCG/ACT nasal spray Place 1 spray into both nostrils daily.     Glucosamine HCl 1500 MG TABS Take 1,500 mg by mouth daily.      glucose blood (ONETOUCH VERIO) test strip USE TO CHECK YOU BLOOD SUGAR ONCE OR TWICE DAILY 200 each 3   levothyroxine (SYNTHROID, LEVOTHROID) 112 MCG tablet Take 112 mcg by mouth daily before breakfast.      losartan (COZAAR) 50 MG tablet Take 1 tablet (50 mg total) by mouth daily.     Magnesium 400 MG TABS daily.     metFORMIN (GLUCOPHAGE-XR) 500 MG 24 hr tablet TAKE 2 TABLETS BY MOUTH IN THE MORNING AND 1 TABLET IN THE EVENING 270 tablet 3   Multiple Vitamin (MULTIVITAMIN) capsule Take 1 capsule by mouth daily.     polyvinyl alcohol (LIQUIFILM TEARS) 1.4 % ophthalmic solution Place 1 drop into both eyes as needed for dry eyes.     Probiotic Product (PROBIOTIC DAILY) CAPS Take 1 capsule by mouth every morning.      triamterene-hydrochlorothiazide (DYAZIDE) 37.5-25 MG per capsule Take 1 capsule by mouth Daily.      vitamin B-12 (CYANOCOBALAMIN) 100 MCG tablet daily.     Zinc 30 MG CAPS daily.     No current facility-administered medications for this visit.    Allergies:   Ace inhibitors, Atorvastatin, Beef (diagnostic), Celecoxib, Rosuvastatin, and Statins    Social History:  The patient  reports that she has never smoked. She has never used smokeless tobacco. She reports that she does not drink alcohol and does not use drugs.   Family History:  The patient's family history includes Aneurysm (age of onset: 34) in her brother; Breast cancer (age of onset: 82) in her granddaughter; Heart attack (age of onset: 82) in her brother; Heart attack (age of onset: 96) in her brother; Heart attack (age of onset: 25) in her mother; Heart disease in her sister.    ROS:  Please see the history of present illness.   Otherwise,  review of systems are positive for none.   All other systems are reviewed and negative.    PHYSICAL EXAM: VS:  BP (!) 124/58   Pulse 61   Ht 5\' 2"  (1.575 m)   Wt 150 lb 9.6 oz (68.3 kg)   SpO2 97%   BMI 27.55 kg/m  , BMI Body mass  index is 27.55 kg/m. GEN: Well nourished, well developed, in no acute distress HEENT: normal Neck: no JVD, carotid bruits, or masses Cardiac: RRR; no murmurs, rubs, or gallops,no edema  Respiratory:  clear to auscultation bilaterally, normal work of breathing GI: soft, nontender, nondistended, + BS MS: no deformity or atrophy Skin: warm and dry, no rash Neuro:  Strength and sensation are intact Psych: euthymic mood, full affect   EKG:  EKG is not ordered today.    Recent Labs: 06/01/2023: ALT 28 10/29/2023: BUN 27; Creatinine, Ser 1.57; Hemoglobin 11.6; Platelets 225; Potassium 3.5; Sodium 136   Dated 08/13/21: BUN 21, creatinine 1.19. glucose 134. Electrolytes normal. CBC and D dimer normal. Dated 11/23/22: cholesterol 201, triglycerides 311, HDL 45, LDL 104. Dated 05/08/23: A1c 6.4%.   Lipid Panel    Component Value Date/Time   CHOL 210 (H) 02/12/2020 0913   CHOL 208 (H) 11/01/2015 0931   TRIG 253.0 (H) 02/12/2020 0913   TRIG 183 (H) 11/01/2015 0931   HDL 44.60 02/12/2020 0913   HDL 40 11/01/2015 0931   CHOLHDL 5 02/12/2020 0913   VLDL 50.6 (H) 02/12/2020 0913   LDLCALC 131 (H) 11/01/2015 0931   LDLDIRECT 134.0 02/12/2020 0913    Dated 11/08/20: cholesterol 206, triglycerides 363, HDL 40, LDL 104.  Dated 08/12/21: creatinine 1.19. otherwise BMET normal  Wt Readings from Last 3 Encounters:  11/06/23 150 lb 9.6 oz (68.3 kg)  10/29/23 144 lb (65.3 kg)  06/01/23 149 lb 6.4 oz (67.8 kg)      Other studies Reviewed: Additional studies/ records that were reviewed today include:   ADDENDUM REPORT: 09/12/2021 11:19   HISTORY: 83 yo female with nonspecific chest pain   EXAM: Cardiac/Coronary CTA   TECHNIQUE: The patient was scanned  on a [employer].   PROTOCOL: A 120 kV prospective scan was triggered in the descending thoracic aorta at 111 HU's. Axial non-contrast 3 mm slices were carried out through the heart. The data set was analyzed on a dedicated work station and scored using the Agatson method. Gantry rotation speed was 250 msecs and collimation was .6 mm. Beta blockade and 0.8 mg of sl NTG was given. The 3D data set was reconstructed in 5% intervals of the 35-75 % of the R-R cycle. Diastolic phases were analyzed on a dedicated work station using MPR, MIP and VRT modes. The patient received 95mL OMNIPAQUE IOHEXOL 350 MG/ML SOLN of contrast.   FINDINGS: Quality: Excellent, HR 42   Coronary calcium score: The patient's coronary artery calcium score is 342, which places the patient in the 71st percentile.   Coronary arteries: Normal coronary origins.  Right dominance.   Right Coronary Artery: Dominant. Moderate 50-69% mixed circumferential ostial stenosis (CADRADS3). No distal disease.   Left Main Coronary Artery: Minimal mixed 1-24% proximal stenosis (CADRADS1). Bifurcates into the LAD and LCx arteries.   Left Anterior Descending Coronary Artery: Anterior vessel which extends around the apex. Minimal mixed 1-24% proximal stenosis (CADRADS1). Several small diagonal branches without disease.   Left Circumflex Artery: AV groove LCx with large OM1 branch and diminutive AV groove proper vessel. Minimal to mild proximal 24-49% stenosis (CADRADS1) of the LCx. There is no disease of the OM1 branch.   Aorta: Normal size, 30 mm at the mid ascending aorta (level of the PA bifurcation) measured double oblique. Aortic atherosclerosis. No dissection.   Aortic Valve: Trileaflet. Annular calcification.   Other findings:   Normal pulmonary vein drainage into the left atrium.   Normal  left atrial appendage without a thrombus.   Normal size of the pulmonary artery.   Mitral annular calcification.    IMPRESSION: 1. Moderate ostial mixed stenosis of the RCA, otherwise mild non-obstructive CAD, CADRADS = 3. Will send for FFR evaluation of the RCA.   2. Coronary calcium score of 342. This was 71st percentile for age and sex matched control.   3. Normal coronary origin with right dominance.   4. Aortic atherosclerosis, including aortic annular and mitral annular calcification.     Electronically Signed   By: Chrystie Nose M.D.   On: 09/12/2021 11:19   CT FFR ANALYSIS   CLINICAL DATA:  abnormal CT   FINDINGS: FFRct analysis was performed on the original cardiac CT angiogram dataset. Diagrammatic representation of the FFRct analysis is provided in a separate PDF document in PACS. This dictation was created using the PDF document and an interactive 3D model of the results. 3D model is not available in the EMR/PACS. Normal FFR range is >0.80.   1. Left Main:  No significant stenosis. FFR = 0.99   2. LAD: No significant stenosis. Proximal FFR = 0.96, Mid FFR = 0.92, Distal FFR = 0.85 3. LCX: No significant stenosis. Proximal FFR = 0.94, Distal FFR = 0.90 4. RCA: No significant stenosis. Ostial FFR = 1.00, Proximal to mid FFR = 0.86, Distal FFR = 0.79   IMPRESSION: 1.  CT FFR analysis did not show significant stenosis.   2. Roadmap suggested ostial RCA to be >70% stenosed, however, I read more moderate 50-69%.   3. Aggressive medical therapy is recommended. Clinical correlation with symptoms is advised.     Electronically Signed   By: Chrystie Nose M.D.   On: 09/12/2021 14:12   ASSESSMENT AND PLAN:  1.  Chest pain and shoulder pain- atypical. Suspect more musculoskeletal or related to severe HTN. Prior CTA in 2022 with nonobstructive CAD. Given recent ED evaluation I don't feel further cardiac work up needed. Focus on optimal BP control  2. HLD. Intolerant of statins. Goal LDL < 70. Now on Zetia.  3. HTN recent elevation now improved. On amlodipine, losartan  and Triamterene HCT. Would avoid rate slowing medication. If need to stop diuretics due to CKD would consider adding hydralazine. She is scheduled for Nephrology evaluation 4. DM type 2 5. OSA on CPAP    Will follow up one year  Signed, Jorgeluis Gurganus [place], MD  11/06/2023 2:48 PM    [facility] [address], [place], [place], [address] Phone [phone], Fax [phone]

## 2023-11-08 ENCOUNTER — Ambulatory Visit: Payer: Medicare PPO | Admitting: Internal Medicine

## 2023-11-08 ENCOUNTER — Encounter: Payer: Self-pay | Admitting: Internal Medicine

## 2023-11-08 VITALS — BP 120/60 | HR 63 | Ht 62.0 in | Wt 149.2 lb

## 2023-11-08 DIAGNOSIS — E785 Hyperlipidemia, unspecified: Secondary | ICD-10-CM

## 2023-11-08 DIAGNOSIS — Z7984 Long term (current) use of oral hypoglycemic drugs: Secondary | ICD-10-CM | POA: Diagnosis not present

## 2023-11-08 DIAGNOSIS — E1169 Type 2 diabetes mellitus with other specified complication: Secondary | ICD-10-CM

## 2023-11-08 DIAGNOSIS — E063 Autoimmune thyroiditis: Secondary | ICD-10-CM | POA: Diagnosis not present

## 2023-11-08 DIAGNOSIS — E1121 Type 2 diabetes mellitus with diabetic nephropathy: Secondary | ICD-10-CM

## 2023-11-08 LAB — MICROALBUMIN / CREATININE URINE RATIO
Creatinine, Urine: 75 mg/dL (ref 20–275)
Microalb Creat Ratio: 161 mg/g{creat} — ABNORMAL HIGH (ref <30)
Microalb, Ur: 12.1 mg/dL

## 2023-11-08 LAB — POCT GLYCOSYLATED HEMOGLOBIN (HGB A1C): Hemoglobin A1C: 6.6 % — AB (ref 4.0–5.6)

## 2023-11-08 MED ORDER — ONETOUCH VERIO VI STRP
ORAL_STRIP | 3 refills | Status: AC
Start: 2023-11-08 — End: ?

## 2023-11-08 NOTE — Patient Instructions (Addendum)
Please continue Metformin ER 1000 mg in am and 500 mg with dinner.  Check sugars 1x a day, rotating check times and write them down.  Continue Levothyroxine 112 mcg daily.  Take the thyroid hormone every day, with water, at least 30 minutes before breakfast, separated by at least 4 hours from: - acid reflux medications - calcium - iron - multivitamins  Please return in 6 months with your sugar log.

## 2023-11-08 NOTE — Progress Notes (Addendum)
Patient ID: Robin [name], female   DOB: 1941-09-16, 83 y.o.   MRN: [id number]  HPI: Robin [name] is a 83 y.o.-year-old female, returning for f/u for DM2, initially GDM, then DM2 dx in 05/2015, non-insulin-dependent, controlled, with complications (mild CKD, diabetic retinopathy). Last visit 6 months ago.  Interim history: No increased urination, blurry vision, nausea. She had chest pain >> started Omeprazole. She had dizziness and also some chest pain after her BP meds were changed 2/2 increased creatinine >> now back to Dyazide.  Reviewed HbA1c levels: Lab Results  Component Value Date   HGBA1C 6.4 (A) 05/08/2023   HGBA1C 6.6 (A) 10/30/2022   HGBA1C 6.6 (A) 04/27/2022  07/20/2020: HbA1c 6.6%  Pt is on a regimen of: - Metformin ER 1000 mg with dinner -she had diarrhea with higher doses >> 500 mg twice a day >> 1000 mg in am and 500 mg with dinner She did not start Januvia - $.  Pt checks her sugars 1-2x a day: - am:  141, 168 >> 153 >> n/c >> 139 >> 121-145 >> 105 - 2h after b'fast: n/c >> 74 >> 126 >> 203 >> n/c  - before lunch: 68, 75-123, 130 >> 74, 81-155, 187, 193 - 2h after lunch:  79, 86 >> 145 >> 115 >> n/c - before dinner: 83-130, 147, 196 >> 80 >> 91-140, 161 >> n/c - 2h after dinner: 120-130 >> n/c >> 110 >> 121 >> n/c - bedtime: 115, 116 >> 94  >> n/c >> 139 >> n/c - nighttime: n/c Lowest sugar was 58 >> ... 68 >> 74; she has hypoglycemia awareness in the 70s. Highest sugar was  218 >> 196 >> 161 >> 193.  Glucometer: One Touch verio  Pt's meals are: - Breakfast: cheerios + raisin bran or cornflakes + 2% milk or bacon + egg + toast - Lunch: salad or soup or sandwich - Dinner: veggies + starch - Snacks: no desserts; apple, chips She saw a dietitian 10/84/2016-this helped a lot. In 2019 had a pruritic maculopapular generalized rash. She saw PCP and dermatology and no etiology was found. She then saw Dr. Myna Hidalgo - suspicion for mastocytosis  - BM Bx negative.  She  found out she is allergic to meat.  She stopped eating meat and the rash resolved.  Of note, no alpha gal allergy.  She was also investigated for gallstones.  + Mild CKD - will see nephrology, last BUN/creatinine:  Lab Results  Component Value Date   BUN 27 (H) 10/29/2023   CREATININE 1.57 (H) 10/29/2023  No results found for: "MICRALBCREAT" Microalbumin Panel Reviewed date:11/27/2021 09:24:17 AM Interpretation:Abnormal Performing Lab: Notes/Report: Testing Performed at: [facility], [address], [place], [place] [address]  MA/CR ratio 39.1 0.0-30.0 mg/G    UMA 1.37 0.00-1.90 mg/dL    UCR 35        + HL; last set of lipids: 10/09/2023: 182/166/54.4/94   Lab Results  Component Value Date   CHOL 210 (H) 02/12/2020   HDL 44.60 02/12/2020   LDLCALC 131 (H) 11/01/2015   LDLDIRECT 134.0 02/12/2020   TRIG 253.0 (H) 02/12/2020   CHOLHDL 5 02/12/2020  She is on Zetia 10 mg daily and fish oil 1000 mg twice a day. She tried statins >> Bone pain.  She tried Benecol spread but did not like it.  She also developed a rash from fenofibrate. I suggested a referral to nutrition but she did not have this appointment.   -  last eye exam was in 12/12/2022: No DR, previously + DR. She does have drusen and hypertensive retinopathy along with glaucoma.  - No numbness and tingling in her feet.  Last foot exam 05/08/2023.  She also has HTN, OSA, depression, osteoarthritis, spinal stenosis, osteopenia, GERD, CKD stage III. + h/o colon cancer.  Hashimoto's hypothyroidism:  Pt is on levothyroxine 112 mcg daily, taken: - in am - fasting - at least 84 min from b'fast - no calcium - no iron - + multivitamins  - 1h after LT4 >> moved 4 hours after LT4 - started PPIs (Omeprazole) last week- 1st thing in am! - not on Biotin She takes Pepcid in the morning, separated by 1 to 2 hours from levothyroxine.  Reviewed latest TSH levels: 11/23/2022: TSH 0.64 11/21/2021: TSH 0.57 (0.34-4.5) Lab  Results  Component Value Date   TSH 0.57 08/24/2021   TSH 0.84 02/12/2020   TSH 2.140 03/22/2018  07/20/2020: TSH 2.22 10/22/2018: TSH 0.76  In 2019 she was found to have elevated ATA antibodies: Lab Results  Component Value Date   THGAB 93.8 (H) 03/22/2018   We checked a thyroid ultrasound since she has a prominent left thyroid lobe on palpation: Thyroid U/S (03/03/2019): no nodules, heterogeneity and atrophy  ROS: + see HPI  I reviewed pt's medications, allergies, PMH, social hx, family hx, and changes were documented in the history of present illness. Otherwise, unchanged from my initial visit note.  Past Medical History:  Diagnosis Date   Arthritis    Colon cancer (HCC)    colon ca dx 2008 and 2010   Complication of anesthesia    problems waking up-patient thinks she was given too much medication   Gestational diabetes    Headache    headaches all the time   Hyperlipidemia    Hypertension    Sleep apnea    uses CPAP   Urticaria    Past Surgical History:  Procedure Laterality Date   ABDOMINAL HYSTERECTOMY     APPENDECTOMY     BACK SURGERY  2009   CARPAL TUNNEL RELEASE     COLON SURGERY     COLONOSCOPY WITH PROPOFOL N/A 08/10/2015   Procedure: COLONOSCOPY WITH PROPOFOL;  Surgeon: Charolett Bumpers, MD;  Location: [facility];  Service: Endoscopy;  Laterality: N/A;   DILATION AND CURETTAGE OF UTERUS     had before had hysterectomy   ESOPHAGOGASTRODUODENOSCOPY (EGD) WITH PROPOFOL N/A 08/10/2015   Procedure: ESOPHAGOGASTRODUODENOSCOPY (EGD) WITH PROPOFOL;  Surgeon: Charolett Bumpers, MD;  Location: [facility];  Service: Endoscopy;  Laterality: N/A;   EYE SURGERY     bilateral cataract surgery    ROTATOR CUFF REPAIR     Social History   Socioeconomic History   Marital status: Divorced    Spouse name: Not on file   Number of children: 4   Years of education: 12+   Highest education level: Not on file  Occupational History   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  Caffeine use: Drinks coffee and tea (1 cup coffee per day). Tea rarely    Social Drivers of Corporate investment banker Strain: Not on file  Food Insecurity: Not on file  Transportation Needs: Not on file  Physical Activity: Not on file  Stress: Not on file  Social Connections: Not on file  Intimate Partner Violence: Not on file   Current Outpatient Medications on File Prior to Visit  Medication Sig Dispense Refill   Accu-Chek Softclix Lancets lancets Use as instructed to check sugar once daily 100 each 3   amLODipine (NORVASC) 10 MG tablet Take 10 mg by mouth Daily.     Ascorbic Acid (VITAMIN C) 500 MG CAPS daily.     aspirin 81 MG tablet Take 81 mg by mouth daily.     beta carotene w/minerals (OCUVITE) tablet Take 1 tablet by mouth daily.     Blood Glucose Monitoring Suppl (ACCU-CHEK GUIDE ME) w/Device KIT Use as instructed to check sugar once daily 1 kit 0   budesonide (ENTOCORT EC) 3 MG 24 hr capsule Take by mouth daily as needed.     Cholecalciferol (VITAMIN D-3 PO) Take 2,000 Units by mouth daily.     EPINEPHrine 0.3 mg/0.3 mL IJ SOAJ injection See admin instructions.     ezetimibe (ZETIA) 10 MG tablet daily.     famotidine (PEPCID) 40 MG tablet 1 tablet at bedtime.     fexofenadine (ALLEGRA) 180 MG tablet Take 1 tablet (180 mg total) by mouth 2 (two) times daily. 60 tablet 3   Fluocinolone Acetonide Body 0.01 % OIL APPLY OIL TOPICALLY TO AFFECTED AREA TWICE DAILY AS NEEDED (APPLY SPARINGLY)     FLUoxetine (PROZAC) 20 MG capsule Take 20 mg by mouth  Daily.     fluticasone (FLONASE) 50 MCG/ACT nasal spray Place 1 spray into both nostrils daily.     Glucosamine HCl 1500 MG TABS Take 1,500 mg by mouth daily.      glucose blood (ONETOUCH VERIO) test strip USE TO CHECK YOU BLOOD SUGAR ONCE OR TWICE DAILY 200 each 3   levothyroxine (SYNTHROID, LEVOTHROID) 112 MCG tablet Take 112 mcg by mouth daily before breakfast.      losartan (COZAAR) 50 MG tablet Take 1 tablet (50 mg total) by mouth daily.     Magnesium 400 MG TABS daily.     metFORMIN (GLUCOPHAGE-XR) 500 MG 24 hr tablet TAKE 2 TABLETS BY MOUTH IN THE MORNING AND 1 TABLET IN THE EVENING 270 tablet 3   Multiple Vitamin (MULTIVITAMIN) capsule Take 1 capsule by mouth daily.     polyvinyl alcohol (LIQUIFILM TEARS) 1.4 % ophthalmic solution Place 1 drop into both eyes as needed for dry eyes.     Probiotic Product (PROBIOTIC DAILY) CAPS Take 1 capsule by mouth every morning.      triamterene-hydrochlorothiazide (DYAZIDE) 37.5-25 MG per capsule Take 1 capsule by mouth Daily.      vitamin B-12 (CYANOCOBALAMIN) 100 MCG tablet daily.     Zinc 30 MG CAPS daily.     No current facility-administered medications on file prior to visit.   Allergies  Allergen Reactions   Ace Inhibitors Other (See Comments)    cough   Atorvastatin Other (See Comments)   Beef (Diagnostic) Other (See Comments)   Celecoxib Other (See Comments)   Rosuvastatin Other (See Comments)   Statins     Aches in her bone crestor and lipitor   Family History  Problem Relation Age of Onset   Heart  attack Mother 16   Heart disease Sister    Heart attack Brother 51   Heart attack Brother 20   Aneurysm Brother 69   Breast cancer Granddaughter 34   Migraines Neg Hx    PE: BP 120/60   Pulse 63   Ht 5\' 2"  (1.575 m)   Wt 149 lb 3.2 oz (67.7 kg)   SpO2 98%   BMI 27.29 kg/m   Wt Readings from Last 3 Encounters:  11/08/23 149 lb 3.2 oz (67.7 kg)  11/06/23 150 lb 9.6 oz (68.3 kg)  10/29/23 144 lb (65.3 kg)    Constitutional: Slight overweight, in NAD Eyes: EOMI, no exophthalmos ENT:  + slight left thyroid fullness, no cervical lymphadenopathy Cardiovascular: RRR, No MRG Respiratory: CTA B Musculoskeletal: + Heberden and Bouchard finger deformities Skin: no rashes Neurological: no tremor with outstretched hands  ASSESSMENT: 1. DM2, non-insulin-dependent, controlled, with long-term complications -Mild CKD -Diabetic retinopathy  2. HL  3.  Hypothyroidism due to Hashimoto's thyroiditis  PLAN:  1. Patient with history of controlled type 2 diabetes, on oral antidiabetic regimen with metformin only.  She could not increase the dose of metformin to the maximal dose due to diarrhea.  However, her HbA1c levels are controlled, latest being 6.4% at last visit.  We did not change the regimen at that time as the majority of the blood sugars were at goal. -At today's visit, sugars are mostly at goal.  She is checking before lunch only and we discussed about checking in the morning and also at bedtime occasionally to establish trends.  For now, especially in the setting of uncontrolled HbA1c (see below), I did not recommend a change in regimen.  We gave her more blood sugar logs. - I suggested to:  Patient Instructions  Please continue Metformin ER 1000 mg in am and 500 mg with dinner.  Check sugars 1x a day, rotating check times and write them down.  Continue Levothyroxine 112 mcg daily.  Take the thyroid hormone every day, with water, at least 30 minutes before breakfast, separated by at least 4 hours from: - acid reflux medications - calcium - iron - multivitamins  Please return in 6 months with your sugar log.   - we checked her HbA1c: 6.6% (slightly higher) - advised to check sugars at different times of the day - 1x a day, rotating check times - advised for yearly eye exams >> she is UTD - return to clinic in 6 months  2. HL - Reviewed latest lipid panel from 09/2023: Triglycerides  now at goal, decreased from the 300s, LDL also lower, under 100. -She not tolerate statins and had a rash with fenofibrate.  She is on Zetia 10 mg daily and fish oil 1000 mg twice a day   3.  Hashimoto's hypothyroidism -In the past, her left thyroid lobe appeared more prominent on palpation so we checked a thyroid ultrasound.  It was negative for nodules. - latest thyroid labs reviewed with pt. >> normal in 10/2022 - she continues on LT4 112 mcg daily - pt feels good on this dose. - we discussed about taking the thyroid hormone every day, with water, >30 minutes before breakfast, separated by >4 hours from acid reflux medications, calcium, iron, multivitamins. Pt. Is not taking it correctly.  She started omeprazole last week and she is taking this first thing in the morning, before levothyroxine.  I advised her to move it 4 hours later at least.  We will need to check  her thyroid test again at next visit.  Component     Latest Ref Rng 11/08/2023  Creatinine, Urine     20 - 275 mg/dL 75   Microalb, Ur     mg/dL 16.1   MICROALB/CREAT RATIO     <30 mg/g creat 161 (H)   ACR is elevated, higher than before.  Patient is planning to see nephrology.  Carlus Pavlov, MD PhD [facility] Endocrinology

## 2023-11-26 DIAGNOSIS — K219 Gastro-esophageal reflux disease without esophagitis: Secondary | ICD-10-CM | POA: Diagnosis not present

## 2023-11-26 DIAGNOSIS — N1832 Chronic kidney disease, stage 3b: Secondary | ICD-10-CM | POA: Diagnosis not present

## 2023-11-26 DIAGNOSIS — I129 Hypertensive chronic kidney disease with stage 1 through stage 4 chronic kidney disease, or unspecified chronic kidney disease: Secondary | ICD-10-CM | POA: Diagnosis not present

## 2023-11-26 DIAGNOSIS — Z6827 Body mass index (BMI) 27.0-27.9, adult: Secondary | ICD-10-CM | POA: Diagnosis not present

## 2023-11-26 DIAGNOSIS — E1122 Type 2 diabetes mellitus with diabetic chronic kidney disease: Secondary | ICD-10-CM | POA: Diagnosis not present

## 2023-11-26 DIAGNOSIS — R112 Nausea with vomiting, unspecified: Secondary | ICD-10-CM | POA: Diagnosis not present

## 2023-11-26 DIAGNOSIS — Z0000 Encounter for general adult medical examination without abnormal findings: Secondary | ICD-10-CM | POA: Diagnosis not present

## 2023-11-26 DIAGNOSIS — R42 Dizziness and giddiness: Secondary | ICD-10-CM | POA: Diagnosis not present

## 2023-11-26 DIAGNOSIS — M25512 Pain in left shoulder: Secondary | ICD-10-CM | POA: Diagnosis not present

## 2023-11-26 DIAGNOSIS — E039 Hypothyroidism, unspecified: Secondary | ICD-10-CM | POA: Diagnosis not present

## 2023-11-29 ENCOUNTER — Inpatient Hospital Stay: Payer: Medicare PPO | Attending: Hematology & Oncology

## 2023-11-29 ENCOUNTER — Inpatient Hospital Stay: Payer: Medicare PPO | Admitting: Medical Oncology

## 2023-11-29 ENCOUNTER — Encounter: Payer: Self-pay | Admitting: Medical Oncology

## 2023-11-29 VITALS — BP 121/51 | HR 56 | Temp 98.3°F | Resp 18 | Ht 62.0 in | Wt 148.8 lb

## 2023-11-29 DIAGNOSIS — Z85038 Personal history of other malignant neoplasm of large intestine: Secondary | ICD-10-CM | POA: Insufficient documentation

## 2023-11-29 DIAGNOSIS — C184 Malignant neoplasm of transverse colon: Secondary | ICD-10-CM | POA: Diagnosis not present

## 2023-11-29 DIAGNOSIS — D508 Other iron deficiency anemias: Secondary | ICD-10-CM | POA: Diagnosis not present

## 2023-11-29 DIAGNOSIS — C185 Malignant neoplasm of splenic flexure: Secondary | ICD-10-CM | POA: Diagnosis not present

## 2023-11-29 DIAGNOSIS — E871 Hypo-osmolality and hyponatremia: Secondary | ICD-10-CM | POA: Diagnosis not present

## 2023-11-29 DIAGNOSIS — C183 Malignant neoplasm of hepatic flexure: Secondary | ICD-10-CM

## 2023-11-29 DIAGNOSIS — R5383 Other fatigue: Secondary | ICD-10-CM | POA: Insufficient documentation

## 2023-11-29 DIAGNOSIS — R42 Dizziness and giddiness: Secondary | ICD-10-CM | POA: Insufficient documentation

## 2023-11-29 DIAGNOSIS — D649 Anemia, unspecified: Secondary | ICD-10-CM | POA: Insufficient documentation

## 2023-11-29 DIAGNOSIS — R112 Nausea with vomiting, unspecified: Secondary | ICD-10-CM | POA: Insufficient documentation

## 2023-11-29 DIAGNOSIS — R519 Headache, unspecified: Secondary | ICD-10-CM | POA: Insufficient documentation

## 2023-11-29 DIAGNOSIS — Z9221 Personal history of antineoplastic chemotherapy: Secondary | ICD-10-CM | POA: Insufficient documentation

## 2023-11-29 DIAGNOSIS — E119 Type 2 diabetes mellitus without complications: Secondary | ICD-10-CM | POA: Diagnosis not present

## 2023-11-29 DIAGNOSIS — R197 Diarrhea, unspecified: Secondary | ICD-10-CM | POA: Insufficient documentation

## 2023-11-29 LAB — CMP (CANCER CENTER ONLY)
ALT: 19 U/L (ref 0–44)
AST: 14 U/L — ABNORMAL LOW (ref 15–41)
Albumin: 4.6 g/dL (ref 3.5–5.0)
Alkaline Phosphatase: 37 U/L — ABNORMAL LOW (ref 38–126)
Anion gap: 10 (ref 5–15)
BUN: 18 mg/dL (ref 8–23)
CO2: 26 mmol/L (ref 22–32)
Calcium: 10.1 mg/dL (ref 8.9–10.3)
Chloride: 89 mmol/L — ABNORMAL LOW (ref 98–111)
Creatinine: 1.25 mg/dL — ABNORMAL HIGH (ref 0.44–1.00)
GFR, Estimated: 43 mL/min — ABNORMAL LOW (ref >60)
Glucose, Bld: 115 mg/dL — ABNORMAL HIGH (ref 70–99)
Potassium: 3.7 mmol/L (ref 3.5–5.1)
Sodium: 125 mmol/L — ABNORMAL LOW (ref 135–145)
Total Bilirubin: 0.7 mg/dL (ref 0.0–1.2)
Total Protein: 6.8 g/dL (ref 6.5–8.1)

## 2023-11-29 LAB — CBC WITH DIFFERENTIAL (CANCER CENTER ONLY)
Abs Immature Granulocytes: 0.03 10*3/uL (ref 0.00–0.07)
Basophils Absolute: 0 10*3/uL (ref 0.0–0.1)
Basophils Relative: 0 %
Eosinophils Absolute: 0.3 10*3/uL (ref 0.0–0.5)
Eosinophils Relative: 4 %
HCT: 30.5 % — ABNORMAL LOW (ref 36.0–46.0)
Hemoglobin: 11.2 g/dL — ABNORMAL LOW (ref 12.0–15.0)
Immature Granulocytes: 0 %
Lymphocytes Relative: 38 %
Lymphs Abs: 2.9 10*3/uL (ref 0.7–4.0)
MCH: 31.6 pg (ref 26.0–34.0)
MCHC: 36.7 g/dL — ABNORMAL HIGH (ref 30.0–36.0)
MCV: 86.2 fL (ref 80.0–100.0)
Monocytes Absolute: 0.7 10*3/uL (ref 0.1–1.0)
Monocytes Relative: 9 %
Neutro Abs: 3.6 10*3/uL (ref 1.7–7.7)
Neutrophils Relative %: 49 %
Platelet Count: 225 10*3/uL (ref 150–400)
RBC: 3.54 MIL/uL — ABNORMAL LOW (ref 3.87–5.11)
RDW: 11.5 % (ref 11.5–15.5)
WBC Count: 7.5 10*3/uL (ref 4.0–10.5)
nRBC: 0 % (ref 0.0–0.2)

## 2023-11-29 LAB — RETICULOCYTES
Immature Retic Fract: 5.5 % (ref 2.3–15.9)
RBC.: 3.55 MIL/uL — ABNORMAL LOW (ref 3.87–5.11)
Retic Count, Absolute: 52.5 10*3/uL (ref 19.0–186.0)
Retic Ct Pct: 1.5 % (ref 0.4–3.1)

## 2023-11-29 LAB — IRON AND IRON BINDING CAPACITY (CC-WL,HP ONLY)
Iron: 109 ug/dL (ref 28–170)
Saturation Ratios: 30 % (ref 10.4–31.8)
TIBC: 365 ug/dL (ref 250–450)
UIBC: 256 ug/dL (ref 148–442)

## 2023-11-29 LAB — FERRITIN: Ferritin: 221 ng/mL (ref 11–307)

## 2023-11-29 NOTE — Progress Notes (Signed)
 Hematology and Oncology Follow Up Visit  NIKYLA NAVEDO [id number] [birth year] 83 y.o. 11/29/2023   Principle Diagnosis:  1) history of locally recurrent adenocarcinoma of the colon (2000) 2) iron deficiency anemia  Current Therapy:   Observation  IV iron- Venofer- last dose given on 12/25/2022  Previous Therapy: Colon Resection- 2000 FOLFOX chemotherapy    Interim History:  Ms. Nembhard is here today for a 62-month follow-up.  She is here with her daughter   She reports that for the past 2-3 months she has had fatigue, occasional dizziness, diarrhea. Her PCP suspected metformin to be the cause and they stopped this yesterday. She also has been having headaches. No double vision. No head injuries.   No melena or hematochezia. No abdominal pains. She does report nausea and occasional vomiting for the past month. She is taking dramamine which has helped some.  Yesterday she had a lot of vomiting and diarrhea. Denies chest pain and shortness of breath.  She is UTD on seeing GI with last colonoscopy being last year and was normal per patient. She is seen by Dr. Amado Coe at [facility]. She is due for another colonoscopy in 2-3 years per patient.   Appetite is low.  Wt Readings from Last 3 Encounters:  11/29/23 148 lb 12.8 oz (67.5 kg)  11/08/23 149 lb 3.2 oz (67.7 kg)  11/06/23 150 lb 9.6 oz (68.3 kg)    Medications:  Allergies as of 11/29/2023       Reactions   Beef (diagnostic) Hives   Atorvastatin Other (See Comments)   Celecoxib Other (See Comments)   Rosuvastatin Other (See Comments)   Ace Inhibitors Cough   Statins    Aches in her bone crestor and lipitor        Medication List        Accurate as of November 29, 2023 12:45 PM. If you have any questions, ask your nurse or doctor.          amLODipine 10 MG tablet Commonly known as: NORVASC Take 10 mg by mouth Daily.   aspirin 81 MG tablet Take 81 mg by mouth daily.   beta carotene w/minerals tablet Take  1 tablet by mouth daily.   budesonide 3 MG 24 hr capsule Commonly known as: ENTOCORT EC Take by mouth daily as needed.   EPINEPHrine 0.3 mg/0.3 mL Soaj injection Commonly known as: EPI-PEN See admin instructions.   ezetimibe 10 MG tablet Commonly known as: ZETIA daily.   famotidine 40 MG tablet Commonly known as: PEPCID 1 tablet at bedtime.   fexofenadine 180 MG tablet Commonly known as: ALLEGRA Take 1 tablet (180 mg total) by mouth 2 (two) times daily.   Fluocinolone Acetonide Body 0.01 % Oil APPLY OIL TOPICALLY TO AFFECTED AREA TWICE DAILY AS NEEDED (APPLY SPARINGLY)   FLUoxetine 20 MG capsule Commonly known as: PROZAC Take 20 mg by mouth Daily.   fluticasone 50 MCG/ACT nasal spray Commonly known as: FLONASE Place 1 spray into both nostrils daily.   Glucosamine HCl 1500 MG Tabs Take 1,500 mg by mouth daily.   levothyroxine 112 MCG tablet Commonly known as: SYNTHROID Take 112 mcg by mouth daily before breakfast.   losartan 50 MG tablet Commonly known as: COZAAR Take 1 tablet (50 mg total) by mouth daily.   Magnesium 400 MG Tabs daily.   metFORMIN 500 MG 24 hr tablet Commonly known as: GLUCOPHAGE-XR TAKE 2 TABLETS BY MOUTH IN THE MORNING AND 1 TABLET IN THE EVENING  multivitamin capsule Take 1 capsule by mouth daily.   OneTouch Verio test strip Generic drug: glucose blood USE TO CHECK YOU BLOOD SUGAR ONCE OR TWICE DAILY   polyvinyl alcohol 1.4 % ophthalmic solution Commonly known as: LIQUIFILM TEARS Place 1 drop into both eyes as needed for dry eyes.   Probiotic Daily Caps Take 1 capsule by mouth every morning.   triamterene-hydrochlorothiazide 37.5-25 MG capsule Commonly known as: DYAZIDE Take 1 capsule by mouth Daily.   vitamin B-12 100 MCG tablet Commonly known as: CYANOCOBALAMIN daily.   Vitamin C 500 MG Caps daily.   VITAMIN D-3 PO Take 2,000 Units by mouth daily.   Zinc 30 MG Caps daily.        Allergies:  Allergies   Allergen Reactions   Beef (Diagnostic) Hives   Atorvastatin Other (See Comments)   Celecoxib Other (See Comments)   Rosuvastatin Other (See Comments)   Ace Inhibitors Cough   Statins     Aches in her bone crestor and lipitor    Past Medical History, Surgical history, Social history, and Family History were reviewed and updated.  Review of Systems: Review of Systems  Constitutional: Negative.   HENT: Negative.    Eyes: Negative.   Respiratory: Negative.    Cardiovascular: Negative.   Gastrointestinal:  Positive for diarrhea. Negative for constipation.  Genitourinary: Negative.   Musculoskeletal: Negative.   Skin: Negative.   Endo/Heme/Allergies: Negative.   Psychiatric/Behavioral: Negative.      Physical Exam:  height is 5\' 2"  (1.575 m) and weight is 148 lb 12.8 oz (67.5 kg). Her oral temperature is 98.3 F (36.8 C). Her blood pressure is 121/51 (abnormal) and her pulse is 56 (abnormal). Her respiration is 18 and oxygen saturation is 100%.  ECOG performance status: 1  Wt Readings from Last 3 Encounters:  11/29/23 148 lb 12.8 oz (67.5 kg)  11/08/23 149 lb 3.2 oz (67.7 kg)  11/06/23 150 lb 9.6 oz (68.3 kg)    Physical Exam Vitals reviewed.  HENT:     Head: Normocephalic and atraumatic.  Cardiovascular:     Rate and Rhythm: Normal rate and regular rhythm.     Heart sounds: Normal heart sounds.  Pulmonary:     Effort: Pulmonary effort is normal.     Breath sounds: Normal breath sounds.  Abdominal:     General: Bowel sounds are normal.     Palpations: Abdomen is soft.  Musculoskeletal:        General: No tenderness or deformity. Normal range of motion.     Cervical back: Normal range of motion.  Lymphadenopathy:     Cervical: No cervical adenopathy.  Skin:    General: Skin is warm and dry.     Findings: No erythema or rash.  Neurological:     Mental Status: She is alert and oriented to person, place, and time.  Psychiatric:        Behavior: Behavior normal.         Thought Content: Thought content normal.        Judgment: Judgment normal.      Lab Results  Component Value Date   WBC 7.5 11/29/2023   HGB 11.2 (L) 11/29/2023   HCT 30.5 (L) 11/29/2023   MCV 86.2 11/29/2023   PLT 225 11/29/2023   Lab Results  Component Value Date   FERRITIN 190 06/01/2023   IRON 104 06/01/2023   TIBC 379 06/01/2023   UIBC 275 06/01/2023   IRONPCTSAT 27 06/01/2023   Lab  Results  Component Value Date   RETICCTPCT 1.5 11/29/2023   RBC 3.55 (L) 11/29/2023   RBC 3.54 (L) 11/29/2023   RETICCTABS 48.7 12/17/2008   No results found for: "KPAFRELGTCHN", "LAMBDASER", "KAPLAMBRATIO" No results found for: "IGGSERUM", "IGA", "IGMSERUM" No results found for: "TOTALPROTELP", "ALBUMINELP", "A1GS", "A2GS", "BETS", "BETA2SER", "GAMS", "MSPIKE", "SPEI"   Chemistry      Component Value Date/Time   NA 125 (L) 11/29/2023 1159   NA 142 03/22/2018 1059   NA 139 05/21/2017 0838   K 3.7 11/29/2023 1159   K 4.3 05/21/2017 0838   CL 89 (L) 11/29/2023 1159   CL 102 05/18/2016 1505   CL 100 11/01/2015 0932   CO2 26 11/29/2023 1159   CO2 28 05/21/2017 0838   BUN 18 11/29/2023 1159   BUN 13 03/22/2018 1059   BUN 20.8 05/21/2017 0838   CREATININE 1.25 (H) 11/29/2023 1159   CREATININE 1.0 05/21/2017 0838      Component Value Date/Time   CALCIUM 10.1 11/29/2023 1159   CALCIUM 10.5 (H) 05/21/2017 0838   ALKPHOS 37 (L) 11/29/2023 1159   ALKPHOS 49 05/21/2017 0838   AST 14 (L) 11/29/2023 1159   AST 22 05/21/2017 0838   ALT 19 11/29/2023 1159   ALT 33 05/21/2017 0838   BILITOT 0.7 11/29/2023 1159   BILITOT 0.57 05/21/2017 0838     No diagnosis found.  Impression and Plan: Ms. Timme is a very pleasant 83 yo caucasian female with history of locally recurrent adenocarcinoma of the colon with resection in 2000. She received adjuvant FOLFOX.  I see no evidence of recurrent disease.  She is up-to-date with her colonoscopy.  CBC and CMP reviewed with the patient today.   I am concerned about her symptoms and subsequent abruptly low sodium level. She does not feel dehydrated. According to patient there have been many medication adjustments recently which may be playing a role. They will contact her PCP today for further advisement. If she does not hear back I would suggest ER visit for further evaluation as she has some red flag signs and symptoms.   She has mild anemia but improved from her last CBC performed 6 months ago.  Iron studies are pending and we will contact her with the results to let her know if she needs any additional IV iron.  RTC 6 months MD, labs (CBC w/, CMP, CEA, ferritin, iron, retic)  Brand Males [place], PA-C 3/6/202512:45 PM

## 2023-11-30 DIAGNOSIS — E876 Hypokalemia: Secondary | ICD-10-CM | POA: Diagnosis not present

## 2023-12-03 DIAGNOSIS — E876 Hypokalemia: Secondary | ICD-10-CM | POA: Diagnosis not present

## 2023-12-03 LAB — LAB REPORT - SCANNED
Calcium: 9.8
EGFR: 44

## 2023-12-04 DIAGNOSIS — M19012 Primary osteoarthritis, left shoulder: Secondary | ICD-10-CM | POA: Diagnosis not present

## 2023-12-05 ENCOUNTER — Telehealth: Payer: Self-pay

## 2023-12-05 ENCOUNTER — Other Ambulatory Visit (HOSPITAL_COMMUNITY): Payer: Self-pay

## 2023-12-05 ENCOUNTER — Encounter: Payer: Self-pay | Admitting: Hematology & Oncology

## 2023-12-05 NOTE — Telephone Encounter (Signed)
 Pharmacy Patient Advocate Encounter   Received notification from Pt Calls Messages that prior authorization for Onetouch verio strips is required/requested.   Insurance verification completed.   The patient is insured through [employer] .   Per test claim: PA required; PA started via CoverMyMeds. KEY  [id number] . Waiting for clinical questions to populate.   PA cancelled by insurance: There is an existing case within the [facility] environment that has the same patient, prescriber, and drug. This case must be finalized before proceeding with similar requests.

## 2023-12-05 NOTE — Telephone Encounter (Signed)
 Patient needs a Urgent PA for OneTouch Test Strips. She can not use Accu-Chek

## 2023-12-05 NOTE — Telephone Encounter (Signed)
 Patient called stating that her PCP took her off of metformin and wanted to let us know but also she needs a PA for OneTouch Test Strips. (Message has been sent to the PA team)  I did advise to the patient that since her PCP took her off she would need to contact them about when to start back since they are running lab test for her Sodium and etc since the patient was having some dizziness and other symptoms.    FYI in Dr. Elvera Lennox absence.,

## 2023-12-10 NOTE — Telephone Encounter (Signed)
 The PAHub is directly through the insurance- this was not started by anyone on our team. If the patient started this process they must call and cancel the request for [place] to continue.

## 2023-12-10 NOTE — Telephone Encounter (Signed)
 J, I reviewed her kidney function and this appears to have improved.  We could start back a low-dose of metformin, 500 mg with dinner, if she agrees.

## 2023-12-11 ENCOUNTER — Telehealth: Payer: Self-pay

## 2023-12-11 NOTE — Telephone Encounter (Signed)
-----   Message from Carlus Pavlov sent at 12/10/2023 10:22 AM EDT ----- J, I just saw the patient's latest GFR which is 32, per records from PCP from 11/26/2023.  Let's not start back metformin.  Please check the blood sugars consistently and let us know how they are doing in the next 3 days. Ty! C ----- Message ----- From: Samuella Bruin Sent: 12/07/2023  11:19 AM EDT To: Carlus Pavlov, MD; [facility] Clinical Pool  Progress Notes

## 2023-12-11 NOTE — Telephone Encounter (Signed)
 I called and spoke with the pt and she was able to get them.

## 2023-12-11 NOTE — Telephone Encounter (Signed)
 Pt has been notified and voices understanding.

## 2023-12-12 DIAGNOSIS — H04123 Dry eye syndrome of bilateral lacrimal glands: Secondary | ICD-10-CM | POA: Diagnosis not present

## 2023-12-12 DIAGNOSIS — H1045 Other chronic allergic conjunctivitis: Secondary | ICD-10-CM | POA: Diagnosis not present

## 2023-12-12 DIAGNOSIS — E119 Type 2 diabetes mellitus without complications: Secondary | ICD-10-CM | POA: Diagnosis not present

## 2023-12-12 DIAGNOSIS — H40013 Open angle with borderline findings, low risk, bilateral: Secondary | ICD-10-CM | POA: Diagnosis not present

## 2023-12-12 LAB — HM DIABETES EYE EXAM

## 2023-12-12 NOTE — Telephone Encounter (Signed)
 Pharmacy Patient Advocate Encounter  Received notification from [facility] that Prior Authorization for Onetouch verio strips  has been APPROVED from 12/07/2023 to 09/24/2024   PA #/Case ID/Reference #: [id number]

## 2023-12-13 DIAGNOSIS — M19012 Primary osteoarthritis, left shoulder: Secondary | ICD-10-CM | POA: Diagnosis not present

## 2023-12-14 ENCOUNTER — Other Ambulatory Visit: Payer: Self-pay | Admitting: Orthopaedic Surgery

## 2023-12-14 DIAGNOSIS — G8929 Other chronic pain: Secondary | ICD-10-CM

## 2023-12-17 ENCOUNTER — Telehealth: Payer: Self-pay

## 2023-12-17 ENCOUNTER — Other Ambulatory Visit: Payer: Self-pay | Admitting: Internal Medicine

## 2023-12-17 MED ORDER — EMPAGLIFLOZIN 10 MG PO TABS: 10.0000 mg | ORAL_TABLET | Freq: Every day | ORAL | 3 refills | Status: AC

## 2023-12-17 NOTE — Telephone Encounter (Signed)
I called and left a detailed VM

## 2023-12-17 NOTE — Telephone Encounter (Signed)
 Patient called stating that she is still off of metformin  Her blood sugar 243 Yesterday and then other days it has been running in the 160s She would like to know how to proceed?

## 2023-12-17 NOTE — Telephone Encounter (Signed)
 J, I sent a prescription for low-dose Jardiance to [facility].  This can help with both blood sugars and the urine proteins.  Please advise her to take it before breakfast.  We may need a PA if not covered.

## 2023-12-24 ENCOUNTER — Ambulatory Visit
Admission: RE | Admit: 2023-12-24 | Discharge: 2023-12-24 | Disposition: A | Discharge status: Home / Self Care | Source: Ambulatory Visit | Attending: Orthopaedic Surgery | Admitting: Orthopaedic Surgery

## 2023-12-24 DIAGNOSIS — R42 Dizziness and giddiness: Secondary | ICD-10-CM | POA: Diagnosis not present

## 2023-12-24 DIAGNOSIS — E1122 Type 2 diabetes mellitus with diabetic chronic kidney disease: Secondary | ICD-10-CM | POA: Diagnosis not present

## 2023-12-24 DIAGNOSIS — M24012 Loose body in left shoulder: Secondary | ICD-10-CM | POA: Diagnosis not present

## 2023-12-24 DIAGNOSIS — I129 Hypertensive chronic kidney disease with stage 1 through stage 4 chronic kidney disease, or unspecified chronic kidney disease: Secondary | ICD-10-CM | POA: Diagnosis not present

## 2023-12-24 DIAGNOSIS — N1832 Chronic kidney disease, stage 3b: Secondary | ICD-10-CM | POA: Diagnosis not present

## 2023-12-24 DIAGNOSIS — R634 Abnormal weight loss: Secondary | ICD-10-CM | POA: Diagnosis not present

## 2023-12-24 DIAGNOSIS — M19012 Primary osteoarthritis, left shoulder: Secondary | ICD-10-CM | POA: Diagnosis not present

## 2023-12-24 DIAGNOSIS — M25462 Effusion, left knee: Secondary | ICD-10-CM | POA: Diagnosis not present

## 2023-12-24 DIAGNOSIS — R110 Nausea: Secondary | ICD-10-CM | POA: Diagnosis not present

## 2023-12-24 DIAGNOSIS — G8929 Other chronic pain: Secondary | ICD-10-CM

## 2023-12-24 DIAGNOSIS — M75122 Complete rotator cuff tear or rupture of left shoulder, not specified as traumatic: Secondary | ICD-10-CM | POA: Diagnosis not present

## 2023-12-25 ENCOUNTER — Other Ambulatory Visit: Payer: Self-pay | Admitting: Family Medicine

## 2023-12-25 DIAGNOSIS — I129 Hypertensive chronic kidney disease with stage 1 through stage 4 chronic kidney disease, or unspecified chronic kidney disease: Secondary | ICD-10-CM | POA: Diagnosis not present

## 2023-12-25 DIAGNOSIS — D649 Anemia, unspecified: Secondary | ICD-10-CM | POA: Diagnosis not present

## 2023-12-25 DIAGNOSIS — C189 Malignant neoplasm of colon, unspecified: Secondary | ICD-10-CM | POA: Diagnosis not present

## 2023-12-25 DIAGNOSIS — R110 Nausea: Secondary | ICD-10-CM

## 2023-12-25 DIAGNOSIS — N1832 Chronic kidney disease, stage 3b: Secondary | ICD-10-CM | POA: Diagnosis not present

## 2023-12-25 DIAGNOSIS — E871 Hypo-osmolality and hyponatremia: Secondary | ICD-10-CM | POA: Diagnosis not present

## 2023-12-25 DIAGNOSIS — N390 Urinary tract infection, site not specified: Secondary | ICD-10-CM | POA: Diagnosis not present

## 2023-12-25 DIAGNOSIS — E785 Hyperlipidemia, unspecified: Secondary | ICD-10-CM | POA: Diagnosis not present

## 2023-12-25 DIAGNOSIS — E049 Nontoxic goiter, unspecified: Secondary | ICD-10-CM | POA: Diagnosis not present

## 2023-12-27 ENCOUNTER — Encounter: Payer: Self-pay | Admitting: Nephrology

## 2024-01-04 DIAGNOSIS — D225 Melanocytic nevi of trunk: Secondary | ICD-10-CM | POA: Diagnosis not present

## 2024-01-04 DIAGNOSIS — L821 Other seborrheic keratosis: Secondary | ICD-10-CM | POA: Diagnosis not present

## 2024-01-04 DIAGNOSIS — L910 Hypertrophic scar: Secondary | ICD-10-CM | POA: Diagnosis not present

## 2024-01-04 DIAGNOSIS — L818 Other specified disorders of pigmentation: Secondary | ICD-10-CM | POA: Diagnosis not present

## 2024-01-04 DIAGNOSIS — L578 Other skin changes due to chronic exposure to nonionizing radiation: Secondary | ICD-10-CM | POA: Diagnosis not present

## 2024-01-04 DIAGNOSIS — Z86018 Personal history of other benign neoplasm: Secondary | ICD-10-CM | POA: Diagnosis not present

## 2024-01-04 DIAGNOSIS — Z85828 Personal history of other malignant neoplasm of skin: Secondary | ICD-10-CM | POA: Diagnosis not present

## 2024-01-04 DIAGNOSIS — L720 Epidermal cyst: Secondary | ICD-10-CM | POA: Diagnosis not present

## 2024-01-04 DIAGNOSIS — D2372 Other benign neoplasm of skin of left lower limb, including hip: Secondary | ICD-10-CM | POA: Diagnosis not present

## 2024-01-08 ENCOUNTER — Other Ambulatory Visit: Payer: Self-pay | Admitting: Family Medicine

## 2024-01-08 DIAGNOSIS — Z1231 Encounter for screening mammogram for malignant neoplasm of breast: Secondary | ICD-10-CM

## 2024-01-10 DIAGNOSIS — N1832 Chronic kidney disease, stage 3b: Secondary | ICD-10-CM | POA: Diagnosis not present

## 2024-01-14 ENCOUNTER — Other Ambulatory Visit: Payer: Self-pay | Admitting: Nephrology

## 2024-01-14 DIAGNOSIS — N1832 Chronic kidney disease, stage 3b: Secondary | ICD-10-CM

## 2024-01-15 ENCOUNTER — Other Ambulatory Visit: Payer: Self-pay | Admitting: Family Medicine

## 2024-01-15 ENCOUNTER — Ambulatory Visit
Admission: RE | Admit: 2024-01-15 | Discharge: 2024-01-15 | Discharge status: Home / Self Care | Source: Ambulatory Visit | Attending: Family Medicine | Admitting: Family Medicine

## 2024-01-15 DIAGNOSIS — R110 Nausea: Secondary | ICD-10-CM | POA: Diagnosis not present

## 2024-01-18 DIAGNOSIS — G4733 Obstructive sleep apnea (adult) (pediatric): Secondary | ICD-10-CM | POA: Diagnosis not present

## 2024-01-22 ENCOUNTER — Telehealth: Payer: Self-pay

## 2024-01-22 NOTE — Telephone Encounter (Signed)
 Dr. Swaziland,  Mr. Zervas 83 year old female is requesting preoperative cardiac evaluation for left reverse total shoulder arthroplasty.  She was seen in clinic by you on 11/06/2023.  She had been seen and evaluated in the emergency department for left chest pain and shoulder pain.  Her blood pressure medication was adjusted.  Her shoulder x-ray showed moderate-severe degenerative joint disease.  On follow-up her blood pressure was well-controlled.  Follow-up was planned for 1 year.  Her PMH includes atypical chest pain, nonocclusive CAD, primary hypertension  May she hold her aspirin prior to her surgery?  Would you be able to comment on cardiac risk for her upcoming procedure?  Please direct your response to CV DIV preop pool.  Thank you for your help.  Chet Cota. Marwah Disbro NP-C     01/22/2024, 3:14 PM [facility] [address] Office [phone] Fax [phone]

## 2024-01-22 NOTE — Telephone Encounter (Signed)
   Pre-operative Risk Assessment    Patient Name: Robin [name]  DOB: [birth year] MRN: [id number]   Date of last office visit: 11/06/2023, Dr. Peter Swaziland Date of next office visit: NONE   Request for Surgical Clearance    Procedure:   Left Reverse total shoulder arthroplasty  Date of Surgery:  Clearance TBD                                Surgeon: Dr. Grafton Lawrence, MD Surgeon's Group or Practice Name: [facility] Phone number: [phone] Fax number: [phone]   Type of Clearance Requested:   - Medical  - Pharmacy:  Hold Aspirin     Type of Anesthesia:  General    Additional requests/questions:    SignedDelroy Fields   01/22/2024, 2:02 PM

## 2024-01-23 NOTE — Telephone Encounter (Signed)
   Patient Name: Robin [name]  DOB: 10/10/1940 MRN: [id number]  Primary Cardiologist: Peter Swaziland, MD  Chart reviewed as part of pre-operative protocol coverage. Previous APP had reached out to Dr. Swaziland for input on surgical clearance given last visit. Per Dr. Christophe Cram reply, "Based on my evaluation and prior CTA in 2022 I think her CV risk is fairly low. Overall risk is moderate mainly from age standpoint. It is safe to hold ASA prior to surgery."  Will route this bundled recommendation to requesting provider via Epic fax function. Please call with questions.  Karma Ansley N Karrah Mangini, PA-C 01/23/2024, 3:38 PM

## 2024-01-24 ENCOUNTER — Ambulatory Visit (INDEPENDENT_AMBULATORY_CARE_PROVIDER_SITE_OTHER)

## 2024-01-24 DIAGNOSIS — E1121 Type 2 diabetes mellitus with diabetic nephropathy: Secondary | ICD-10-CM | POA: Diagnosis not present

## 2024-01-24 LAB — POCT GLYCOSYLATED HEMOGLOBIN (HGB A1C): Hemoglobin A1C: 7.1 % — AB (ref 4.0–5.6)

## 2024-01-24 NOTE — Progress Notes (Signed)
 Pt came in to have A1c checked to make sure she has been cleared for surgery.

## 2024-01-28 ENCOUNTER — Encounter: Payer: Self-pay | Admitting: Nephrology

## 2024-02-04 ENCOUNTER — Ambulatory Visit
Admission: RE | Admit: 2024-02-04 | Discharge: 2024-02-04 | Disposition: A | Discharge status: Home / Self Care | Source: Ambulatory Visit | Attending: Nephrology | Admitting: Nephrology

## 2024-02-04 DIAGNOSIS — N1832 Chronic kidney disease, stage 3b: Secondary | ICD-10-CM | POA: Diagnosis not present

## 2024-02-04 DIAGNOSIS — E1121 Type 2 diabetes mellitus with diabetic nephropathy: Secondary | ICD-10-CM | POA: Diagnosis not present

## 2024-02-04 DIAGNOSIS — D509 Iron deficiency anemia, unspecified: Secondary | ICD-10-CM | POA: Diagnosis not present

## 2024-02-05 ENCOUNTER — Ambulatory Visit
Admission: RE | Admit: 2024-02-05 | Discharge: 2024-02-05 | Disposition: A | Discharge status: Home / Self Care | Source: Ambulatory Visit | Attending: Family Medicine | Admitting: Family Medicine

## 2024-02-05 DIAGNOSIS — Z1231 Encounter for screening mammogram for malignant neoplasm of breast: Secondary | ICD-10-CM | POA: Diagnosis not present

## 2024-02-07 DIAGNOSIS — E871 Hypo-osmolality and hyponatremia: Secondary | ICD-10-CM | POA: Diagnosis not present

## 2024-02-07 DIAGNOSIS — E785 Hyperlipidemia, unspecified: Secondary | ICD-10-CM | POA: Diagnosis not present

## 2024-02-07 DIAGNOSIS — I129 Hypertensive chronic kidney disease with stage 1 through stage 4 chronic kidney disease, or unspecified chronic kidney disease: Secondary | ICD-10-CM | POA: Diagnosis not present

## 2024-02-07 DIAGNOSIS — N1832 Chronic kidney disease, stage 3b: Secondary | ICD-10-CM | POA: Diagnosis not present

## 2024-02-07 DIAGNOSIS — C189 Malignant neoplasm of colon, unspecified: Secondary | ICD-10-CM | POA: Diagnosis not present

## 2024-02-07 DIAGNOSIS — D649 Anemia, unspecified: Secondary | ICD-10-CM | POA: Diagnosis not present

## 2024-02-07 DIAGNOSIS — E049 Nontoxic goiter, unspecified: Secondary | ICD-10-CM | POA: Diagnosis not present

## 2024-03-05 ENCOUNTER — Telehealth: Payer: Self-pay

## 2024-03-05 NOTE — Telephone Encounter (Signed)
 Patient called stating that she has been having some elevated blood sugar readings while taking Jardiance .   Blood Sugar readings: [phone] Her 7 day avg: 147  Other Symptoms: Weight gain,  She states she has been eating the same.

## 2024-03-05 NOTE — Telephone Encounter (Signed)
 I called and spoke with the patient. I advised her of the information below. She states that she has been drinking 4-5 cups of water a day. I asked her what's the size of the cup and shew states it 16oz but not sure. She is concerned about increasing because her Kidney doc told her not to drink so much because she will be flushing out her electrolytes and she already has low sodium.

## 2024-03-05 NOTE — Telephone Encounter (Signed)
 We could try to have her increase her hydration.  Dehydration can make the sugars appear to be too high.  It would be great if we could continue the Jardiance , since this can help greatly with cardiac and renal outcomes.  If sugars do not improve after increasing hydration, we can decrease the dose of Jardiance  to only half a tablet and see how she does.

## 2024-03-05 NOTE — Telephone Encounter (Signed)
 LMTRC  J.Shonna Deiter,RMA

## 2024-03-06 NOTE — Telephone Encounter (Signed)
 Pt has been notified and voices understanding.

## 2024-03-24 MED ORDER — GLIPIZIDE ER 2.5 MG PO TB24: 2.5000 mg | ORAL_TABLET | Freq: Every day | ORAL | 2 refills | Status: DC

## 2024-03-24 NOTE — Telephone Encounter (Signed)
 Pt has been notified and voices understanding,

## 2024-03-24 NOTE — Addendum Note (Signed)
 Addended by: CLEOTILDE ROLIN RAMAN on: 03/24/2024 05:06 PM   Modules accepted: Orders

## 2024-03-24 NOTE — Telephone Encounter (Signed)
 Patient called back regarding the Hyperglycemia. She originally left a vm stating her BS was high, so I called her back to get more info. She states that she did hold her Jardiance  x 1 week as advised but she started iot back at 10 mg vs 5 mg. Per the message below.   Her readings have been running 120-180 for the last week. Today and Yesterday she has been having readings in the 170s.   She is scheduled to have surgery Mid July.

## 2024-03-27 NOTE — Progress Notes (Signed)
 Sent message, via epic in basket, requesting orders in epic from Careers adviser.

## 2024-04-02 NOTE — Care Plan (Signed)
 Ortho Bundle Case Management Note  Patient Details  Name: Robin [name] MRN: [id number] Date of Birth: February 29, 1941  spoke with patient. will discharge to home with family. no DME needed. OPPT set up with [facility] [address]. discharge instructions mailed and discussed. questions answered.  Patient and MD in agreement with plan. Choice offered                  DME Arranged:    DME Agency:     HH Arranged:    HH Agency:     Additional Comments: Please contact me with any questions of if this plan should need to change.  Charlies Pitch,  RN,BSN,MHA,CCM  [facility]  [phone] 04/02/2024, 4:42 PM

## 2024-04-02 NOTE — Patient Instructions (Addendum)
 SURGICAL WAITING ROOM VISITATION Patients having surgery or a procedure may have no more than 2 support people in the waiting area - these visitors may rotate in the visitor waiting room.   If the patient needs to stay at the hospital during part of their recovery, the visitor guidelines for inpatient rooms apply.  PRE-OP VISITATION  Pre-op nurse will coordinate an appropriate time for 1 support person to accompany the patient in pre-op.  This support person may not rotate.  This visitor will be contacted when the time is appropriate for the visitor to come back in the pre-op area.  Please refer to the [facility] website for the visitor guidelines for Inpatients (after your surgery is over and you are in a regular room).  You are not required to quarantine at this time prior to your surgery. However, you must do this: Hand Hygiene often Do NOT share personal items Notify your provider if you are in close contact with someone who has COVID or you develop fever 100.4 or greater, new onset of sneezing, cough, sore throat, shortness of breath or body aches.  If you test positive for Covid or have been in contact with anyone that has tested positive in the last 10 days please notify you surgeon.    Your procedure is scheduled on:  Wednesday  April 16, 2024  Report to [facility] Main Entrance: [address] entrance where the [employer] is available.   Report to admitting at:  05:15   AM  Call this number if you have any questions or problems the morning of surgery [phone]  DO NOT EAT OR DRINK ANYTHING AFTER MIDNIGHT THE NIGHT PRIOR TO YOUR SURGERY / PROCEDURE.   FOLLOW  ANY ADDITIONAL PRE OP INSTRUCTIONS YOU RECEIVED FROM YOUR SURGEON'S OFFICE!!!   Oral Hygiene is also important to reduce your risk of infection.        Remember - BRUSH YOUR TEETH THE MORNING OF SURGERY WITH YOUR REGULAR TOOTHPASTE  Do NOT smoke after Midnight the night before surgery.  STOP TAKING all  Vitamins, Herbs and supplements 1 week before your surgery.   GLIPIZIDE - Day BEFORE surgery, take as usual. DAY OF SURGERY: DO NOT TAKE GLIPIZIDE   Empagliflozin  (Jardiance ) - stop 72 hrs before surgery. Last dose will be taken on Saturday 04-12-24   Take ONLY these medicines the morning of surgery with A SIP OF WATER: levothyroxine , amlodipine , Fluoxetine  and Omeprazole  if needed. You may use your Eye drops and Flonase  nasal spray if needed.   DO NOT TAKE Losartan  or Furosemide the Morning of your surgery.    If You have been diagnosed with Sleep Apnea - Bring CPAP mask and tubing day of surgery. We will provide you with a CPAP machine on the day of your surgery.                   You may not have any metal on your body including hair pins, jewelry, and body piercing  Do not wear make-up, lotions, powders, perfumes / cologne, or deodorant  Do not wear nail polish including gel and S&S, artificial / acrylic nails, or any other type of covering on natural nails including finger and toenails. If you have artificial nails, gel coating, etc., that needs to be removed by a nail salon, Please have this removed prior to surgery. Not doing so may mean that your surgery could be cancelled or delayed if the Surgeon or anesthesia staff feels like they are unable to  monitor you safely.   Do not shave 48 hours prior to surgery to avoid nicks in your skin which may contribute to postoperative infections.   Men may shave face and neck.  Contacts, Hearing Aids, dentures or bridgework may not be worn into surgery. DENTURES WILL BE REMOVED PRIOR TO SURGERY PLEASE DO NOT APPLY Poly grip OR ADHESIVES!!!  You may bring a small overnight bag with you on the day of surgery, only pack items that are not valuable. [facility] IS NOT RESPONSIBLE   FOR VALUABLES THAT ARE LOST OR STOLEN.   Patients discharged on the day of surgery will not be allowed to drive home.  Someone NEEDS to stay with you for the first 24  hours after anesthesia.  Do not bring your home medications to the hospital. The Pharmacy will dispense medications listed on your medication list to you during your admission in the Hospital.  Special Instructions: Bring a copy of your healthcare power of attorney and living will documents the day of surgery, if you wish to have them scanned into your [facility] Records- EPIC  Please read over the following fact sheets you were given: IF YOU HAVE QUESTIONS ABOUT YOUR PRE-OP INSTRUCTIONS, PLEASE CALL [phone].        Pre-operative 5 CHG Bath Instructions   You can play a key role in reducing the risk of infection after surgery. Your skin needs to be as free of germs as possible. You can reduce the number of germs on your skin by washing with CHG (chlorhexidine gluconate) soap before surgery. CHG is an antiseptic soap that kills germs and continues to kill germs even after washing.   DO NOT use if you have an allergy to chlorhexidine/CHG or antibacterial soaps. If your skin becomes reddened or irritated, stop using the CHG and notify one of our RNs at [phone]  Please shower with the CHG soap starting 4 days before surgery using the following schedule: START SHOWERS ON   SATURDAY  April 12, 2024                                                                                                                                                                           Please keep in mind the following:  DO NOT shave, including legs and underarms, starting the day of your first shower.   You may shave your face at any point before/day of surgery.   Place clean sheets on your bed the day you start using CHG soap. Use a clean washcloth (not used since being washed) for each shower. DO NOT sleep with pets once you start using the CHG.   CHG Shower Instructions:  If you choose to wash your hair  and private area, wash first with your normal shampoo/soap.  After you use  shampoo/soap, rinse your hair and body thoroughly to remove shampoo/soap residue.  Turn the water OFF and apply about 3 tablespoons (45 ml) of CHG soap to a CLEAN washcloth.  Apply CHG soap ONLY FROM YOUR NECK DOWN TO YOUR TOES (washing for 3-5 minutes)  DO NOT use CHG soap on face, private areas, open wounds, or sores.  Pay special attention to the area where your surgery is being performed.  If you are having back surgery, having someone wash your back for you may be helpful.  Wait 2 minutes after CHG soap is applied, then you may rinse off the CHG soap.  Pat dry with a clean towel  Put on clean clothes/pajamas   If you choose to wear lotion, please use ONLY the CHG-compatible lotions on the back of this paper.     Additional instructions for the day of surgery: DO NOT APPLY any lotions, deodorants, cologne, or perfumes.   Put on clean/comfortable clothes.  Brush your teeth.  Ask your nurse before applying any prescription medications to the skin.      CHG Compatible Lotions   Aveeno Moisturizing lotion  Cetaphil Moisturizing Cream  Cetaphil Moisturizing Lotion  Clairol Herbal Essence Moisturizing Lotion, Dry Skin  Clairol Herbal Essence Moisturizing Lotion, Extra Dry Skin  Clairol Herbal Essence Moisturizing Lotion, Normal Skin  Curel Age Defying Therapeutic Moisturizing Lotion with Alpha Hydroxy  Curel Extreme Care Body Lotion  Curel Soothing Hands Moisturizing Hand Lotion  Curel Therapeutic Moisturizing Cream, Fragrance-Free  Curel Therapeutic Moisturizing Lotion, Fragrance-Free  Curel Therapeutic Moisturizing Lotion, Original Formula  Eucerin Daily Replenishing Lotion  Eucerin Dry Skin Therapy Plus Alpha Hydroxy Crme  Eucerin Dry Skin Therapy Plus Alpha Hydroxy Lotion  Eucerin Original Crme  Eucerin Original Lotion  Eucerin Plus Crme Eucerin Plus Lotion  Eucerin TriLipid Replenishing Lotion  Keri Anti-Bacterial Hand Lotion  Keri Deep Conditioning Original Lotion  Dry Skin Formula Softly Scented  Keri Deep Conditioning Original Lotion, Fragrance Free Sensitive Skin Formula  Keri Lotion Fast Absorbing Fragrance Free Sensitive Skin Formula  Keri Lotion Fast Absorbing Softly Scented Dry Skin Formula  Keri Original Lotion  Keri Skin Renewal Lotion Keri Silky Smooth Lotion  Keri Silky Smooth Sensitive Skin Lotion  Nivea Body Creamy Conditioning Oil  Nivea Body Extra Enriched Lotion  Nivea Body Original Lotion  Nivea Body Sheer Moisturizing Lotion Nivea Crme  Nivea Skin Firming Lotion  NutraDerm 30 Skin Lotion  NutraDerm Skin Lotion  NutraDerm Therapeutic Skin Cream  NutraDerm Therapeutic Skin Lotion  ProShield Protective Hand Cream  Provon moisturizing lotion     Preparing for Total Shoulder Arthroplasty ================================================================= Please follow these instructions carefully, in addition to any other special Bathing information that was explained to you at the Presurgical Appointment:  BENZOYL PEROXIDE 5% GEL: Used to kill bacteria on the skin which could cause an infection at the surgery site.   Please do not use if you have an allergy to benzoyl peroxide. If your skin becomes reddened/irritated stop using the benzoyl peroxide and inform your Doctor.   Starting two days before surgery, apply as follows:  1. Apply benzoyl peroxide gel in the morning and at night. Apply after taking a shower. If you are not taking a shower, clean entire shoulder front, back, and side, along with the armpit with a clean wet washcloth.  2. Place a quarter-sized dollop of the gel on your SHOULDER and rub in thoroughly, making  sure to cover the front, back, and side of your shoulder, along with the armpit.   2 Days prior to Surgery  [facility]  04-14-2024 First Application _______ Morning Second Application _______ Night  Day Before Surgery    TUESDAY  04-15-2024 First Application______ Morning  On the night before surgery, wash  your entire body (except hair, face and private areas) with CHG Soap. THEN, rub in the LAST application of the Benzoyl Peroxide Gel on your shoulder.   3. On the Morning of Surgery wash your BODY AGAIN with CHG Soap (except hair, face and private areas)  4. DO NOT USE THE BENZOYL PEROXIDE GEL ON THE DAY OF YOUR SURGERY      FAILURE TO FOLLOW THESE INSTRUCTIONS MAY RESULT IN THE CANCELLATION OF YOUR SURGERY  PATIENT SIGNATURE_________________________________  NURSE SIGNATURE__________________________________  ________________________________________________________________________         Nasario Exon    An incentive spirometer is a tool that can help keep your lungs clear and active. This tool measures how well you are filling your lungs with each breath. Taking long deep breaths may help reverse or decrease the chance of developing breathing (pulmonary) problems (especially infection) following: A long period of time when you are unable to move or be active. BEFORE THE PROCEDURE  If the spirometer includes an indicator to show your best effort, your nurse or respiratory therapist will set it to a desired goal. If possible, sit up straight or lean slightly forward. Try not to slouch. Hold the incentive spirometer in an upright position. INSTRUCTIONS FOR USE  Sit on the edge of your bed if possible, or sit up as far as you can in bed or on a chair. Hold the incentive spirometer in an upright position. Breathe out normally. Place the mouthpiece in your mouth and seal your lips tightly around it. Breathe in slowly and as deeply as possible, raising the piston or the ball toward the top of the column. Hold your breath for 3-5 seconds or for as long as possible. Allow the piston or ball to fall to the bottom of the column. Remove the mouthpiece from your mouth and breathe out normally. Rest for a few seconds and repeat Steps 1 through 7 at least 10 times every 1-2 hours  when you are awake. Take your time and take a few normal breaths between deep breaths. The spirometer may include an indicator to show your best effort. Use the indicator as a goal to work toward during each repetition. After each set of 10 deep breaths, practice coughing to be sure your lungs are clear. If you have an incision (the cut made at the time of surgery), support your incision when coughing by placing a pillow or rolled up towels firmly against it. Once you are able to get out of bed, walk around indoors and cough well. You may stop using the incentive spirometer when instructed by your caregiver.  RISKS AND COMPLICATIONS Take your time so you do not get dizzy or light-headed. If you are in pain, you may need to take or ask for pain medication before doing incentive spirometry. It is harder to take a deep breath if you are having pain. AFTER USE Rest and breathe slowly and easily. It can be helpful to keep track of a log of your progress. Your caregiver can provide you with a simple table to help with this. If you are using the spirometer at home, follow these instructions: SEEK MEDICAL CARE IF:  You are  having difficultly using the spirometer. You have trouble using the spirometer as often as instructed. Your pain medication is not giving enough relief while using the spirometer. You develop fever of 100.5 F (38.1 C) or higher.                                                                                                    SEEK IMMEDIATE MEDICAL CARE IF:  You cough up bloody sputum that had not been present before. You develop fever of 102 F (38.9 C) or greater. You develop worsening pain at or near the incision site. MAKE SURE YOU:  Understand these instructions. Will watch your condition. Will get help right away if you are not doing well or get worse. Document Released: 01/22/2007 Document Revised: 12/04/2011 Document Reviewed: 03/25/2007 [facility] Patient Information  2014 [place], [place].         If you would like to see a video about joint replacement:   [email or web address]

## 2024-04-02 NOTE — Progress Notes (Signed)
 COVID Vaccine received:  []  No [x]  Yes Date of any COVID positive Test in last 90 days:  PCP - Olam Pinal, MD at [facility] [phone]  Cardiologist - Peter Swaziland, MD, Lonell Bring, PA-C cardiac clearance in 01-23-24 phone note Oncology- Lauraine Dais, PA-C   Chest x-ray - 10-29-2023  2v  Epic EKG -  11-02-2023  Epic Stress Test -  ECHO -  Cardiac Cath -  CT Coronary Calcium score: 342 on 09-12-2021  Pacemaker / ICD device [x]  No []  Yes   Spinal Cord Stimulator:[x]  No []  Yes       History of Sleep Apnea? []  No [x]  Yes   CPAP used?- []  No []  Yes    Does the patient monitor blood sugar?   []  N/A   []  No []  Yes  Patient has: []  NO Hx DM   []  Pre-DM   []  DM1  [x]   DM2 Last A1c was:  7.1  on  03-25-2024     Does patient have a [employer] or Dexcom? []  No []  Yes   Fasting Blood Sugar Ranges-  Checks Blood Sugar _____ times a day GLIPIZIDE - 2.5 mg q am    Hold DOS Empagliflozin  (Jardiance ) - Hold x 72 hrs  Last dose on Saturday 04-12-24  Blood Thinner / Instructions: Aspirin Instructions:  ASA 81 mg   Hold x 7 days  last dose: 04-09-2024  ERAS Protocol Ordered: [x]  No  []  Yes Patient is to be NPO after: [place] Prior  Dental hx: []  Dentures:  []  N/A      []  Bridge or Partial:                   []  Loose or Damaged teeth:   Comments: Patient was given the 5 CHG shower / bath instructions for Reverse Shoulder arthroplasty surgery along with 2 bottles of the CHG soap. Patient will start this on:   04-12-24  The patient was given Benzoyl peroxide Gel as ordered. Instruction regarding application starting 2 days prior to surgery was given and patient voiced understanding.            Activity level: Able to walk up 2 flights of stairs without becoming significantly short of breath or having chest pain?  []  No   []    Yes   Anesthesia review: DM2, HTN, anemia, CKD3, s/p colectomy for cancer (2000), problems waking up from anesthesia  OSA-CPAP  Patient denies any S&S of  respiratory illness or Covid - no shortness of breath, fever, cough or chest pain at PAT appointment.  Patient verbalized understanding and agreement to the Pre-Surgical Instructions that were given to them at this PAT appointment. Patient was also educated of the need to review these PAT instructions again prior to her surgery.I reviewed the appropriate phone numbers to call if they have any and questions or concerns.

## 2024-04-03 ENCOUNTER — Other Ambulatory Visit: Payer: Self-pay

## 2024-04-03 ENCOUNTER — Encounter (HOSPITAL_COMMUNITY): Payer: Self-pay

## 2024-04-03 ENCOUNTER — Encounter (HOSPITAL_COMMUNITY)
Admission: RE | Admit: 2024-04-03 | Discharge: 2024-04-03 | Disposition: A | Discharge status: Home / Self Care | Source: Ambulatory Visit | Attending: Orthopaedic Surgery | Admitting: Orthopaedic Surgery

## 2024-04-03 VITALS — BP 148/58 | HR 58 | Temp 98.6°F | Resp 16 | Ht 62.0 in | Wt 146.0 lb

## 2024-04-03 DIAGNOSIS — Z01818 Encounter for other preprocedural examination: Secondary | ICD-10-CM

## 2024-04-03 DIAGNOSIS — E1121 Type 2 diabetes mellitus with diabetic nephropathy: Secondary | ICD-10-CM | POA: Diagnosis not present

## 2024-04-03 DIAGNOSIS — Z01812 Encounter for preprocedural laboratory examination: Secondary | ICD-10-CM | POA: Insufficient documentation

## 2024-04-03 DIAGNOSIS — D509 Iron deficiency anemia, unspecified: Secondary | ICD-10-CM | POA: Insufficient documentation

## 2024-04-03 HISTORY — DX: Inflammatory liver disease, unspecified: K75.9

## 2024-04-03 HISTORY — DX: Hypothyroidism, unspecified: E03.9

## 2024-04-03 HISTORY — DX: Chronic kidney disease, unspecified: N18.9

## 2024-04-03 HISTORY — DX: Gastro-esophageal reflux disease without esophagitis: K21.9

## 2024-04-03 HISTORY — DX: Anxiety disorder, unspecified: F41.9

## 2024-04-03 LAB — BASIC METABOLIC PANEL WITH GFR
Anion gap: 9 (ref 5–15)
BUN: 24 mg/dL — ABNORMAL HIGH (ref 8–23)
CO2: 25 mmol/L (ref 22–32)
Calcium: 9.4 mg/dL (ref 8.9–10.3)
Chloride: 101 mmol/L (ref 98–111)
Creatinine, Ser: 1.51 mg/dL — ABNORMAL HIGH (ref 0.44–1.00)
GFR, Estimated: 34 mL/min — ABNORMAL LOW (ref >60)
Glucose, Bld: 174 mg/dL — ABNORMAL HIGH (ref 70–99)
Potassium: 4.1 mmol/L (ref 3.5–5.1)
Sodium: 135 mmol/L (ref 135–145)

## 2024-04-03 LAB — SURGICAL PCR SCREEN
MRSA, PCR: NEGATIVE
Staphylococcus aureus: POSITIVE — AB

## 2024-04-03 LAB — CBC
HCT: 37.8 % (ref 36.0–46.0)
Hemoglobin: 12.5 g/dL (ref 12.0–15.0)
MCH: 31 pg (ref 26.0–34.0)
MCHC: 33.1 g/dL (ref 30.0–36.0)
MCV: 93.8 fL (ref 80.0–100.0)
Platelets: 219 K/uL (ref 150–400)
RBC: 4.03 MIL/uL (ref 3.87–5.11)
RDW: 12.2 % (ref 11.5–15.5)
WBC: 7.5 K/uL (ref 4.0–10.5)
nRBC: 0 % (ref 0.0–0.2)

## 2024-04-03 LAB — GLUCOSE, CAPILLARY: Glucose-Capillary: 178 mg/dL — ABNORMAL HIGH (ref 70–99)

## 2024-04-03 NOTE — Progress Notes (Signed)
 Patient's PCR screen is positive for STAPH. Appropriate notes have been placed on the patient's chart. This note has been routed to Dr. Cristy and Aleck Stalling, PAr review. The Patient's surgery is currently scheduled for: 04-16-24 at [facility].  Shawnee Aloe, BSN, CVRN-BC   Pre-Surgical Testing Nurse [facility]  [phone]

## 2024-04-04 ENCOUNTER — Other Ambulatory Visit: Payer: Self-pay

## 2024-04-04 ENCOUNTER — Emergency Department (HOSPITAL_BASED_OUTPATIENT_CLINIC_OR_DEPARTMENT_OTHER)

## 2024-04-04 ENCOUNTER — Encounter (HOSPITAL_BASED_OUTPATIENT_CLINIC_OR_DEPARTMENT_OTHER): Payer: Self-pay

## 2024-04-04 ENCOUNTER — Encounter (HOSPITAL_COMMUNITY)
Admission: RE | Admit: 2024-04-04 | Discharge: 2024-04-04 | Disposition: A | Discharge status: Home / Self Care | Source: Ambulatory Visit | Attending: Orthopaedic Surgery | Admitting: Orthopaedic Surgery

## 2024-04-04 ENCOUNTER — Emergency Department (HOSPITAL_BASED_OUTPATIENT_CLINIC_OR_DEPARTMENT_OTHER)
Admission: EM | Admit: 2024-04-04 | Discharge: 2024-04-04 | Disposition: A | Discharge status: Home / Self Care | Attending: Emergency Medicine | Admitting: Emergency Medicine

## 2024-04-04 DIAGNOSIS — S32810A Multiple fractures of pelvis with stable disruption of pelvic ring, initial encounter for closed fracture: Secondary | ICD-10-CM | POA: Insufficient documentation

## 2024-04-04 DIAGNOSIS — Y92009 Unspecified place in unspecified non-institutional (private) residence as the place of occurrence of the external cause: Secondary | ICD-10-CM | POA: Diagnosis not present

## 2024-04-04 DIAGNOSIS — S79911A Unspecified injury of right hip, initial encounter: Secondary | ICD-10-CM | POA: Diagnosis present

## 2024-04-04 DIAGNOSIS — Z7982 Long term (current) use of aspirin: Secondary | ICD-10-CM | POA: Insufficient documentation

## 2024-04-04 DIAGNOSIS — Z7984 Long term (current) use of oral hypoglycemic drugs: Secondary | ICD-10-CM | POA: Insufficient documentation

## 2024-04-04 DIAGNOSIS — M1611 Unilateral primary osteoarthritis, right hip: Secondary | ICD-10-CM | POA: Diagnosis not present

## 2024-04-04 DIAGNOSIS — Z85038 Personal history of other malignant neoplasm of large intestine: Secondary | ICD-10-CM | POA: Diagnosis not present

## 2024-04-04 DIAGNOSIS — W182XXA Fall in (into) shower or empty bathtub, initial encounter: Secondary | ICD-10-CM | POA: Insufficient documentation

## 2024-04-04 DIAGNOSIS — S72001A Fracture of unspecified part of neck of right femur, initial encounter for closed fracture: Secondary | ICD-10-CM | POA: Diagnosis not present

## 2024-04-04 DIAGNOSIS — E119 Type 2 diabetes mellitus without complications: Secondary | ICD-10-CM | POA: Insufficient documentation

## 2024-04-04 DIAGNOSIS — S32591A Other specified fracture of right pubis, initial encounter for closed fracture: Secondary | ICD-10-CM | POA: Diagnosis not present

## 2024-04-04 DIAGNOSIS — S32511A Fracture of superior rim of right pubis, initial encounter for closed fracture: Secondary | ICD-10-CM | POA: Diagnosis not present

## 2024-04-04 DIAGNOSIS — M76891 Other specified enthesopathies of right lower limb, excluding foot: Secondary | ICD-10-CM | POA: Diagnosis not present

## 2024-04-04 MED ORDER — OXYCODONE HCL 5 MG PO TABS
5.0000 mg | ORAL_TABLET | Freq: Once | ORAL | Status: AC
  Administered 2024-04-04: 5 mg via ORAL
  Filled 2024-04-04: qty 1

## 2024-04-04 MED ORDER — OXYCODONE HCL 5 MG PO TABS: 5.0000 mg | ORAL_TABLET | Freq: Four times a day (QID) | ORAL | 0 refills | Status: DC | PRN

## 2024-04-04 MED ORDER — METHOCARBAMOL 500 MG PO TABS
500.0000 mg | ORAL_TABLET | Freq: Three times a day (TID) | ORAL | 0 refills | Status: DC
Start: 2024-04-04 — End: 2024-05-30

## 2024-04-04 NOTE — ED Provider Notes (Signed)
 [place] EMERGENCY DEPARTMENT AT [facility] Provider Note   CSN: [id number] Arrival date & time: 04/04/24  9072     Patient presents with: Fall and Hip Pain (Right/)   Robin [name] is a 83 y.o. female.   Patient with history of diabetes, high cholesterol, history of colon cancer, scheduled for shoulder arthroplasty on July 23 with Dr. Cristy --presents to the emergency department today for evaluation of right hip pain.  Patient had a mechanical fall while in her shower this morning, slipping on the floor and falling onto her right side.  Patient states that she was able to get up slide to another room.  She was not able to bear weight on her right leg.  Family assisted her and had to carry her into the car.  No treatments prior to arrival.  Patient did not hit her head or hurt her neck.  She is not on anticoagulation.  She denies other injuries.  Pain is in the right groin area, worse with movement.       Prior to Admission medications   Medication Sig Start Date End Date Taking? Authorizing Provider  amLODipine  (NORVASC ) 10 MG tablet Take 10 mg by mouth in the morning. 07/28/11   [provider]  aspirin 81 MG tablet Take 81 mg by mouth at bedtime.    [provider]  beta carotene w/minerals (OCUVITE) tablet Take 1 tablet by mouth daily in the afternoon.    [provider]  budesonide  (ENTOCORT EC ) 3 MG 24 hr capsule Take 3 mg by mouth daily as needed (stomach issues.). 05/25/20   [provider]  Cholecalciferol (VITAMIN D -3 PO) Take 1 tablet by mouth at bedtime.    [provider]  Cyanocobalamin (VITAMIN B-12 PO) Take 1 tablet by mouth at bedtime.    [provider]  empagliflozin  (JARDIANCE ) 10 MG TABS tablet Take 1 tablet (10 mg total) by mouth daily before breakfast. 12/17/23   Trixie File, MD  EPINEPHrine 0.3 mg/0.3 mL IJ SOAJ injection See admin instructions. 05/22/18   [provider]  ezetimibe  (ZETIA) 10 MG tablet Take 10 mg by mouth daily after breakfast. 07/28/20   [provider]  fexofenadine  (ALLEGRA ) 180 MG tablet Take 180 mg by mouth daily as needed for allergies.    [provider]  FLUoxetine  (PROZAC ) 20 MG capsule Take 20 mg by mouth daily after breakfast. 07/27/11   [provider]  fluticasone  (FLONASE ) 50 MCG/ACT nasal spray Place 1 spray into both nostrils daily as needed for allergies. 10/28/18   [provider]  furosemide (LASIX) 20 MG tablet Take 20 mg by mouth in the morning. 01/14/24   [provider]  glipiZIDE  (GLUCOTROL  XL) 2.5 MG 24 hr tablet Take 1 tablet (2.5 mg total) by mouth daily with breakfast. 03/24/24   Trixie File, MD  Glucosamine HCl 1500 MG TABS Take 1,500 mg by mouth daily in the afternoon.    [provider]  glucose blood (ONETOUCH VERIO) test strip USE TO CHECK YOU BLOOD SUGAR ONCE OR TWICE DAILY 11/08/23   Trixie File, MD  L-LYSINE PO Take 1 capsule by mouth daily as needed (fever blister).    [provider]  levothyroxine  (SYNTHROID , LEVOTHROID) 112 MCG tablet Take 112 mcg by mouth daily before breakfast.  06/24/15   [provider]  losartan  (COZAAR ) 100 MG tablet Take 100 mg by mouth daily after breakfast. 11/06/23   [place], Peter M, MD  MAGNESIUM PO  Take 1 capsule by mouth at bedtime.    [provider]  methocarbamol  (ROBAXIN ) 500 MG tablet Take 500 mg by mouth every 8 (eight) hours as needed for muscle spasms. 03/22/24   [provider]  Multiple Vitamin (MULTIVITAMIN) capsule Take 1 capsule by mouth daily in the afternoon. Complete Multivitamin for Women    [provider]  Omega-3 Fatty Acids (FISH OIL PO) Take 1 capsule by mouth daily in the afternoon.    [provider]  omeprazole  (PRILOSEC) 40 MG capsule Take 40 mg by mouth daily as needed (acid reflux/indigestion.).    [provider]  polyvinyl alcohol (LIQUIFILM  TEARS) 1.4 % ophthalmic solution Place 1 drop into both eyes as needed for dry eyes.    [provider]  Probiotic Product (PROBIOTIC DAILY) CAPS Take 1 capsule by mouth at bedtime.    [provider]    Allergies: Atorvastatin, Celecoxib, Rosuvastatin, Ace inhibitors, and Statins    Review of Systems  Updated Vital Signs BP (!) 180/61 (BP Location: Right Arm)   Pulse 65   Temp 99.3 F (37.4 C) (Oral)   Resp 20   Ht 5' 2 (1.575 m)   Wt 66.2 kg   SpO2 99%   BMI 26.69 kg/m   Physical Exam Vitals and nursing note reviewed.  Constitutional:      Appearance: She is well-developed.  HENT:     Head: Normocephalic and atraumatic.  Eyes:     Pupils: Pupils are equal, round, and reactive to light.  Cardiovascular:     Pulses: Normal pulses. No decreased pulses.  Musculoskeletal:        General: Tenderness present.     Cervical back: Normal range of motion and neck supple.     Right hip: Tenderness present. Decreased range of motion.     Right knee: Normal range of motion. No tenderness.     Left knee: Normal range of motion. No tenderness.     Right ankle: No tenderness. Normal range of motion.     Left ankle: No tenderness. Normal range of motion.     Comments: Patient is able to flex her right knee and flex the right hip a fair amount but with discomfort.  Skin:    General: Skin is warm and dry.  Neurological:     Mental Status: She is alert.     Sensory: No sensory deficit.     Comments: Motor, sensation, and vascular distal to the injury is fully intact.   Psychiatric:        Mood and Affect: Mood normal.     (all labs ordered are listed, but only abnormal results are displayed) Labs Reviewed - No data to display  EKG: None  Radiology: No results found.   Procedures   Medications Ordered in the ED  oxyCODONE  (Oxy IR/ROXICODONE ) immediate release tablet 5 mg (5 mg Oral Given 04/04/24 9047)   ED Course  Patient seen and examined. History  obtained directly from patient and family member at bedside.   Labs/EKG: None ordered.   Imaging: Ordered x-ray right hip.  Medications/Fluids: Ordered: PO oxycodone .  Most recent vital signs reviewed and are as follows: BP (!) 180/61 (BP Location: Right Arm)   Pulse 65   Temp 99.3 F (37.4 C) (Oral)   Resp 20   Ht 5' 2 (1.575 m)   Wt 66.2 kg   SpO2 99%   BMI 26.69 kg/m   Initial impression: Mechanical fall, right hip pain,  evaluate for contusion versus fracture.  10:22 AM Reassessment performed. Patient appears stable, comfortable.  Imaging personally visualized and interpreted including: X-ray right hip, suggests right inferior pubic rami fracture.  Reviewed pertinent lab work and imaging with patient at bedside. Questions answered.   Most current vital signs reviewed and are as follows: BP (!) 180/61 (BP Location: Right Arm)   Pulse 65   Temp 99.3 F (37.4 C) (Oral)   Resp 20   Ht 5' 2 (1.575 m)   Wt 66.2 kg   SpO2 99%   BMI 26.69 kg/m   Plan: CT imaging to further evaluate injury, rule out occult femur fracture.  12:48 PM Reassessment performed. Patient appears stable.  She was able to transfer to wheelchair with assistance.  Patient's family member has a wheelchair that she can use at home.  Imaging personally visualized and interpreted including: CT demonstrates pelvis fracture and buckle fracture.  Reviewed pertinent lab work and imaging with patient at bedside. Questions answered.   Most current vital signs reviewed and are as follows: BP (!) 180/61 (BP Location: Right Arm)   Pulse 65   Temp 99.3 F (37.4 C) (Oral)   Resp 20   Ht 5' 2 (1.575 m)   Wt 66.2 kg   SpO2 99%   BMI 26.69 kg/m   Plan: Discharge to home.   Prescriptions written for: Oxycodone  # 10 tablets, Robaxin   Other home care instructions discussed: RICE protocol, Tylenol  ED return instructions discussed: New or worsening symptoms  Follow-up instructions discussed: Patient  encouraged to follow-up with their orthopedist                                    Medical Decision Making Amount and/or Complexity of Data Reviewed Radiology: ordered.  Risk Prescription drug management.   Patient presents with right groin pain after mechanical fall at home.  She sustained pelvis fractures.  No femur fracture on CT.  No concern for head or neck injury.  No infectious symptoms.  She has been given pain control, appears comfortable.  She has good family support.  She is already established with orthopedics.  Plan for treatment at home, activity as tolerated, pain control, orthopedic follow-up.  The patient's vital signs, pertinent lab work and imaging were reviewed and interpreted as discussed in the ED course. Hospitalization was considered for further testing, treatments, or serial exams/observation. However as patient is well-appearing, has a stable exam, and reassuring studies today, I do not feel that they warrant admission at this time. This plan was discussed with the patient who verbalizes agreement and comfort with this plan and seems reliable and able to return to the Emergency Department with worsening or changing symptoms.       Final diagnoses:  Multiple closed fractures of pelvis with stable disruption of pelvic ring, initial encounter [facility])    ED Discharge Orders          Ordered    oxyCODONE  (OXY IR/ROXICODONE ) 5 MG immediate release tablet  Every 6 hours PRN        04/04/24 1241    methocarbamol  (ROBAXIN ) 500 MG tablet  3 times daily        04/04/24 1241               Desiderio Chew, PA-C 04/04/24 1250    Dean Clarity, MD 04/07/24 [phone]

## 2024-04-04 NOTE — ED Notes (Signed)
 Pt assisted to standing position to attempt ambulation. Pt was anxious to bear weight on the right leg. Pt held onto RN and shuffled her left foot, but was encouraged to see if she could take a step to the wheelchair which was beside the bed. Pt hesitated but was able to bear weight for a brief moment, stating it causes pain, before being assisted to a seated position in the wheelchair. PA made aware

## 2024-04-04 NOTE — ED Triage Notes (Signed)
 Patient arrives POV with complaints of right hip pain related to a fall this morning. Patient reports that she slipped in the shower, landing on her right side. Now having right hip pain that is radiating to her groin. Rates pain an 8/10. No LOC or head injuries.

## 2024-04-04 NOTE — Discharge Instructions (Addendum)
 Please read and follow all provided instructions.  Your diagnoses today include:  1. Multiple closed fractures of pelvis with stable disruption of pelvic ring, initial encounter (HCC)     Tests performed today include: X-ray of the hip and pelvis showed fracture of the pelvis bone CT of the hip confirmed no hip fracture Vital signs. See below for your results today.   Medications prescribed:  Oxycodone  - narcotic pain medication  DO NOT drive or perform any activities that require you to be awake and alert because this medicine can make you drowsy.   Use pain medication only under direct supervision at the lowest possible dose needed to control your pain.   Robaxin  (methocarbamol ) - muscle relaxer medication  DO NOT drive or perform any activities that require you to be awake and alert because this medicine can make you drowsy.   Take any prescribed medications only as directed.  Home care instructions:  Follow any educational materials contained in this packet.  BE VERY CAREFUL not to take multiple medicines containing Tylenol (also called acetaminophen). Doing so can lead to an overdose which can damage your liver and cause liver failure and possibly death.   Follow-up instructions: Please call your orthopedist for follow-up appointment.   Return instructions:  Please return to the Emergency Department if you experience worsening symptoms.  Please return if you have any other emergent concerns.  Additional Information:  Your vital signs today were: BP (!) 180/61 (BP Location: Right Arm)   Pulse 65   Temp 99.3 F (37.4 C) (Oral)   Resp 20   Ht 5' 2 (1.575 m)   Wt 66.2 kg   SpO2 99%   BMI 26.69 kg/m  If your blood pressure (BP) was elevated above 135/85 this visit, please have this repeated by your doctor within one month. --------------

## 2024-04-08 DIAGNOSIS — M542 Cervicalgia: Secondary | ICD-10-CM | POA: Diagnosis not present

## 2024-04-16 ENCOUNTER — Encounter (HOSPITAL_COMMUNITY): Admission: RE | Payer: Self-pay | Source: Ambulatory Visit

## 2024-04-16 ENCOUNTER — Ambulatory Visit (HOSPITAL_COMMUNITY): Admission: RE | Admit: 2024-04-16 | Source: Ambulatory Visit | Admitting: Orthopaedic Surgery

## 2024-04-16 SURGERY — ARTHROPLASTY, SHOULDER, TOTAL, REVERSE
Anesthesia: General | Site: Shoulder | Laterality: Left

## 2024-04-29 DIAGNOSIS — M25551 Pain in right hip: Secondary | ICD-10-CM | POA: Diagnosis not present

## 2024-05-01 DIAGNOSIS — S329XXD Fracture of unspecified parts of lumbosacral spine and pelvis, subsequent encounter for fracture with routine healing: Secondary | ICD-10-CM | POA: Diagnosis not present

## 2024-05-01 DIAGNOSIS — I129 Hypertensive chronic kidney disease with stage 1 through stage 4 chronic kidney disease, or unspecified chronic kidney disease: Secondary | ICD-10-CM | POA: Diagnosis not present

## 2024-05-01 DIAGNOSIS — Z9181 History of falling: Secondary | ICD-10-CM | POA: Diagnosis not present

## 2024-05-01 DIAGNOSIS — N1832 Chronic kidney disease, stage 3b: Secondary | ICD-10-CM | POA: Diagnosis not present

## 2024-05-05 DIAGNOSIS — N1832 Chronic kidney disease, stage 3b: Secondary | ICD-10-CM | POA: Diagnosis not present

## 2024-05-07 ENCOUNTER — Ambulatory Visit: Payer: Medicare PPO | Admitting: Internal Medicine

## 2024-05-07 ENCOUNTER — Encounter: Payer: Self-pay | Admitting: Internal Medicine

## 2024-05-07 VITALS — BP 138/80 | HR 58 | Ht 62.0 in | Wt 146.4 lb

## 2024-05-07 DIAGNOSIS — Z7984 Long term (current) use of oral hypoglycemic drugs: Secondary | ICD-10-CM | POA: Diagnosis not present

## 2024-05-07 DIAGNOSIS — E785 Hyperlipidemia, unspecified: Secondary | ICD-10-CM | POA: Diagnosis not present

## 2024-05-07 DIAGNOSIS — E063 Autoimmune thyroiditis: Secondary | ICD-10-CM

## 2024-05-07 DIAGNOSIS — E1121 Type 2 diabetes mellitus with diabetic nephropathy: Secondary | ICD-10-CM

## 2024-05-07 DIAGNOSIS — E1169 Type 2 diabetes mellitus with other specified complication: Secondary | ICD-10-CM | POA: Diagnosis not present

## 2024-05-07 LAB — POCT GLYCOSYLATED HEMOGLOBIN (HGB A1C): Hemoglobin A1C: 7 % — AB (ref 4.0–5.6)

## 2024-05-07 NOTE — Patient Instructions (Addendum)
 Please continue: - Glipizide  ER 2.5 mg before b'fast - Jardiance  10 mg before b'fast  Check sugars 1x a day, rotating check times and write them down.  Continue Levothyroxine  112 mcg daily.  Take the thyroid  hormone every day, with water, at least 30 minutes before breakfast, separated by at least 4 hours from: - acid reflux medications - calcium - iron  - multivitamins  Please return in 3-4 months with your sugar log.

## 2024-05-07 NOTE — Progress Notes (Signed)
 Patient ID: Robin [name], female   DOB: 1941/06/15, 83 y.o.   MRN: [id number]  HPI: Robin [name] is a 83 y.o.-year-old female, returning for f/u for DM2, initially GDM, then DM2 dx in 05/2015, non-insulin -dependent, controlled, with complications (mild CKD, diabetic retinopathy). Last visit 6 months ago. She is here with her daughter.  Interim history: No increased urination, blurry vision, nausea.  Since last visit, she had a fall in the shower and had fractures at the R superior and inferior pubic rami. She walks with a walker.  She has a little pain, but not impairing her capacity to walk.  Reviewed HbA1c levels: Lab Results  Component Value Date   HGBA1C 7.1 (A) 01/24/2024   HGBA1C 6.6 (A) 11/08/2023   HGBA1C 6.4 (A) 05/08/2023  07/20/2020: HbA1c 6.6%  Pt is on a regimen of: - Metformin  ER 1000 mg with dinner -she had diarrhea with higher doses >> 500 mg twice a day >> 1000 mg in am and 500 mg with dinner >> off 2/2 CKD - Glipizide  ER 2.5 mg before b'fast- added 03/2024 - Jardiance  10 mg before breakfast-added 10/2023 She did not start Januvia  - $.  Pt checks her sugars 1-2x a day: - am:  153 >> n/c >> 139 >> 121-145 >> 105 >> n/c - 2h after b'fast: n/c >> 74 >> 126 >> 203 >> n/c  - before lunch: 68, 75-123, 130 >> 74, 81-155, 187, 193 >> 81-120 - 2h after lunch:  79, 86 >> 145 >> 115 >> n/c - before dinner: 83-130, 147, 196 >> 80 >> 91-140, 161 >> n/c >> 99 - 2h after dinner: 120-130 >> n/c >> 110 >> 121 >> n/c - bedtime: 115, 116 >> 94  >> n/c >> 139 >> n/c - nighttime: n/c Lowest sugar was 58 >> ... 68 >> 74 >> 41; she has hypoglycemia awareness in the 70s. Highest sugar was  218 >> 196 >> 161 >> 193 >> 193.  Glucometer: One Touch verio  Pt's meals are: - Breakfast: cheerios + raisin bran or cornflakes + 2% milk or bacon + egg + toast - Lunch: salad or soup or sandwich - Dinner: veggies + starch - Snacks: no desserts; apple, chips She saw a dietitian  07/09/2015-this helped a lot. In 2019 had a pruritic maculopapular generalized rash. She saw PCP and dermatology and no etiology was found. She then saw Dr. Timmy - suspicion for mastocytosis  - BM Bx negative.  She found out she is allergic to meat.  She stopped eating meat and the rash resolved.  Of note, no alpha gal allergy.  She was also investigated for gallstones.  + Mild CKD - now sees nephrology, last BUN/creatinine:  Lab Results  Component Value Date   BUN 24 (H) 04/03/2024   CREATININE 1.51 (H) 04/03/2024   Lab Results  Component Value Date   MICRALBCREAT 161 (H) 11/08/2023   Microalbumin Panel Reviewed date:11/27/2021 09:24:17 AM Interpretation:Abnormal Performing Lab: Notes/Report: Testing Performed at: [facility], [address], [place], [place] [address]  MA/CR ratio 39.1 0.0-30.0 mg/G    UMA 1.37 0.00-1.90 mg/dL    UCR 35        + HL; last set of lipids: 10/09/2023: 182/166/54.4/94   Lab Results  Component Value Date   CHOL 210 (H) 02/12/2020   HDL 44.60 02/12/2020   LDLCALC 131 (H) 11/01/2015   LDLDIRECT 134.0 02/12/2020   TRIG 253.0 (H) 02/12/2020   CHOLHDL 5 02/12/2020  She  is on Zetia 10 mg daily and fish oil 1000 mg twice a day. She tried statins >> Bone pain.  She tried Benecol spread but did not like it.  She also developed a rash from fenofibrate. I suggested a referral to nutrition but she did not have this appointment.   - last eye exam was on 12/12/2023: No DR, but has a history of + DR. She does have drusen and hypertensive retinopathy along with glaucoma.  - No numbness and tingling in her feet.  Last foot exam 05/08/2023.  She also has HTN, OSA, depression, osteoarthritis, spinal stenosis, osteopenia, GERD, CKD stage III. + h/o colon cancer.  Hashimoto's hypothyroidism:  Pt is on levothyroxine  112 mcg daily, taken: - in am - fasting - at least 30 min from b'fast - no calcium - no iron  - + multivitamins  - 1h after LT4 >>  moved 4 hours after LT4 - started PPIs (Omeprazole ) - 1st thing in am! >>  Now moved later in the day - not on Biotin  Reviewed latest TSH levels: 11/26/2023: TSH 0.68 11/23/2022: TSH 0.64 11/21/2021: TSH 0.57 (0.34-4.5) Lab Results  Component Value Date   TSH 0.57 08/24/2021   TSH 0.84 02/12/2020   TSH 2.140 03/22/2018  07/20/2020: TSH 2.22 10/22/2018: TSH 0.76  In 2019 she was found to have elevated ATA antibodies: Lab Results  Component Value Date   THGAB 93.8 (H) 03/22/2018   We checked a thyroid  ultrasound since she has a prominent left thyroid  lobe on palpation: Thyroid  U/S (03/03/2019): no nodules, heterogeneity and atrophy  ROS: + see HPI  I reviewed pt's medications, allergies, PMH, social hx, family hx, and changes were documented in the history of present illness. Otherwise, unchanged from my initial visit note.  Past Medical History:  Diagnosis Date   Anxiety    Arthritis    Chronic kidney disease    Colon cancer (HCC)    colon ca dx 2008 and 2010   Complication of anesthesia    problems waking up-patient thinks she was given too much medication   GERD (gastroesophageal reflux disease)    Gestational diabetes    Headache    Hepatitis    ? Hepatitis at age15   Hyperlipidemia    Hypertension    Hypothyroidism    Sleep apnea    uses CPAP   Urticaria    Past Surgical History:  Procedure Laterality Date   ABDOMINAL HYSTERECTOMY     APPENDECTOMY     BACK SURGERY  2009   CARPAL TUNNEL RELEASE Right    COLON SURGERY     COLONOSCOPY WITH PROPOFOL  N/A 08/10/2015   Procedure: COLONOSCOPY WITH PROPOFOL ;  Surgeon: Gladis MARLA Louder, MD;  Location: [facility];  Service: Endoscopy;  Laterality: N/A;   DILATION AND CURETTAGE OF UTERUS     had before had hysterectomy   ESOPHAGOGASTRODUODENOSCOPY (EGD) WITH PROPOFOL  N/A 08/10/2015   Procedure: ESOPHAGOGASTRODUODENOSCOPY (EGD) WITH PROPOFOL ;  Surgeon: Gladis MARLA Louder, MD;  Location: [facility];  Service:  Endoscopy;  Laterality: N/A;   EYE SURGERY     bilateral cataract surgery    ROTATOR CUFF REPAIR Right 1990   Social History   Socioeconomic History   Marital status: Divorced    Spouse name: Not on file   Number of children: 4   Years of education: 12+   Highest education level: Not on file  Occupational History   Occupation: [employer] room  Tobacco Use   Smoking status: Never   Smokeless  tobacco: Never   Tobacco comments:    never used tobacco  Vaping Use   Vaping status: Never Used  Substance and Sexual Activity   Alcohol use: No    Alcohol/week: 0.0 standard drinks of alcohol   Drug use: No   Sexual activity: Not Currently    Birth control/protection: Surgical  Other Topics Concern   Not on file  Social History Narrative   Lives at home with herself.   Caffeine use: Drinks coffee and tea (1 cup coffee per day). Tea rarely    Social Drivers of Corporate investment banker Strain: Not on file  Food Insecurity: Not on file  Transportation Needs: Not on file  Physical Activity: Not on file  Stress: Not on file  Social Connections: Not on file  Intimate Partner Violence: Not on file   Current Outpatient Medications on File Prior to Visit  Medication Sig Dispense Refill   amLODipine  (NORVASC ) 10 MG tablet Take 10 mg by mouth in the morning.     aspirin 81 MG tablet Take 81 mg by mouth at bedtime.     beta carotene w/minerals (OCUVITE) tablet Take 1 tablet by mouth daily in the afternoon.     budesonide  (ENTOCORT EC ) 3 MG 24 hr capsule Take 3 mg by mouth daily as needed (stomach issues.).     Cholecalciferol (VITAMIN D -3 PO) Take 1 tablet by mouth at bedtime.     Cyanocobalamin (VITAMIN B-12 PO) Take 1 tablet by mouth at bedtime.     empagliflozin  (JARDIANCE ) 10 MG TABS tablet Take 1 tablet (10 mg total) by mouth daily before breakfast. 90 tablet 3   EPINEPHrine 0.3 mg/0.3 mL IJ SOAJ injection See admin instructions.     ezetimibe (ZETIA) 10 MG tablet Take 10 mg by  mouth daily after breakfast.     fexofenadine  (ALLEGRA ) 180 MG tablet Take 180 mg by mouth daily as needed for allergies.     FLUoxetine  (PROZAC ) 20 MG capsule Take 20 mg by mouth daily after breakfast.     fluticasone  (FLONASE ) 50 MCG/ACT nasal spray Place 1 spray into both nostrils daily as needed for allergies.     furosemide (LASIX) 20 MG tablet Take 20 mg by mouth in the morning.     glipiZIDE  (GLUCOTROL  XL) 2.5 MG 24 hr tablet Take 1 tablet (2.5 mg total) by mouth daily with breakfast. 30 tablet 2   Glucosamine HCl 1500 MG TABS Take 1,500 mg by mouth daily in the afternoon.     glucose blood (ONETOUCH VERIO) test strip USE TO CHECK YOU BLOOD SUGAR ONCE OR TWICE DAILY 200 each 3   L-LYSINE PO Take 1 capsule by mouth daily as needed (fever blister).     levothyroxine  (SYNTHROID , LEVOTHROID) 112 MCG tablet Take 112 mcg by mouth daily before breakfast.      losartan  (COZAAR ) 100 MG tablet Take 100 mg by mouth daily after breakfast.     MAGNESIUM PO Take 1 capsule by mouth at bedtime.     methocarbamol  (ROBAXIN ) 500 MG tablet Take 1 tablet (500 mg total) by mouth 3 (three) times daily. 20 tablet 0   Multiple Vitamin (MULTIVITAMIN) capsule Take 1 capsule by mouth daily in the afternoon. Complete Multivitamin for Women     Omega-3 Fatty Acids (FISH OIL PO) Take 1 capsule by mouth daily in the afternoon.     omeprazole  (PRILOSEC) 40 MG capsule Take 40 mg by mouth daily as needed (acid reflux/indigestion.).     oxyCODONE  (  OXY IR/ROXICODONE ) 5 MG immediate release tablet Take 1 tablet (5 mg total) by mouth every 6 (six) hours as needed for severe pain (pain score 7-10). 10 tablet 0   polyvinyl alcohol (LIQUIFILM TEARS) 1.4 % ophthalmic solution Place 1 drop into both eyes as needed for dry eyes.     Probiotic Product (PROBIOTIC DAILY) CAPS Take 1 capsule by mouth at bedtime.     No current facility-administered medications on file prior to visit.   Allergies  Allergen Reactions   Atorvastatin  Other (See Comments)    Bones aching   Celecoxib Other (See Comments)    unknown   Rosuvastatin Other (See Comments)    Bones aching   Ace Inhibitors Cough   Statins     Aches in her bone crestor and lipitor   Family History  Problem Relation Age of Onset   Heart attack Mother 1   Heart disease Sister    Heart attack Brother 11   Heart attack Brother 51   Aneurysm Brother 45   Breast cancer Granddaughter 32   Migraines Neg Hx    PE: BP 138/80   Pulse (!) 58   Ht 5' 2 (1.575 m)   Wt 146 lb 6.4 oz (66.4 kg)   SpO2 97%   BMI 26.78 kg/m   Wt Readings from Last 3 Encounters:  05/07/24 146 lb 6.4 oz (66.4 kg)  04/04/24 145 lb 15.1 oz (66.2 kg)  04/03/24 146 lb (66.2 kg)   Constitutional: normal weight, in NAD, walks with a walker Eyes: EOMI, no exophthalmos ENT:  + slight left thyroid  fullness, no cervical lymphadenopathy Cardiovascular: RRR, No MRG Respiratory: CTA B Musculoskeletal: + Heberden and Bouchard finger deformities Skin: no rashes Neurological: no tremor with outstretched hands Diabetic Foot Exam - Simple   Simple Foot Form Diabetic Foot exam was performed with the following findings: Yes 05/07/2024  9:20 AM  Visual Inspection See comments: Yes Sensation Testing Intact to touch and monofilament testing bilaterally: Yes Pulse Check Posterior Tibialis and Dorsalis pulse intact bilaterally: Yes Comments R>L hallux valgus    ASSESSMENT: 1. DM2, non-insulin -dependent, controlled, with long-term complications -Mild CKD -Diabetic retinopathy  2. HL  3.  Hypothyroidism due to Hashimoto's thyroiditis  PLAN:  1. Patient with history of controlled type 2 diabetes, previously on oral antidiabetic regimen with metformin  only, to which I recommended to add an SGLT2 inhibitor at last visit.  Afterwards, due to the low kidney function, we had to switch from metformin  to glipizide .  She was able to start this approximately 1 month ago.  Sugars improved  afterwards..  At last visit, HbA1c was slightly higher, at 6.6%.  At last visit she also had higher ACR.  Since then, she established care with nephrology. -Since last visit, she had another HbA1c obtained 3 months ago and this was higher, at 7.1%. -At today's visit, sugars are excellent, all within target range, but before starting glipizide  they were increasing to the 150s-190s.  For now, we will continue the current regimen.  She is on the lowest dose of glipizide  available.  I did advise her to let me know if she has lows (she had a blood sugar at 41 but she was not feeling low at that time and she did not recheck her blood sugars).  No other episodes afterwards.  If she does develop low blood sugars, we probably will need to stop glipizide . - I suggested to:  Patient Instructions  Please continue: - Glipizide  ER 2.5  mg before b'fast - Jardiance  10 mg before b'fast  Check sugars 1x a day, rotating check times and write them down.  Continue Levothyroxine  112 mcg daily.  Take the thyroid  hormone every day, with water , at least 30 minutes before breakfast, separated by at least 4 hours from: - acid reflux medications - calcium - iron  - multivitamins  Please return in 3-4 months with your sugar log.   - we checked her HbA1c: 7.0% (lower, but higher than expected from her meter) - advised to check sugars at different times of the day - 1x a day, rotating check times - advised for yearly eye exams >> she is UTD - return to clinic in 3-4 months  2. HL - Latest lipid panel was reviewed from 09/2023: Triglycerides at goal, decreased from 300s, LDL also lower, under 100.   - She did not tolerate statins and had a rash with fenofibrate.  She continues on Zetia 10 mg daily and fish oil 1000 mg twice a day.   3.  Hashimoto's hypothyroidism -In the past, her left thyroid  lobe appeared more prominent on palpation so we checked a thyroid  ultrasound.  This was negative for nodules. - latest  thyroid  labs reviewed with pt. >> normal: 0.68 on 11/26/2023 - she continues on LT4 112 mcg daily - pt feels good on this dose. - we discussed about taking the thyroid  hormone every day, with water , >30 minutes before breakfast, separated by >4 hours from acid reflux medications, calcium, iron , multivitamins. Pt. is taking it correctly now.  At last visit, she was on omeprazole  taken first thing in the morning, before levothyroxine .  I advised her to move this at least 4 hours later.  Lela Fendt, MD PhD [facility] Endocrinology

## 2024-05-12 DIAGNOSIS — M545 Low back pain, unspecified: Secondary | ICD-10-CM | POA: Diagnosis not present

## 2024-05-14 DIAGNOSIS — I129 Hypertensive chronic kidney disease with stage 1 through stage 4 chronic kidney disease, or unspecified chronic kidney disease: Secondary | ICD-10-CM | POA: Diagnosis not present

## 2024-05-14 DIAGNOSIS — E1122 Type 2 diabetes mellitus with diabetic chronic kidney disease: Secondary | ICD-10-CM | POA: Diagnosis not present

## 2024-05-14 DIAGNOSIS — E871 Hypo-osmolality and hyponatremia: Secondary | ICD-10-CM | POA: Diagnosis not present

## 2024-05-14 DIAGNOSIS — D631 Anemia in chronic kidney disease: Secondary | ICD-10-CM | POA: Diagnosis not present

## 2024-05-14 DIAGNOSIS — N1832 Chronic kidney disease, stage 3b: Secondary | ICD-10-CM | POA: Diagnosis not present

## 2024-05-20 DIAGNOSIS — M545 Low back pain, unspecified: Secondary | ICD-10-CM | POA: Diagnosis not present

## 2024-05-30 ENCOUNTER — Encounter: Payer: Self-pay | Admitting: Family

## 2024-05-30 ENCOUNTER — Inpatient Hospital Stay: Admitting: Family

## 2024-05-30 ENCOUNTER — Inpatient Hospital Stay: Attending: Hematology & Oncology

## 2024-05-30 VITALS — BP 157/58 | HR 60 | Temp 98.6°F | Resp 17 | Wt 146.1 lb

## 2024-05-30 DIAGNOSIS — C184 Malignant neoplasm of transverse colon: Secondary | ICD-10-CM

## 2024-05-30 DIAGNOSIS — Z9221 Personal history of antineoplastic chemotherapy: Secondary | ICD-10-CM | POA: Diagnosis not present

## 2024-05-30 DIAGNOSIS — Z09 Encounter for follow-up examination after completed treatment for conditions other than malignant neoplasm: Secondary | ICD-10-CM | POA: Insufficient documentation

## 2024-05-30 DIAGNOSIS — D509 Iron deficiency anemia, unspecified: Secondary | ICD-10-CM | POA: Insufficient documentation

## 2024-05-30 DIAGNOSIS — D508 Other iron deficiency anemias: Secondary | ICD-10-CM

## 2024-05-30 DIAGNOSIS — Z85038 Personal history of other malignant neoplasm of large intestine: Secondary | ICD-10-CM | POA: Diagnosis not present

## 2024-05-30 DIAGNOSIS — C185 Malignant neoplasm of splenic flexure: Secondary | ICD-10-CM

## 2024-05-30 DIAGNOSIS — C183 Malignant neoplasm of hepatic flexure: Secondary | ICD-10-CM

## 2024-05-30 LAB — CBC WITH DIFFERENTIAL (CANCER CENTER ONLY)
Abs Immature Granulocytes: 0.07 K/uL (ref 0.00–0.07)
Basophils Absolute: 0.1 K/uL (ref 0.0–0.1)
Basophils Relative: 1 %
Eosinophils Absolute: 0.2 K/uL (ref 0.0–0.5)
Eosinophils Relative: 3 %
HCT: 39.9 % (ref 36.0–46.0)
Hemoglobin: 13 g/dL (ref 12.0–15.0)
Immature Granulocytes: 1 %
Lymphocytes Relative: 29 %
Lymphs Abs: 2 K/uL (ref 0.7–4.0)
MCH: 29.7 pg (ref 26.0–34.0)
MCHC: 32.6 g/dL (ref 30.0–36.0)
MCV: 91.3 fL (ref 80.0–100.0)
Monocytes Absolute: 0.5 K/uL (ref 0.1–1.0)
Monocytes Relative: 8 %
Neutro Abs: 4 K/uL (ref 1.7–7.7)
Neutrophils Relative %: 58 %
Platelet Count: 237 K/uL (ref 150–400)
RBC: 4.37 MIL/uL (ref 3.87–5.11)
RDW: 12.9 % (ref 11.5–15.5)
WBC Count: 6.8 K/uL (ref 4.0–10.5)
nRBC: 0 % (ref 0.0–0.2)

## 2024-05-30 LAB — CMP (CANCER CENTER ONLY)
ALT: 45 U/L — ABNORMAL HIGH (ref 0–44)
AST: 25 U/L (ref 15–41)
Albumin: 4.6 g/dL (ref 3.5–5.0)
Alkaline Phosphatase: 119 U/L (ref 38–126)
Anion gap: 13 (ref 5–15)
BUN: 27 mg/dL — ABNORMAL HIGH (ref 8–23)
CO2: 24 mmol/L (ref 22–32)
Calcium: 10.2 mg/dL (ref 8.9–10.3)
Chloride: 103 mmol/L (ref 98–111)
Creatinine: 1.59 mg/dL — ABNORMAL HIGH (ref 0.44–1.00)
GFR, Estimated: 32 mL/min — ABNORMAL LOW (ref >60)
Glucose, Bld: 118 mg/dL — ABNORMAL HIGH (ref 70–99)
Potassium: 4.7 mmol/L (ref 3.5–5.1)
Sodium: 139 mmol/L (ref 135–145)
Total Bilirubin: 0.4 mg/dL (ref 0.0–1.2)
Total Protein: 7.2 g/dL (ref 6.5–8.1)

## 2024-05-30 LAB — IRON AND IRON BINDING CAPACITY (CC-WL,HP ONLY)
Iron: 76 ug/dL (ref 28–170)
Saturation Ratios: 19 % (ref 10.4–31.8)
TIBC: 399 ug/dL (ref 250–450)
UIBC: 323 ug/dL

## 2024-05-30 LAB — CEA (ACCESS): CEA (CHCC): 3.13 ng/mL (ref 0.00–5.00)

## 2024-05-30 LAB — RETIC PANEL
Immature Retic Fract: 12.7 % (ref 2.3–15.9)
RBC.: 4.36 MIL/uL (ref 3.87–5.11)
Retic Count, Absolute: 68.9 K/uL (ref 19.0–186.0)
Retic Ct Pct: 1.6 % (ref 0.4–3.1)
Reticulocyte Hemoglobin: 34.7 pg (ref >27.9)

## 2024-05-30 LAB — FERRITIN: Ferritin: 122 ng/mL (ref 11–307)

## 2024-05-30 NOTE — Progress Notes (Signed)
 Hematology and Oncology Follow Up Visit  Robin [name] [id number] 1941/01/25 83 y.o. 05/30/2024   Principle Diagnosis:  History of locally recurrent adenocarcinoma of the colon Iron  deficiency anemia   Current Therapy:        Observation  IV iron  as indicated    Interim History:  [name] is here today for annual follow-up. She is doing well and will be having arthroplasty of the left shoulder on 06/18/2024. She states that she has a burning feeling int hat shoulder.  She also had a fall in the bathtub back in June and fractured her pelvis. This did not require surgery. She has been staying with her daughter and returned home this week.  No issue with fever, chills, n/v, cough, rash, dizziness, SOB, chest pain, palpitations, abdominal pain or changes in bowel or bladder habits.  No swelling in her extremities.  No blood loss or petechiae. She bruises easily on her arms with aspirin but not in excess.  She is followed by [facility] and states that her last colonoscopy was a year or so ago.  No new falls, no syncope.  Appetite and hydration are good. Weight is stable at 146 lbs.   ECOG Performance Status: 1 - Symptomatic but completely ambulatory  Medications:  Allergies as of 05/30/2024       Reactions   Atorvastatin Other (See Comments)   Bones aching   Celecoxib Other (See Comments)   unknown   Rosuvastatin Other (See Comments)   Bones aching   Ace Inhibitors Cough   Statins    Aches in her bone crestor and lipitor        Medication List        Accurate as of May 30, 2024  8:56 AM. If you have any questions, ask your nurse or doctor.          STOP taking these medications    methocarbamol  500 MG tablet Commonly known as: ROBAXIN  Stopped by: Lauraine Pepper   oxyCODONE  5 MG immediate release tablet Commonly known as: Oxy IR/ROXICODONE  Stopped by: Lauraine Pepper       TAKE these medications    amLODipine  10 MG tablet Commonly known as: NORVASC  Take  10 mg by mouth in the morning.   artificial tears ophthalmic solution Place 1 drop into both eyes as needed for dry eyes.   aspirin 81 MG tablet Take 81 mg by mouth at bedtime.   beta carotene w/minerals tablet Take 1 tablet by mouth daily in the afternoon.   budesonide  3 MG 24 hr capsule Commonly known as: ENTOCORT EC  Take 3 mg by mouth daily as needed (stomach issues.).   empagliflozin  10 MG Tabs tablet Commonly known as: Jardiance  Take 1 tablet (10 mg total) by mouth daily before breakfast.   EPINEPHrine 0.3 mg/0.3 mL Soaj injection Commonly known as: EPI-PEN See admin instructions.   ezetimibe 10 MG tablet Commonly known as: ZETIA Take 10 mg by mouth daily after breakfast.   fexofenadine  180 MG tablet Commonly known as: ALLEGRA  Take 180 mg by mouth daily as needed for allergies.   FISH OIL PO Take 1 capsule by mouth daily in the afternoon.   FLUoxetine  20 MG capsule Commonly known as: PROZAC  Take 20 mg by mouth daily after breakfast.   fluticasone  50 MCG/ACT nasal spray Commonly known as: FLONASE  Place 1 spray into both nostrils daily as needed for allergies.   furosemide 20 MG tablet Commonly known as: LASIX Take 20 mg by mouth in the  morning.   glipiZIDE  2.5 MG 24 hr tablet Commonly known as: GLUCOTROL  XL Take 1 tablet (2.5 mg total) by mouth daily with breakfast.   Glucosamine HCl 1500 MG Tabs Take 1,500 mg by mouth daily in the afternoon.   L-LYSINE PO Take 1 capsule by mouth daily as needed (fever blister).   levothyroxine  112 MCG tablet Commonly known as: SYNTHROID  Take 112 mcg by mouth daily before breakfast.   losartan  100 MG tablet Commonly known as: COZAAR  Take 100 mg by mouth daily after breakfast.   MAGNESIUM PO Take 1 capsule by mouth at bedtime.   multivitamin capsule Take 1 capsule by mouth daily in the afternoon. Complete Multivitamin for Women   omeprazole  40 MG capsule Commonly known as: PRILOSEC Take 40 mg by mouth daily  as needed (acid reflux/indigestion.).   OneTouch Verio test strip Generic drug: glucose blood USE TO CHECK YOU BLOOD SUGAR ONCE OR TWICE DAILY   Probiotic Daily Caps Take 1 capsule by mouth at bedtime.   VITAMIN B-12 PO Take 1 tablet by mouth at bedtime.   VITAMIN D -3 PO Take 1 tablet by mouth at bedtime.        Allergies:  Allergies  Allergen Reactions   Atorvastatin Other (See Comments)    Bones aching   Celecoxib Other (See Comments)    unknown   Rosuvastatin Other (See Comments)    Bones aching   Ace Inhibitors Cough   Statins     Aches in her bone crestor and lipitor    Past Medical History, Surgical history, Social history, and Family History were reviewed and updated.  Review of Systems: All other 10 point review of systems is negative.   Physical Exam:  vitals were not taken for this visit.   Wt Readings from Last 3 Encounters:  05/07/24 146 lb 6.4 oz (66.4 kg)  04/04/24 145 lb 15.1 oz (66.2 kg)  04/03/24 146 lb (66.2 kg)    Ocular: Sclerae unicteric, pupils equal, round and reactive to light Ear-nose-throat: Oropharynx clear, dentition fair Lymphatic: No cervical or supraclavicular adenopathy Lungs no rales or rhonchi, good excursion bilaterally Heart regular rate and rhythm, no murmur appreciated Abd soft, nontender, positive bowel sounds MSK no focal spinal tenderness, no joint edema Neuro: non-focal, well-oriented, appropriate affect Breasts: Deferred   Lab Results  Component Value Date   WBC 7.5 04/03/2024   HGB 12.5 04/03/2024   HCT 37.8 04/03/2024   MCV 93.8 04/03/2024   PLT 219 04/03/2024   Lab Results  Component Value Date   FERRITIN 221 11/29/2023   IRON  109 11/29/2023   TIBC 365 11/29/2023   UIBC 256 11/29/2023   IRONPCTSAT 30 11/29/2023   Lab Results  Component Value Date   RETICCTPCT 1.5 11/29/2023   RBC 4.03 04/03/2024   RETICCTABS 48.7 12/17/2008   No results found for: KPAFRELGTCHN, LAMBDASER,  KAPLAMBRATIO No results found for: IGGSERUM, IGA, IGMSERUM No results found for: STEPHANY CARLOTA BENSON MARKEL EARLA JOANNIE DOC VICK, SPEI   Chemistry      Component Value Date/Time   NA 135 04/03/2024 0845   NA 142 03/22/2018 1059   NA 139 05/21/2017 0838   K 4.1 04/03/2024 0845   K 4.3 05/21/2017 0838   CL 101 04/03/2024 0845   CL 102 05/18/2016 1505   CL 100 11/01/2015 0932   CO2 25 04/03/2024 0845   CO2 28 05/21/2017 0838   BUN 24 (H) 04/03/2024 0845   BUN 13 03/22/2018 1059   BUN 20.8  05/21/2017 0838   CREATININE 1.51 (H) 04/03/2024 0845   CREATININE 1.25 (H) 11/29/2023 1159   CREATININE 1.0 05/21/2017 0838      Component Value Date/Time   CALCIUM 9.4 04/03/2024 0845   CALCIUM 9.8 12/03/2023 1138   CALCIUM 10.5 (H) 05/21/2017 0838   ALKPHOS 37 (L) 11/29/2023 1159   ALKPHOS 49 05/21/2017 0838   AST 14 (L) 11/29/2023 1159   AST 22 05/21/2017 0838   ALT 19 11/29/2023 1159   ALT 33 05/21/2017 0838   BILITOT 0.7 11/29/2023 1159   BILITOT 0.57 05/21/2017 0838       Impression and Plan: Ms. Bowditch is a very pleasant 83 yo caucasian female with history of locally recurrent adenocarcinoma of the colon with resection in 2000. She received adjuvant FOLFOX.  So far she has done well and there has been no evidence of recurrence.  She also has history of IDA. Iron  studies are pending. We will replace if needed.  Follow-up in 6 months.    Lauraine Pepper, NP 9/5/20258:56 AM

## 2024-06-10 NOTE — Patient Instructions (Signed)
 SURGICAL WAITING ROOM VISITATION  Patients having surgery or a procedure may have no more than 2 support people in the waiting area - these visitors may rotate.    Children under the age of 58 must have an adult with them who is not the patient.  Visitors with respiratory illnesses are discouraged from visiting and should remain at home.  If the patient needs to stay at the hospital during part of their recovery, the visitor guidelines for inpatient rooms apply. Pre-op nurse will coordinate an appropriate time for 1 support person to accompany patient in pre-op.  This support person may not rotate.    Please refer to the [facility] website for the visitor guidelines for Inpatients (after your surgery is over and you are in a regular room).       Your procedure is scheduled on: 06/18/24   Report to [facility] Main Entrance    Report to admitting at 7:30 AM   Call this number if you have problems the morning of surgery [phone]   Do not eat food or drink liquids :After Midnight. But may have sips of water with meds.     Oral Hygiene is also important to reduce your risk of infection.                                    Remember - BRUSH YOUR TEETH THE MORNING OF SURGERY WITH YOUR REGULAR TOOTHPASTE   Stop all vitamins and herbal supplements 7 days before surgery.   Take these medicines the morning of surgery with A SIP OF WATER: amlodipine , ezetimibe(zetia), fexofenadine , fluoxetine (prozac ), nasal spray, synthroid , omeprazole               Do not take lasix or losartan  the morning of surgery.  DO NOT TAKE ANY ORAL DIABETIC MEDICATIONS DAY OF YOUR SURGERY Do not take Glipizide  the morning of surgery. Hold Jardiance  for 72 hours prior to surgery. Last dose to be 06/14/24             You may not have any metal on your body including hair pins, jewelry, and body piercing             Do not wear make-up, lotions, powders, perfumes/cologne, or deodorant  Do not wear nail  polish including gel and S&S, artificial/acrylic nails, or any other type of covering on natural nails including finger and toenails. If you have artificial nails, gel coating, etc. that needs to be removed by a nail salon please have this removed prior to surgery or surgery may need to be canceled/ delayed if the surgeon/ anesthesia feels like they are unable to be safely monitored.   Do not shave  48 hours prior to surgery.    Do not bring valuables to the hospital. [facility] IS NOT             RESPONSIBLE   FOR VALUABLES.   Contacts, glasses, dentures or bridgework may not be worn into surgery.  DO NOT BRING YOUR HOME MEDICATIONS TO THE HOSPITAL. PHARMACY WILL DISPENSE MEDICATIONS LISTED ON YOUR MEDICATION LIST TO YOU DURING YOUR ADMISSION IN THE HOSPITAL!    Patients discharged on the day of surgery will not be allowed to drive home.  Someone NEEDS to stay with you for the first 24 hours after anesthesia.   Special Instructions: Bring a copy of your healthcare power of attorney and living will documents  the day of surgery if you haven't scanned them before.              Please read over the following fact sheets you were given: IF YOU HAVE QUESTIONS ABOUT YOUR PRE-OP INSTRUCTIONS PLEASE CALL [phone] Robin [name]   If you received a COVID test during your pre-op visit  it is requested that you wear a mask when out in public, stay away from anyone that may not be feeling well and notify your surgeon if you develop symptoms. If you test positive for Covid or have been in contact with anyone that has tested positive in the last 10 days please notify you surgeon.      Pre-operative 5 CHG Bath Instructions   You can play a key role in reducing the risk of infection after surgery. Your skin needs to be as free of germs as possible. You can reduce the number of germs on your skin by washing with CHG (chlorhexidine  gluconate) soap before surgery. CHG is an antiseptic soap that kills germs and  continues to kill germs even after washing.   DO NOT use if you have an allergy to chlorhexidine /CHG or antibacterial soaps. If your skin becomes reddened or irritated, stop using the CHG and notify one of our RNs at [phone].   Please shower with the CHG soap starting 4 days before surgery using the following schedule:     Please keep in mind the following:  DO NOT shave, including legs and underarms, starting the day of your first shower.   You may shave your face at any point before/day of surgery.  Place clean sheets on your bed the day you start using CHG soap. Use a clean washcloth (not used since being washed) for each shower. DO NOT sleep with pets once you start using the CHG.   CHG Shower Instructions:  If you choose to wash your hair and private area, wash first with your normal shampoo/soap.  After you use shampoo/soap, rinse your hair and body thoroughly to remove shampoo/soap residue.  Turn the water OFF and apply about 3 tablespoons (45 ml) of CHG soap to a CLEAN washcloth.  Apply CHG soap ONLY FROM YOUR NECK DOWN TO YOUR TOES (washing for 3-5 minutes)  DO NOT use CHG soap on face, private areas, open wounds, or sores.  Pay special attention to the area where your surgery is being performed.  If you are having back surgery, having someone wash your back for you may be helpful. Wait 2 minutes after CHG soap is applied, then you may rinse off the CHG soap.  Pat dry with a clean towel  Put on clean clothes/pajamas   If you choose to wear lotion, please use ONLY the CHG-compatible lotions on the back of this paper.     Additional instructions for the day of surgery: DO NOT APPLY any lotions, deodorants, cologne, or perfumes.   Put on clean/comfortable clothes.  Brush your teeth.  Ask your nurse before applying any prescription medications to the skin.      CHG Compatible Lotions   Aveeno Moisturizing lotion  Cetaphil Moisturizing Cream  Cetaphil Moisturizing  Lotion  Clairol Herbal Essence Moisturizing Lotion, Dry Skin  Clairol Herbal Essence Moisturizing Lotion, Extra Dry Skin  Clairol Herbal Essence Moisturizing Lotion, Normal Skin  Curel Age Defying Therapeutic Moisturizing Lotion with Alpha Hydroxy  Curel Extreme Care Body Lotion  Curel Soothing Hands Moisturizing Hand Lotion  Curel Therapeutic Moisturizing Cream, Fragrance-Free  Curel Therapeutic Moisturizing Lotion,  Fragrance-Free  Curel Therapeutic Moisturizing Lotion, Original Formula  Eucerin Daily Replenishing Lotion  Eucerin Dry Skin Therapy Plus Alpha Hydroxy Crme  Eucerin Dry Skin Therapy Plus Alpha Hydroxy Lotion  Eucerin Original Crme  Eucerin Original Lotion  Eucerin Plus Crme Eucerin Plus Lotion  Eucerin TriLipid Replenishing Lotion  Keri Anti-Bacterial Hand Lotion  Keri Deep Conditioning Original Lotion Dry Skin Formula Softly Scented  Keri Deep Conditioning Original Lotion, Fragrance Free Sensitive Skin Formula  Keri Lotion Fast Absorbing Fragrance Free Sensitive Skin Formula  Keri Lotion Fast Absorbing Softly Scented Dry Skin Formula  Keri Original Lotion  Keri Skin Renewal Lotion Keri Silky Smooth Lotion  Keri Silky Smooth Sensitive Skin Lotion  Nivea Body Creamy Conditioning Oil  Nivea Body Extra Enriched Lotion  Nivea Body Original Lotion  Nivea Body Sheer Moisturizing Lotion Nivea Crme  Nivea Skin Firming Lotion  NutraDerm 30 Skin Lotion  NutraDerm Skin Lotion  NutraDerm Therapeutic Skin Cream  NutraDerm Therapeutic Skin Lotion  ProShield Protective Hand Cream    Incentive Spirometer  An incentive spirometer is a tool that can help keep your lungs clear and active. This tool measures how well you are filling your lungs with each breath. Taking long deep breaths may help reverse or decrease the chance of developing breathing (pulmonary) problems (especially infection) following: A long period of time when you are unable to move or be active. BEFORE THE  PROCEDURE  If the spirometer includes an indicator to show your best effort, your nurse or respiratory therapist will set it to a desired goal. If possible, sit up straight or lean slightly forward. Try not to slouch. Hold the incentive spirometer in an upright position. INSTRUCTIONS FOR USE  Sit on the edge of your bed if possible, or sit up as far as you can in bed or on a chair. Hold the incentive spirometer in an upright position. Breathe out normally. Place the mouthpiece in your mouth and seal your lips tightly around it. Breathe in slowly and as deeply as possible, raising the piston or the ball toward the top of the column. Hold your breath for 3-5 seconds or for as long as possible. Allow the piston or ball to fall to the bottom of the column. Remove the mouthpiece from your mouth and breathe out normally. Rest for a few seconds and repeat Steps 1 through 7 at least 10 times every 1-2 hours when you are awake. Take your time and take a few normal breaths between deep breaths. The spirometer may include an indicator to show your best effort. Use the indicator as a goal to work toward during each repetition. After each set of 10 deep breaths, practice coughing to be sure your lungs are clear. If you have an incision (the cut made at the time of surgery), support your incision when coughing by placing a pillow or rolled up towels firmly against it. Once you are able to get out of bed, walk around indoors and cough well. You may stop using the incentive spirometer when instructed by your caregiver.  RISKS AND COMPLICATIONS Take your time so you do not get dizzy or light-headed. If you are in pain, you may need to take or ask for pain medication before doing incentive spirometry. It is harder to take a deep breath if you are having pain. AFTER USE Rest and breathe slowly and easily. It can be helpful to keep track of a log of your progress. Your caregiver can provide you with a simple table  to help with this. If you are using the spirometer at home, follow these instructions: SEEK MEDICAL CARE IF:  You are having difficultly using the spirometer. You have trouble using the spirometer as often as instructed. Your pain medication is not giving enough relief while using the spirometer. You develop fever of 100.5 F (38.1 C) or higher. SEEK IMMEDIATE MEDICAL CARE IF:  You cough up bloody sputum that had not been present before. You develop fever of 102 F (38.9 C) or greater. You develop worsening pain at or near the incision site. MAKE SURE YOU:  Understand these instructions. Will watch your condition. Will get help right away if you are not doing well or get worse. Document Released: 01/22/2007 Document Revised: 12/04/2011 Document Reviewed: 03/25/2007   How to Manage Your Diabetes Before and After Surgery  Why is it important to control my blood sugar before and after surgery? Improving blood sugar levels before and after surgery helps healing and can limit problems. A way of improving blood sugar control is eating a healthy diet by:  Eating less sugar and carbohydrates  Increasing activity/exercise  Talking with your doctor about reaching your blood sugar goals High blood sugars (greater than 180 mg/dL) can raise your risk of infections and slow your recovery, so you will need to focus on controlling your diabetes during the weeks before surgery. Make sure that the doctor who takes care of your diabetes knows about your planned surgery including the date and location.  How do I manage my blood sugar before surgery? Check your blood sugar at least 4 times a day, starting 2 days before surgery, to make sure that the level is not too high or low. Check your blood sugar the morning of your surgery when you wake up and every 2 hours until you get to the Short Stay unit. If your blood sugar is less than 70 mg/dL, you will need to treat for low blood sugar: Do not take  insulin . Treat a low blood sugar (less than 70 mg/dL) with  cup of clear juice (cranberry or apple), 4 glucose tablets, OR glucose gel. Recheck blood sugar in 15 minutes after treatment (to make sure it is greater than 70 mg/dL). If your blood sugar is not greater than 70 mg/dL on recheck, call [phone] for further instructions. Report your blood sugar to the short stay nurse when you get to Short Stay.  If you are admitted to the hospital after surgery: Your blood sugar will be checked by the staff and you will probably be given insulin  after surgery (instead of oral diabetes medicines) to make sure you have good blood sugar levels. The goal for blood sugar control after surgery is 80-180 mg/dL.   WHAT DO I DO ABOUT MY DIABETES MEDICATION?  Do not take oral diabetes medicines (pills) the morning of surgery.  Hold Glipizide  the morning of surgery. Hold Jardiance  for 72 hours prior to surgery. Last dose 06/14/24   DO NOT TAKE THE FOLLOWING 7 DAYS PRIOR TO SURGERY: Ozempic, Wegovy, Rybelsus (Semaglutide), Byetta (exenatide), Bydureon (exenatide ER), Victoza, Saxenda (liraglutide), or Trulicity (dulaglutide) Mounjaro (Tirzepatide) Adlyxin (Lixisenatide), Polyethylene Glycol Loxenatide.    Reviewed and Endorsed by [facility] Patient Education Committee, August 2015[facility]- Preparing for Total Shoulder Arthroplasty    Before surgery, you can play an important role. Because skin is not sterile, your skin needs to be as free of germs as possible. You can reduce the number of germs on your skin by using the  following products. Benzoyl Peroxide Gel Reduces the number of germs present on the skin Applied twice a day to shoulder area starting two days before surgery    ==================================================================  Please follow these instructions carefully:  BENZOYL PEROXIDE 5% GEL  Please do not use if you have an allergy to benzoyl peroxide.   If your skin  becomes reddened/irritated stop using the benzoyl peroxide.  Starting two days before surgery, apply as follows: Apply benzoyl peroxide in the morning and at night. Apply after taking a shower. If you are not taking a shower clean entire shoulder front, back, and side along with the armpit with a clean wet washcloth.  Place a quarter-sized dollop on your shoulder and rub in thoroughly, making sure to cover the front, back, and side of your shoulder, along with the armpit.   2 days before ____ AM   ____ PM              1 day before ____ AM   ____ PM                         Do this twice a day for two days.  (Last application is the night before surgery, AFTER using the CHG soap as described below).  Do NOT apply benzoyl peroxide gel on the day of surgery.

## 2024-06-10 NOTE — Progress Notes (Addendum)
 COVID Vaccine received:  []  No [x]  Yes Date of any COVID positive Test in last 90 days: no PCP - Olam Pinal MD Cardiologist - Dr. Peter [place]  Chest x-ray - 10/29/23 epic EKG -  11/02/23 Epic Stress Test -  ECHO -  Cardiac Cath -   Cardiac clearance 01/23/24-  Dayna Dunn PA-C  Bowel Prep - [x]  No  []   Yes ______  Pacemaker / ICD device [x]  No []  Yes   Spinal Cord Stimulator:[x]  No []  Yes       History of Sleep Apnea? []  No [x]  Yes   CPAP used?- []  No [x]  Yes    Does the patient monitor blood sugar?          []  No [x]  Yes  []  N/A  Patient has: []  NO Hx DM   []  Pre-DM                 []  DM1  [x]   DM2 Does patient have a [employer] or Dexacom? []  No []  Yes   Fasting Blood Sugar Ranges- 80-120 Checks Blood Sugar __1___ times a day  GLP1 agonist / usual dose - no GLP1 instructions:  SGLT-2 inhibitors / usual dose - Jardiance  Hold x 72 hours prior to surgery. Last dose 06/14/24. SGLT-2 instructions:   Blood Thinner / Instructions:no Aspirin Instructions:ASA 81 mg Hold for 5 days. Last dose 06/12/24  Comments:   Activity level: Patient is able to climb a flight of stairs without difficulty; [x]  No CP  [x]  No SOB,    Patient can perform ADLs without assistance.   Anesthesia review:   Patient denies shortness of breath, fever, cough and chest pain at PAT appointment.  Patient verbalized understanding and agreement to the Pre-Surgical Instructions that were given to them at this PAT appointment. Patient was also educated of the need to review these PAT instructions again prior to his/her surgery.I reviewed the appropriate phone numbers to call if they have any and questions or concerns.

## 2024-06-11 NOTE — Care Plan (Signed)
 Ortho Bundle Case Management Note  Patient Details  Name: Robin [name] MRN: [id number] Date of Birth: [birth year]   spoke with patient. she will discharge to her daughter's home. no DME needed. she is deciding on OPPT. will return call to tell me. discharge instructions discussed and questions answered.  Patient and MD in agreement with plan. Choice offered.                  DME Arranged:    DME Agency:     HH Arranged:    HH Agency:     Additional Comments: Please contact me with any questions of if this plan should need to change.  Charlies Pitch,  RN,BSN,MHA,CCM  [facility]  [phone] 06/11/2024, 9:31 AM

## 2024-06-12 ENCOUNTER — Encounter (HOSPITAL_COMMUNITY)
Admission: RE | Admit: 2024-06-12 | Discharge: 2024-06-12 | Disposition: A | Discharge status: Home / Self Care | Source: Ambulatory Visit | Attending: Orthopaedic Surgery | Admitting: Orthopaedic Surgery

## 2024-06-12 ENCOUNTER — Other Ambulatory Visit: Payer: Self-pay

## 2024-06-12 ENCOUNTER — Encounter (HOSPITAL_COMMUNITY): Payer: Self-pay

## 2024-06-12 VITALS — BP 112/56 | HR 58 | Temp 98.0°F | Resp 16 | Ht 62.0 in | Wt 145.0 lb

## 2024-06-12 DIAGNOSIS — E1121 Type 2 diabetes mellitus with diabetic nephropathy: Secondary | ICD-10-CM | POA: Insufficient documentation

## 2024-06-12 DIAGNOSIS — Z01812 Encounter for preprocedural laboratory examination: Secondary | ICD-10-CM | POA: Insufficient documentation

## 2024-06-12 DIAGNOSIS — Z01818 Encounter for other preprocedural examination: Secondary | ICD-10-CM

## 2024-06-12 HISTORY — DX: Depression, unspecified: F32.A

## 2024-06-12 LAB — SURGICAL PCR SCREEN
MRSA, PCR: NEGATIVE
Staphylococcus aureus: POSITIVE — AB

## 2024-06-12 LAB — GLUCOSE, CAPILLARY: Glucose-Capillary: 119 mg/dL — ABNORMAL HIGH (ref 70–99)

## 2024-06-13 NOTE — Progress Notes (Signed)
 Request sent to Dr. Cristy to review pt's pre op PCR done 06/12/24.

## 2024-06-16 ENCOUNTER — Other Ambulatory Visit: Payer: Self-pay | Admitting: Internal Medicine

## 2024-06-17 NOTE — H&P (Signed)
 PREOPERATIVE H&P  Chief Complaint: osteoarthritis of left shoulder  HPI: Robin [name] is a 83 y.o. female who is scheduled for Procedure(s): ARTHROPLASTY, SHOULDER, TOTAL, REVERSE.   Patient has a past medical history significant for CKD, GERD, HLD, HTN, OSA on CPAP.   Patient has had left shoulder pain for quite some time. It started getting worse over the last year. She has limited ROM. This affects her activities of daily living.   Symptoms are rated as moderate to severe, and have been worsening.  This is significantly impairing activities of daily living.    Please see clinic note for further details on this patient's care.    She has elected for surgical management.   Past Medical History:  Diagnosis Date   Anxiety    Arthritis    Chronic kidney disease    Colon cancer (HCC)    colon ca dx 2008 and 2010   Complication of anesthesia    problems waking up-patient thinks she was given too much medication   Depression    GERD (gastroesophageal reflux disease)    Gestational diabetes    Headache    Hepatitis    ? Hepatitis at age15   Hyperlipidemia    Hypertension    Hypothyroidism    Sleep apnea    uses CPAP   Urticaria    Past Surgical History:  Procedure Laterality Date   ABDOMINAL HYSTERECTOMY     APPENDECTOMY     BACK SURGERY  2009   CARPAL TUNNEL RELEASE Right    COLON SURGERY     COLONOSCOPY WITH PROPOFOL  N/A 08/10/2015   Procedure: COLONOSCOPY WITH PROPOFOL ;  Surgeon: Gladis MARLA Louder, MD;  Location: [facility];  Service: Endoscopy;  Laterality: N/A;   DILATION AND CURETTAGE OF UTERUS     had before had hysterectomy   ESOPHAGOGASTRODUODENOSCOPY (EGD) WITH PROPOFOL  N/A 08/10/2015   Procedure: ESOPHAGOGASTRODUODENOSCOPY (EGD) WITH PROPOFOL ;  Surgeon: Gladis MARLA Louder, MD;  Location: [facility];  Service: Endoscopy;  Laterality: N/A;   EYE SURGERY     bilateral cataract surgery    ROTATOR CUFF REPAIR Right 1990   Social History    Socioeconomic History   Marital status: Divorced    Spouse name: Not on file   Number of children: 4   Years of education: 12+   Highest education level: Not on file  Occupational History   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   Caffeine use: Drinks coffee and tea (1 cup coffee per day). Tea rarely    Social Drivers of Corporate investment banker Strain: Not on file  Food Insecurity: Not on file  Transportation Needs: Not on file  Physical Activity: Not on file  Stress: Not on file  Social Connections: Not on file   Family History  Problem Relation Age of Onset   Heart attack Mother 37   Heart disease Sister    Heart attack Brother 13   Heart attack Brother 64   Aneurysm Brother  45   Breast cancer Granddaughter 32   Migraines Neg Hx    Allergies  Allergen Reactions   Atorvastatin Other (See Comments)    Bones aching   Celecoxib Other (See Comments)    unknown   Rosuvastatin Other (See Comments)    Bones aching   Ace Inhibitors Cough   Statins     Aches in her bone crestor and lipitor   Prior to Admission medications   Medication Sig Start Date End Date Taking? Authorizing Provider  amLODipine  (NORVASC ) 10 MG tablet Take 10 mg by mouth in the morning. 07/28/11  Yes [provider]  aspirin 81 MG tablet Take 81 mg by mouth at bedtime.   Yes [provider]  beta carotene w/minerals (OCUVITE) tablet Take 1 tablet by mouth daily in the afternoon.   Yes [provider]  budesonide  (ENTOCORT EC ) 3 MG 24 hr capsule Take 3 mg by  mouth daily as needed (stomach issues.). 05/25/20  Yes [provider]  Cholecalciferol (VITAMIN D -3 PO) Take 1 tablet by mouth at bedtime.   Yes [provider]  Cyanocobalamin (VITAMIN B-12 PO) Take 1 tablet by mouth at bedtime.   Yes [provider]  empagliflozin  (JARDIANCE ) 10 MG TABS tablet Take 1 tablet (10 mg total) by mouth daily before breakfast. 12/17/23  Yes Trixie File, MD  ezetimibe (ZETIA) 10 MG tablet Take 10 mg by mouth daily after breakfast. 07/28/20  Yes [provider]  fexofenadine  (ALLEGRA ) 180 MG tablet Take 180 mg by mouth daily as needed for allergies.   Yes [provider]  FLUoxetine  (PROZAC ) 20 MG capsule Take 20 mg by mouth daily after breakfast. 07/27/11  Yes [provider]  fluticasone  (FLONASE ) 50 MCG/ACT nasal spray Place 1 spray into both nostrils daily as needed for allergies. 10/28/18  Yes [provider]  furosemide (LASIX) 20 MG tablet Take 20 mg by mouth in the morning. 01/14/24  Yes [provider]  Glucosamine HCl 1500 MG TABS Take 1,500 mg by mouth daily in the afternoon.   Yes [provider]  L-LYSINE PO Take 1 capsule by mouth daily as needed (fever blister).   Yes [provider]  levothyroxine  (SYNTHROID , LEVOTHROID) 112 MCG tablet Take 112 mcg by mouth daily before breakfast.  06/24/15  Yes [provider]  losartan  (COZAAR ) 100 MG tablet Take 100 mg by mouth daily after breakfast. 11/06/23  Yes [place], Peter M, MD  MAGNESIUM PO Take 1 capsule by mouth at bedtime.   Yes [provider]  Multiple Vitamin (MULTIVITAMIN) capsule Take 1 capsule by mouth daily in the afternoon. Complete Multivitamin for Women   Yes [provider]  Omega-3 Fatty Acids (FISH OIL PO) Take 1 capsule by mouth daily in the afternoon.   Yes [provider]  omeprazole  (PRILOSEC) 40 MG capsule Take 40 mg by mouth daily as needed (acid reflux/indigestion.).    Yes [provider]  polyvinyl alcohol (LIQUIFILM TEARS) 1.4 % ophthalmic solution Place 1 drop into both eyes as needed for dry eyes.   Yes [provider]  Probiotic Product (PROBIOTIC DAILY) CAPS Take 1 capsule by mouth at bedtime.   Yes [provider]  spironolactone (ALDACTONE) 25 MG tablet Take 12.5 mg by mouth daily. 05/01/24  Yes [provider]  EPINEPHrine 0.3 mg/0.3 mL IJ SOAJ injection See admin instructions. Patient not taking: Reported on 06/12/2024 05/22/18   [provider]  glipiZIDE  (GLUCOTROL  XL) 2.5 MG 24 hr  tablet Take 1 tablet by mouth once daily with breakfast 06/16/24   Trixie File, MD  glucose blood (ONETOUCH VERIO) test strip USE TO CHECK YOU BLOOD SUGAR ONCE OR TWICE DAILY 11/08/23   Trixie File, MD    ROS: All other systems have been reviewed and were otherwise negative with the exception of those mentioned in the HPI and as above.  Physical Exam: General: Alert, no acute distress Cardiovascular: No pedal edema Respiratory: No cyanosis, no use of accessory musculature GI: No organomegaly, abdomen is soft and non-tender Skin: No lesions in the area of chief complaint Neurologic: Sensation intact distally Psychiatric: Patient is competent for consent with normal mood and affect Lymphatic: No axillary or cervical lymphadenopathy  MUSCULOSKELETAL:  Left shoulder: active forward elevation to 80 degrees. Passive to 100 degrees. External rotation to 30 degrees. Internal rotation to L4. Weakness with cuff strength testing.   Imaging: Xrays demonstrate end stage glenohumeral arthritis with erosion of the joint  BMI: Estimated body mass index is 26.52 kg/m as calculated from the following:   Height as of 06/12/24: 5' 2 (1.575 m).   Weight as of 06/12/24: 65.8 kg.  Lab Results  Component Value Date   ALBUMIN 4.6 05/30/2024   Diabetes:   Patient has a diagnosis of diabetes,  Lab Results  Component Value Date    HGBA1C 7.0 (A) 05/07/2024   Smoking Status:   reports that she has never smoked. She has never used smokeless tobacco.     Assessment: osteoarthritis of left shoulder  Plan: Plan for Procedure(s): ARTHROPLASTY, SHOULDER, TOTAL, REVERSE  The risks benefits and alternatives were discussed with the patient including but not limited to the risks of nonoperative treatment, versus surgical intervention including infection, bleeding, nerve injury,  blood clots, cardiopulmonary complications, morbidity, mortality, among others, and they were willing to proceed.   We additionally specifically discussed risks of axillary nerve injury, infection, periprosthetic fracture, continued pain and longevity of implants prior to beginning procedure.    Patient will be closely monitored in PACU for medical stabilization and pain control. If found stable in PACU, patient may be discharged home with outpatient follow-up. If any concerns regarding patient's stabilization patient will be admitted for observation after surgery. The patient is planning to be discharged home with outpatient PT.   The patient acknowledged the explanation, agreed to proceed with the plan and consent was signed.   Operative Plan: Left reverse total shoulder arthroplasty  Discharge Medications: standard DVT Prophylaxis: aspirin Physical Therapy: outpatient PT Special Discharge needs: Sling (should bring with her). [address]   Aleck LOISE Stalling, PA-C  06/17/2024 8:46 PM

## 2024-06-18 ENCOUNTER — Ambulatory Visit (HOSPITAL_COMMUNITY): Admitting: Anesthesiology

## 2024-06-18 ENCOUNTER — Encounter (HOSPITAL_COMMUNITY): Payer: Self-pay | Admitting: Orthopaedic Surgery

## 2024-06-18 ENCOUNTER — Encounter (HOSPITAL_COMMUNITY)
Admission: RE | Disposition: A | Discharge status: Home / Self Care | Payer: Self-pay | Source: Home / Self Care | Attending: Orthopaedic Surgery

## 2024-06-18 ENCOUNTER — Ambulatory Visit (HOSPITAL_COMMUNITY)

## 2024-06-18 ENCOUNTER — Other Ambulatory Visit: Payer: Self-pay

## 2024-06-18 ENCOUNTER — Ambulatory Visit (HOSPITAL_COMMUNITY)
Admission: RE | Admit: 2024-06-18 | Discharge: 2024-06-18 | Disposition: A | Discharge status: Home / Self Care | Attending: Orthopaedic Surgery | Admitting: Orthopaedic Surgery

## 2024-06-18 DIAGNOSIS — K219 Gastro-esophageal reflux disease without esophagitis: Secondary | ICD-10-CM | POA: Diagnosis not present

## 2024-06-18 DIAGNOSIS — N189 Chronic kidney disease, unspecified: Secondary | ICD-10-CM | POA: Diagnosis not present

## 2024-06-18 DIAGNOSIS — M75102 Unspecified rotator cuff tear or rupture of left shoulder, not specified as traumatic: Secondary | ICD-10-CM | POA: Insufficient documentation

## 2024-06-18 DIAGNOSIS — M19012 Primary osteoarthritis, left shoulder: Secondary | ICD-10-CM | POA: Diagnosis not present

## 2024-06-18 DIAGNOSIS — E1122 Type 2 diabetes mellitus with diabetic chronic kidney disease: Secondary | ICD-10-CM | POA: Insufficient documentation

## 2024-06-18 DIAGNOSIS — F419 Anxiety disorder, unspecified: Secondary | ICD-10-CM | POA: Diagnosis not present

## 2024-06-18 DIAGNOSIS — M25712 Osteophyte, left shoulder: Secondary | ICD-10-CM | POA: Diagnosis not present

## 2024-06-18 DIAGNOSIS — F32A Depression, unspecified: Secondary | ICD-10-CM | POA: Diagnosis not present

## 2024-06-18 DIAGNOSIS — G4730 Sleep apnea, unspecified: Secondary | ICD-10-CM

## 2024-06-18 DIAGNOSIS — E785 Hyperlipidemia, unspecified: Secondary | ICD-10-CM | POA: Insufficient documentation

## 2024-06-18 DIAGNOSIS — E039 Hypothyroidism, unspecified: Secondary | ICD-10-CM | POA: Insufficient documentation

## 2024-06-18 DIAGNOSIS — Z7984 Long term (current) use of oral hypoglycemic drugs: Secondary | ICD-10-CM | POA: Diagnosis not present

## 2024-06-18 DIAGNOSIS — I129 Hypertensive chronic kidney disease with stage 1 through stage 4 chronic kidney disease, or unspecified chronic kidney disease: Secondary | ICD-10-CM | POA: Insufficient documentation

## 2024-06-18 DIAGNOSIS — G4733 Obstructive sleep apnea (adult) (pediatric): Secondary | ICD-10-CM | POA: Diagnosis not present

## 2024-06-18 DIAGNOSIS — G8918 Other acute postprocedural pain: Secondary | ICD-10-CM | POA: Diagnosis not present

## 2024-06-18 DIAGNOSIS — Z471 Aftercare following joint replacement surgery: Secondary | ICD-10-CM | POA: Diagnosis not present

## 2024-06-18 DIAGNOSIS — Z96612 Presence of left artificial shoulder joint: Secondary | ICD-10-CM | POA: Diagnosis not present

## 2024-06-18 HISTORY — PX: REVERSE SHOULDER ARTHROPLASTY: SHX5054

## 2024-06-18 LAB — GLUCOSE, CAPILLARY: Glucose-Capillary: 139 mg/dL — ABNORMAL HIGH (ref 70–99)

## 2024-06-18 SURGERY — ARTHROPLASTY, SHOULDER, TOTAL, REVERSE
Anesthesia: General | Site: Shoulder | Laterality: Left

## 2024-06-18 MED ORDER — BUPIVACAINE LIPOSOME 1.3 % IJ SUSP
INTRAMUSCULAR | Status: DC | PRN
Start: 2024-06-18 — End: 2024-06-18
  Administered 2024-06-18: 10 mL

## 2024-06-18 MED ORDER — INSULIN ASPART 100 UNIT/ML IJ SOLN: 0.0000 [IU] | INTRAMUSCULAR | Status: DC | PRN

## 2024-06-18 MED ORDER — CEFAZOLIN SODIUM-DEXTROSE 2-4 GM/100ML-% IV SOLN
2.0000 g | INTRAVENOUS | Status: AC
  Administered 2024-06-18: 2 g via INTRAVENOUS
  Filled 2024-06-18: qty 100

## 2024-06-18 MED ORDER — SUGAMMADEX SODIUM 200 MG/2ML IV SOLN
INTRAVENOUS | Status: AC
  Filled 2024-06-18: qty 2

## 2024-06-18 MED ORDER — ROCURONIUM BROMIDE 10 MG/ML (PF) SYRINGE
PREFILLED_SYRINGE | INTRAVENOUS | Status: AC
Start: 2024-06-18 — End: 2024-06-18
  Filled 2024-06-18: qty 10

## 2024-06-18 MED ORDER — ORAL CARE MOUTH RINSE: 15.0000 mL | Freq: Once | OROMUCOSAL | Status: AC

## 2024-06-18 MED ORDER — FENTANYL CITRATE PF 50 MCG/ML IJ SOSY
50.0000 ug | PREFILLED_SYRINGE | INTRAMUSCULAR | Status: DC
  Administered 2024-06-18: 50 ug via INTRAVENOUS

## 2024-06-18 MED ORDER — CHLORHEXIDINE GLUCONATE 0.12 % MT SOLN
15.0000 mL | Freq: Once | OROMUCOSAL | Status: AC
  Administered 2024-06-18: 15 mL via OROMUCOSAL

## 2024-06-18 MED ORDER — VANCOMYCIN HCL 1 G IV SOLR
INTRAVENOUS | Status: DC | PRN
  Administered 2024-06-18: 1000 mg via TOPICAL

## 2024-06-18 MED ORDER — MIDAZOLAM HCL 2 MG/2ML IJ SOLN: 1.0000 mg | INTRAMUSCULAR | Status: DC

## 2024-06-18 MED ORDER — SODIUM CHLORIDE 0.9 % IR SOLN
Status: DC | PRN
  Administered 2024-06-18: 1000 mL

## 2024-06-18 MED ORDER — PROPOFOL 10 MG/ML IV BOLUS
INTRAVENOUS | Status: AC
  Filled 2024-06-18: qty 20

## 2024-06-18 MED ORDER — DEXAMETHASONE SODIUM PHOSPHATE 10 MG/ML IJ SOLN
INTRAMUSCULAR | Status: AC
Start: 2024-06-18 — End: 2024-06-18
  Filled 2024-06-18: qty 1

## 2024-06-18 MED ORDER — ACETAMINOPHEN 500 MG PO TABS: 1000.0000 mg | ORAL_TABLET | Freq: Three times a day (TID) | ORAL | 0 refills | Status: AC

## 2024-06-18 MED ORDER — MUPIROCIN 2 % EX OINT: 1.0000 | TOPICAL_OINTMENT | Freq: Every day | CUTANEOUS | 0 refills | Status: AC

## 2024-06-18 MED ORDER — TRANEXAMIC ACID-NACL 1000-0.7 MG/100ML-% IV SOLN
1000.0000 mg | INTRAVENOUS | Status: AC
  Administered 2024-06-18: 1000 mg via INTRAVENOUS
  Filled 2024-06-18: qty 100

## 2024-06-18 MED ORDER — ACETAMINOPHEN 500 MG PO TABS
1000.0000 mg | ORAL_TABLET | Freq: Once | ORAL | Status: AC
  Administered 2024-06-18: 1000 mg via ORAL
  Filled 2024-06-18: qty 2

## 2024-06-18 MED ORDER — 0.9 % SODIUM CHLORIDE (POUR BTL) OPTIME
TOPICAL | Status: DC | PRN
  Administered 2024-06-18: 1000 mL

## 2024-06-18 MED ORDER — STERILE WATER FOR IRRIGATION IR SOLN
Status: DC | PRN
  Administered 2024-06-18: 2000 mL

## 2024-06-18 MED ORDER — OXYCODONE HCL 5 MG PO TABS: ORAL_TABLET | ORAL | 0 refills | Status: AC

## 2024-06-18 MED ORDER — ONDANSETRON HCL 4 MG/2ML IJ SOLN
INTRAMUSCULAR | Status: DC | PRN
  Administered 2024-06-18: 4 mg via INTRAVENOUS

## 2024-06-18 MED ORDER — PHENYLEPHRINE HCL-NACL 20-0.9 MG/250ML-% IV SOLN
INTRAVENOUS | Status: DC | PRN
  Administered 2024-06-18: 25 ug/min via INTRAVENOUS

## 2024-06-18 MED ORDER — ACETAMINOPHEN 10 MG/ML IV SOLN: 1000.0000 mg | Freq: Once | INTRAVENOUS | Status: DC | PRN

## 2024-06-18 MED ORDER — FENTANYL CITRATE PF 50 MCG/ML IJ SOSY
50.0000 ug | PREFILLED_SYRINGE | INTRAMUSCULAR | Status: DC
  Filled 2024-06-18: qty 1

## 2024-06-18 MED ORDER — ONDANSETRON HCL 4 MG PO TABS: 4.0000 mg | ORAL_TABLET | Freq: Three times a day (TID) | ORAL | 0 refills | Status: AC | PRN

## 2024-06-18 MED ORDER — FENTANYL CITRATE (PF) 100 MCG/2ML IJ SOLN
INTRAMUSCULAR | Status: DC | PRN
  Administered 2024-06-18 (×2): 50 ug via INTRAVENOUS

## 2024-06-18 MED ORDER — VANCOMYCIN HCL 1000 MG IV SOLR
INTRAVENOUS | Status: AC
  Filled 2024-06-18: qty 20

## 2024-06-18 MED ORDER — ONDANSETRON HCL 4 MG/2ML IJ SOLN: 4.0000 mg | Freq: Once | INTRAMUSCULAR | Status: DC | PRN

## 2024-06-18 MED ORDER — ONDANSETRON HCL 4 MG/2ML IJ SOLN
INTRAMUSCULAR | Status: AC
  Filled 2024-06-18: qty 2

## 2024-06-18 MED ORDER — OXYCODONE HCL 5 MG PO TABS: 5.0000 mg | ORAL_TABLET | Freq: Once | ORAL | Status: DC | PRN

## 2024-06-18 MED ORDER — GLYCOPYRROLATE 0.2 MG/ML IJ SOLN
INTRAMUSCULAR | Status: DC | PRN
  Administered 2024-06-18: .2 mg via INTRAVENOUS

## 2024-06-18 MED ORDER — LACTATED RINGERS IV SOLN
INTRAVENOUS | Status: DC
Start: 2024-06-18 — End: 2024-06-18

## 2024-06-18 MED ORDER — DEXAMETHASONE SODIUM PHOSPHATE 10 MG/ML IJ SOLN
INTRAMUSCULAR | Status: DC | PRN
  Administered 2024-06-18: 10 mg via INTRAVENOUS

## 2024-06-18 MED ORDER — BUPIVACAINE HCL (PF) 0.5 % IJ SOLN
INTRAMUSCULAR | Status: DC | PRN
  Administered 2024-06-18: 10 mL

## 2024-06-18 MED ORDER — SUGAMMADEX SODIUM 200 MG/2ML IV SOLN
INTRAVENOUS | Status: DC | PRN
  Administered 2024-06-18: 150 mg via INTRAVENOUS

## 2024-06-18 MED ORDER — FENTANYL CITRATE (PF) 100 MCG/2ML IJ SOLN
INTRAMUSCULAR | Status: AC
  Filled 2024-06-18: qty 2

## 2024-06-18 MED ORDER — FENTANYL CITRATE PF 50 MCG/ML IJ SOSY: 25.0000 ug | PREFILLED_SYRINGE | INTRAMUSCULAR | Status: DC | PRN

## 2024-06-18 MED ORDER — ROCURONIUM BROMIDE 10 MG/ML (PF) SYRINGE
PREFILLED_SYRINGE | INTRAVENOUS | Status: DC | PRN
  Administered 2024-06-18: 50 mg via INTRAVENOUS

## 2024-06-18 MED ORDER — OXYCODONE HCL 5 MG/5ML PO SOLN: 5.0000 mg | Freq: Once | ORAL | Status: DC | PRN

## 2024-06-18 MED ORDER — LIDOCAINE 2% (20 MG/ML) 5 ML SYRINGE
INTRAMUSCULAR | Status: DC | PRN
  Administered 2024-06-18: 60 mg via INTRAVENOUS

## 2024-06-18 MED ORDER — PROPOFOL 10 MG/ML IV BOLUS
INTRAVENOUS | Status: DC | PRN
  Administered 2024-06-18: 130 mg via INTRAVENOUS

## 2024-06-18 SURGICAL SUPPLY — 53 items
AUGMENT BASEPLATE 15DEG 25 WDG (Joint) IMPLANT
BAG COUNTER SPONGE SURGICOUNT (BAG) ×1 IMPLANT
BIT DRILL 3.2 PERIPHERAL SCREW (BIT) IMPLANT
BLADE SAW SGTL 73X25 THK (BLADE) ×1 IMPLANT
CHLORAPREP W/TINT 26 (MISCELLANEOUS) ×2 IMPLANT
CLSR STERI-STRIP ANTIMIC 1/2X4 (GAUZE/BANDAGES/DRESSINGS) ×1 IMPLANT
COOLER ICEMAN CLASSIC (MISCELLANEOUS) ×1 IMPLANT
COVER BACK TABLE 60X90IN (DRAPES) ×1 IMPLANT
COVER SURGICAL LIGHT HANDLE (MISCELLANEOUS) ×1 IMPLANT
CUP HUM REV INSERT SZ3/4 36 (Orthopedic Implant) IMPLANT
DRAPE C-ARM 42X120 X-RAY (DRAPES) IMPLANT
DRAPE INCISE IOBAN 66X45 STRL (DRAPES) ×1 IMPLANT
DRAPE POUCH INSTRU U-SHP 10X18 (DRAPES) ×1 IMPLANT
DRAPE SHEET LG 3/4 BI-LAMINATE (DRAPES) ×2 IMPLANT
DRAPE SURG ORHT 6 SPLT 77X108 (DRAPES) ×2 IMPLANT
DRESSING AQUACEL AG SP 3.5X6 (GAUZE/BANDAGES/DRESSINGS) IMPLANT
DRSG AQUACEL AG ADV 3.5X 6 (GAUZE/BANDAGES/DRESSINGS) ×1 IMPLANT
ELECT BLADE TIP CTD 4 INCH (ELECTRODE) ×1 IMPLANT
ELECT PENCIL ROCKER SW 15FT (MISCELLANEOUS) ×1 IMPLANT
ELECT REM PT RETURN 15FT ADLT (MISCELLANEOUS) ×1 IMPLANT
FACESHIELD WRAPAROUND OR TEAM (MASK) ×3 IMPLANT
GLENOSPHERE REV SHOULDER 36 (Joint) IMPLANT
GLOVE BIO SURGEON STRL SZ 6.5 (GLOVE) ×2 IMPLANT
GLOVE BIOGEL PI IND STRL 6.5 (GLOVE) ×1 IMPLANT
GLOVE BIOGEL PI IND STRL 8 (GLOVE) ×1 IMPLANT
GLOVE ECLIPSE 8.0 STRL XLNG CF (GLOVE) ×2 IMPLANT
GOWN STRL REUS W/ TWL LRG LVL3 (GOWN DISPOSABLE) ×2 IMPLANT
GUIDEWIRE GLENOID 2.5X220 (WIRE) IMPLANT
KIT BASIN OR (CUSTOM PROCEDURE TRAY) ×1 IMPLANT
KIT STABILIZATION SHOULDER (MISCELLANEOUS) ×1 IMPLANT
KIT TURNOVER KIT A (KITS) ×1 IMPLANT
MANIFOLD NEPTUNE II (INSTRUMENTS) ×1 IMPLANT
NS IRRIG 1000ML POUR BTL (IV SOLUTION) ×1 IMPLANT
PACK SHOULDER (CUSTOM PROCEDURE TRAY) ×1 IMPLANT
PAD COLD SHLDR WRAP-ON (PAD) ×1 IMPLANT
PIN GUIDE 3X75 SHOULDER (PIN) IMPLANT
RESTRAINT HEAD UNIVERSAL NS (MISCELLANEOUS) ×1 IMPLANT
SCREW 5.5X22 (Screw) IMPLANT
SCREW 5.5X26 (Screw) IMPLANT
SCREW BONE THREAD 6.5X35 (Screw) IMPLANT
SET HNDPC FAN SPRY TIP SCT (DISPOSABLE) ×1 IMPLANT
SPONGE T-LAP 4X18 ~~LOC~~+RFID (SPONGE) IMPLANT
STEM HUMERAL STD SHORT SZ3 (Joint) IMPLANT
SUCTION TUBE FRAZIER 12FR DISP (SUCTIONS) IMPLANT
SUT ETHIBOND 2 V 37 (SUTURE) ×1 IMPLANT
SUT ETHIBOND NAB CT1 #1 30IN (SUTURE) ×1 IMPLANT
SUT MNCRL AB 4-0 PS2 18 (SUTURE) ×1 IMPLANT
SUT VIC AB 0 CT1 36 (SUTURE) IMPLANT
SUT VIC AB 3-0 SH 27X BRD (SUTURE) ×1 IMPLANT
SUTURE FIBERWR #5 38 CONV NDL (SUTURE) ×2 IMPLANT
TOWEL OR 17X26 10 PK STRL BLUE (TOWEL DISPOSABLE) ×1 IMPLANT
TUBE SUCTION HIGH CAP CLEAR NV (SUCTIONS) ×1 IMPLANT
WATER STERILE IRR 1000ML POUR (IV SOLUTION) ×2 IMPLANT

## 2024-06-18 NOTE — Op Note (Signed)
 Orthopaedic Surgery Operative Note (CSN: [id number])  Robin [name]  12/16/1940 Date of Surgery: 06/18/2024   Diagnoses:  Left rotator cuff arthropathy  Procedure: Left reverse augmented total Shoulder Arthroplasty   Operative Finding Successful completion of planned procedure.  Patient had significant cuff tear arthropathy with soft bone in the humerus particularly.  We had good fixation with our glenoid side implants.  Tug test was normal.  Post-operative plan: The patient will be NWB in sling.  The patient will be will be discharged from PACU if continues to be stable as was plan prior to surgery.  DVT prophylaxis Aspirin 81 mg twice daily for 6 weeks.  Pain control with PRN pain medication preferring oral medicines.  Follow up plan will be scheduled in approximately 7 days for incision check and XR.  Physical therapy to start after a month.  Implants: Tornier perform humeral size 3 standard stem, 3 retentive polyethylene, 36 standard glenosphere with a 25 full wedge baseplate, 35 mm center screw with 4 peripheral screws  Post-Op Diagnosis: Same Surgeons:Primary: Cristy Bonner DASEN, MD Assistants:Kirstin Shepperson, PA-C Location: [facility] ROOM 09 Anesthesia: General with Exparel  Interscalene Antibiotics: Ancef  2g preop, Vancomycin  1000mg  locally Tourniquet time: None Estimated Blood Loss: 100 Complications: None Specimens: None Implants: Implant Name Type Inv. Item Serial No. Manufacturer Lot No. LRB No. Used Action  GLENOSPHERE REV SHOULDER 36 - [id number] Joint GLENOSPHERE REV SHOULDER 36 [id number] [employer]  Left 1 Implanted  AUGMENT BASEPLATE 15DEG 25 WDG - [id number] Joint AUGMENT BASEPLATE 15DEG 25 WDG [id number] [employer]  Left 1 Implanted  SCREW BONE THREAD 6.5X35 - [id number] Screw SCREW BONE THREAD 6.5X35  [employer]  Left 1 Implanted  SCREW 5.5X26 - [id number] Screw SCREW 5.5X26  [employer]  Left 1 Implanted  SCREW 5.5X22 - [id number] Screw SCREW 5.5X22  [employer]  Left  3 Implanted  STEM HUMERAL STD SHORT SZ3 - [id number] Joint STEM HUMERAL STD SHORT SZ3 [id number] [employer]  Left 1 Implanted  CUP HUM REV INSERT SZ3/4 36 - [id number] Orthopedic Implant CUP HUM REV INSERT SZ3/4 36 [id number] [employer]  Left 1 Implanted    Indications for Surgery:   Robin [name] is a 83 y.o. female with end-stage cuff tear arthropathy.  Benefits and risks of operative and nonoperative management were discussed prior to surgery with patient/guardian(s) and informed consent form was completed.  Infection and need for further surgery were discussed as was prosthetic stability and cuff issues.  We additionally specifically discussed risks of axillary nerve injury, infection, periprosthetic fracture, continued pain and longevity of implants prior to beginning procedure.      Procedure:   The patient was identified in the preoperative holding area where the surgical site was marked. Block placed by anesthesia with exparel .  The patient was taken to the OR where a procedural timeout was called and the above noted anesthesia was induced.  The patient was positioned beachchair on allen table with spider arm positioner.  Preoperative antibiotics were dosed.  The patient's left shoulder was prepped and draped in the usual sterile fashion.  A second preoperative timeout was called.       Standard deltopectoral approach was performed with a #10 blade. We dissected down to the subcutaneous tissues and the cephalic vein was taken laterally with the deltoid. Clavipectoral fascia was incised in line with the incision. Deep retractors were placed. The long of the biceps tendon was identified and there was significant tenosynovitis present.  Tenodesis was performed to the  pectoralis tendon with #2 Ethibond. The remaining biceps was followed up into the rotator interval where it was released.   The subscapularis was taken down in a full thickness layer with capsule along the humeral neck extending  inferiorly around the humeral head. We continued releasing the capsule directly off of the osteophytes inferiorly all the way around the corner. This allowed us  to dislocate the humeral head.   There were osteophytes along the inferior humeral neck. The osteophytes were removed with an osteotome and a rongeur.  Osteophytes were removed with a rongeur and an osteotome and the anatomic neck was well visualized.     A humeral cutting guide was used extra medullary with a pin to help control version. The version was set at 20 of retroversion. Humeral osteotomy was performed with an oscillating saw. The head fragment was passed off the back table.  A cut protector plate was placed.  The subscapularis was again identified and immediately we took care to palpate the axillary nerve anteriorly and verify its position with gentle palpation as well as the tug test.  We then released the SGHL with bovie cautery prior to placing a curved mayo at the junction of the anterior glenoid well above the axillary nerve and bluntly dissecting the subscapularis from the capsule.  We then carefully protected the axillary nerve as we gently released the inferior capsule to fully mobilize the subscapularis.  An anterior deltoid retractor was then placed as well as a small Hohmann retractor superiorly.   The glenoid was inspected and had evidence of severe osteoarthritic wear with full-thickness cartilage loss and exposed subchondral bone.   The remaining labrum was removed circumferentially taking great care not to disrupt the posterior capsule.   At this point we felt based on blueprint templating that a full wedge augment was necessary.  We began by using a full wedge guide to place our center pin as was templated.  We had good position of this pin and we proceeded with our starter center drill.  This allowed for us  to use the 15 degree full wedge reamer obtaining circumferential witness marks and good bone preparation for  ingrowth.  At this point we proceeded with our center drill and had an intact vault.  We then drilled our center screw to a length of 35 mm.    We selected a 6.5 mm x 35 mm screw and the full wedge baseplate which was placed in the same orientation as our reaming.  We double checked that we had good apposition of the base plate to bone and then proceeded to place 3 locking screws and one nonlocking screw as is typical.   Next a 36 mm glenosphere was selected and impacted onto the baseplate. The center screw was tightened.  We turned attention back to the humeral side. The cut protector was removed.  We used the perform humeral sizing block to select the appropriate size which for this patient was a 2.  We then placed our center pin and reamed over it concentrically obtaining appropriate inset.  We then used our lateralizing chisel to prepare the lateral aspect of the humerus.  At that point we selected the appropriate implant trialing a 3.  Using this trial implant we trialed multiple polyethylene sizes settling on a 3 which provided good stability and range of motion without excess soft tissue tension. The offset was dialed in to match the normal anatomy. The shoulder was trialed.  There was good ROM in all planes  and the shoulder was stable with no inferior translation.  The real humeral implants were opened after again confirming sizes.  The trial was removed. #5 Fiberwire x4 sutures passed through the humeral neck for subscap repair. The humeral component was press-fit obtaining a secure fit. The joint was reduced and thoroughly irrigated with pulsatile lavage. Subscap was repaired back with #5 Fiberwire sutures through bone tunnels. Hemostasis was obtained. The deltopectoral interval was reapproximated with #1 Ethibond. The subcutaneous tissues were closed with 2-0 Vicryl and the skin was closed with running monocryl.    The wounds were cleaned and dried and an Aquacel dressing was placed. The drapes  taken down. The arm was placed into sling with abduction pillow. Patient was awakened, extubated, and transferred to the recovery room in stable condition. There were no intraoperative complications. The sponge, needle, and attention counts were  correct at the end of the case.     Aleck Stalling, PA-C, present and scrubbed throughout the case, critical for completion in a timely fashion, and for retraction, instrumentation, closure.

## 2024-06-18 NOTE — Transfer of Care (Signed)
 Immediate Anesthesia Transfer of Care Note  Patient: Robin [name]  Procedure(s) Performed: ARTHROPLASTY, SHOULDER, TOTAL, REVERSE (Left: Shoulder)  Patient Location: PACU  Anesthesia Type:General and Regional  Level of Consciousness: awake, alert , and oriented  Airway & Oxygen Therapy: Patient Spontanous Breathing and Patient connected to face mask oxygen  Post-op Assessment: Report given to RN and Post -op Vital signs reviewed and stable  Post vital signs: Reviewed and stable  Last Vitals:  Vitals Value Taken Time  BP 164/62 06/18/24 11:46  Temp    Pulse 58 06/18/24 11:47  Resp 13 06/18/24 11:47  SpO2 100 % 06/18/24 11:47  Vitals shown include unfiled device data.  Last Pain:  Vitals:   06/18/24 0824  TempSrc: Oral  PainSc:          Complications: No notable events documented.

## 2024-06-18 NOTE — Discharge Instructions (Addendum)
 Bonner Hair MD, MPH Aleck Stalling, PA-C [facility] [address], [address] [phone] (tel)   [phone] (fax)   POST-OPERATIVE INSTRUCTIONS - TOTAL SHOULDER REPLACEMENT    WOUND CARE You may leave the operative dressing in place until your follow-up appointment. KEEP THE INCISIONS CLEAN AND DRY. There may be a small amount of fluid/bleeding leaking at the surgical site. This is normal after surgery.  If it fills with liquid or blood please call us  immediately to change it for you. Use the provided ice machine or Ice packs as often as possible for the first 3-4 days, then as needed for pain relief.   Keep a layer of cloth or a shirt between your skin and the cooling unit to prevent frost bite as it can get very cold.  SHOWERING: - You may shower on Post-Op Day #2.  - The dressing is water  resistant but do not scrub it as it may start to peel up.   - You may remove the sling for showering - Gently pat the area dry.  - Do not soak the shoulder in water .  - Do not go swimming in the pool or ocean until your incision has completely healed (about 4-6 weeks after surgery) - KEEP THE INCISIONS CLEAN AND DRY.  EXERCISES Wear the sling at all times  You may remove the sling for showering, but keep the arm across the chest or in a secondary sling.    Accidental/Purposeful External Rotation and shoulder flexion (reaching behind you) is to be avoided at all costs for the first month. It is ok to come out of your sling if your are sitting and have assistance for eating.   Do not lift anything heavier than 1 pound until we discuss it further in clinic.  It is normal for your fingers/hand to become more swollen after surgery and discolored from bruising.   This will resolve over the first few weeks usually after surgery. Please continue to ambulate and do not stay sitting or lying for too long.  Perform foot and wrist pumps to assist in circulation.  PHYSICAL  THERAPY - No therapy for 4 weeks   REGIONAL ANESTHESIA (NERVE BLOCKS) The anesthesia team may have performed a nerve block for you this is a great tool used to minimize pain.   The block may start wearing off overnight (between 8-24 hours postop) When the block wears off, your pain may go from nearly zero to the pain you would have had postop without the block. This is an abrupt transition but nothing dangerous is happening.   This can be a challenging period but utilize your as needed pain medications to try and manage this period. We suggest you use the pain medication the first night prior to going to bed, to ease this transition.  You may take an extra dose of narcotic when this happens if needed   POST-OP MEDICATIONS- Multimodal approach to pain control In general your pain will be controlled with a combination of substances.  Prescriptions unless otherwise discussed are electronically sent to your pharmacy.  This is a carefully made plan we use to minimize narcotic use.     Acetaminophen  - Non-narcotic pain medicine taken on a scheduled basis  Oxycodone  - This is a strong narcotic, to be used only on an "as needed" basis for SEVERE pain. Zofran  -  take as needed for nausea   FOLLOW-UP If you develop a Fever (>101.5), Redness or Drainage from the surgical incision  site, please call our office to arrange for an evaluation. Please call the office to schedule a follow-up appointment for a wound check, 7-10 days post-operatively.  IF YOU HAVE ANY QUESTIONS, PLEASE FEEL FREE TO CALL OUR OFFICE.  HELPFUL INFORMATION  Your arm will be in a sling following surgery. You will be in this sling for the next 4 weeks.   You may be more comfortable sleeping in a semi-seated position the first few nights following surgery.  Keep a pillow propped under the elbow and forearm for comfort.  If you have a recliner type of chair it might be beneficial.  If not that is fine too, but it would be helpful  to sleep propped up with pillows behind your operated shoulder as well under your elbow and forearm.  This will reduce pulling on the suture lines.  When dressing, put your operative arm in the sleeve first.  When getting undressed, take your operative arm out last.  Loose fitting, button-down shirts are recommended.  In most states it is against the law to drive while your arm is in a sling. And certainly against the law to drive while taking narcotics.  You may return to work/school in the next couple of days when you feel up to it. Desk work and typing in the sling is fine.  We suggest you use the pain medication the first night prior to going to bed, in order to ease any pain when the anesthesia wears off. You should avoid taking pain medications on an empty stomach as it will make you nauseous.  You should wean off your narcotic medicines as soon as you are able.     Most patients will be off narcotics before their first postop appointment.   Do not drink alcoholic beverages or take illicit drugs when taking pain medications.  Pain medication may make you constipated.  Below are a few solutions to try in this order: Decrease the amount of pain medication if you aren't having pain. Drink lots of decaffeinated fluids. Drink prune juice and/or each dried prunes  If the first 3 don't work start with additional solutions Take Colace - an over-the-counter stool softener Take Senokot - an over-the-counter laxative Take Miralax - a stronger over-the-counter laxative   Dental Antibiotics:  We require dental prophylaxis for 2 years after a shoulder replacement  Contact your surgeon for an antibiotic prescription, prior to your dental procedure.   For more information including helpful videos and documents visit our website:   [email or web address]

## 2024-06-18 NOTE — Anesthesia Preprocedure Evaluation (Addendum)
 Anesthesia Evaluation  Patient identified by MRN, date of birth, ID band Patient awake    Reviewed: Allergy & Precautions, NPO status , Patient's Chart, lab work & pertinent test results, reviewed documented beta blocker date and time   History of Anesthesia Complications (+) history of anesthetic complications  Airway Mallampati: II  TM Distance: >3 FB     Dental no notable dental hx.    Pulmonary sleep apnea , neg COPD   breath sounds clear to auscultation       Cardiovascular hypertension, (-) Past MI, (-) Cardiac Stents and (-) CABG  Rhythm:Regular Rate:Normal     Neuro/Psych  Headaches PSYCHIATRIC DISORDERS Anxiety Depression     Neuromuscular disease    GI/Hepatic ,GERD  ,,(+) Hepatitis -  Endo/Other  diabetes, Type 2Hypothyroidism    Renal/GU CRFRenal disease     Musculoskeletal  (+) Arthritis , Osteoarthritis,    Abdominal   Peds  Hematology  (+) Blood dyscrasia, anemia   Anesthesia Other Findings   Reproductive/Obstetrics                              Anesthesia Physical Anesthesia Plan  ASA: 3  Anesthesia Plan: General   Post-op Pain Management: Regional block*   Induction: Intravenous  PONV Risk Score and Plan: 2 and Ondansetron   Airway Management Planned: Oral ETT  Additional Equipment:   Intra-op Plan:   Post-operative Plan:   Informed Consent: I have reviewed the patients History and Physical, chart, labs and discussed the procedure including the risks, benefits and alternatives for the proposed anesthesia with the patient or authorized representative who has indicated his/her understanding and acceptance.     Dental advisory given  Plan Discussed with: CRNA  Anesthesia Plan Comments:          Anesthesia Quick Evaluation

## 2024-06-18 NOTE — Anesthesia Procedure Notes (Signed)
 Anesthesia Regional Block: Interscalene brachial plexus block   Pre-Anesthetic Checklist: , timeout performed,  Correct Patient, Correct Site, Correct Laterality,  Correct Procedure, Correct Position, site marked,  Risks and benefits discussed,  Surgical consent,  Pre-op evaluation,  At surgeon's request and post-op pain management  Laterality: Left  Prep: Maximum Sterile Barrier Precautions used, chloraprep       Needles:  Injection technique: Single-shot  Needle Type: Echogenic Needle      Needle Gauge: 20     Additional Needles:   Procedures:,,,, ultrasound used (permanent image in chart),,    Narrative:  Start time: 06/18/2024 9:08 AM End time: 06/18/2024 9:13 AM Injection made incrementally with aspirations every 5 mL.  Performed by: Personally  Anesthesiologist: Keneth Lynwood POUR, MD

## 2024-06-18 NOTE — Anesthesia Procedure Notes (Signed)
 Procedure Name: Intubation Date/Time: 06/18/2024 10:14 AM  Performed by: Mayes Sangiovanni D, CRNAPre-anesthesia Checklist: Patient identified, Emergency Drugs available, Suction available and Patient being monitored Patient Re-evaluated:Patient Re-evaluated prior to induction Oxygen Delivery Method: Circle system utilized Preoxygenation: Pre-oxygenation with 100% oxygen Induction Type: IV induction Ventilation: Mask ventilation without difficulty Laryngoscope Size: Mac and 3 Grade View: Grade I Tube type: Oral Tube size: 7.0 mm Number of attempts: 1 Airway Equipment and Method: Stylet and Oral airway Placement Confirmation: ETT inserted through vocal cords under direct vision, positive ETCO2 and breath sounds checked- equal and bilateral Secured at: 21 cm Tube secured with: Tape Dental Injury: Teeth and Oropharynx as per pre-operative assessment

## 2024-06-19 ENCOUNTER — Encounter (HOSPITAL_COMMUNITY): Payer: Self-pay | Admitting: Orthopaedic Surgery

## 2024-06-19 NOTE — Anesthesia Postprocedure Evaluation (Signed)
 Anesthesia Post Note  Patient: Robin [name]  Procedure(s) Performed: ARTHROPLASTY, SHOULDER, TOTAL, REVERSE (Left: Shoulder)     Patient location during evaluation: PACU Anesthesia Type: General Level of consciousness: awake and alert Pain management: pain level controlled Vital Signs Assessment: post-procedure vital signs reviewed and stable Respiratory status: spontaneous breathing, nonlabored ventilation, respiratory function stable and patient connected to nasal cannula oxygen Cardiovascular status: blood pressure returned to baseline and stable Postop Assessment: no apparent nausea or vomiting Anesthetic complications: no   No notable events documented.  Last Vitals:  Vitals:   06/18/24 1230 06/18/24 1245  BP: (!) 145/68 (!) 149/56  Pulse: (!) 52 (!) 57  Resp: 14   Temp:    SpO2: 96% 99%    Last Pain:  Vitals:   06/18/24 1230  TempSrc:   PainSc: 0-No pain                 Lynwood MARLA Cornea

## 2024-06-24 DIAGNOSIS — M19012 Primary osteoarthritis, left shoulder: Secondary | ICD-10-CM | POA: Diagnosis not present

## 2024-07-15 DIAGNOSIS — Z96612 Presence of left artificial shoulder joint: Secondary | ICD-10-CM | POA: Diagnosis not present

## 2024-07-17 DIAGNOSIS — Z96612 Presence of left artificial shoulder joint: Secondary | ICD-10-CM | POA: Diagnosis not present

## 2024-07-24 DIAGNOSIS — Z96612 Presence of left artificial shoulder joint: Secondary | ICD-10-CM | POA: Diagnosis not present

## 2024-07-29 DIAGNOSIS — H00022 Hordeolum internum right lower eyelid: Secondary | ICD-10-CM | POA: Diagnosis not present

## 2024-07-29 LAB — HM DIABETES EYE EXAM

## 2024-08-07 DIAGNOSIS — Z96612 Presence of left artificial shoulder joint: Secondary | ICD-10-CM | POA: Diagnosis not present

## 2024-08-11 DIAGNOSIS — F3342 Major depressive disorder, recurrent, in full remission: Secondary | ICD-10-CM | POA: Diagnosis not present

## 2024-08-11 DIAGNOSIS — M810 Age-related osteoporosis without current pathological fracture: Secondary | ICD-10-CM | POA: Diagnosis not present

## 2024-08-11 DIAGNOSIS — R5383 Other fatigue: Secondary | ICD-10-CM | POA: Diagnosis not present

## 2024-08-11 DIAGNOSIS — E039 Hypothyroidism, unspecified: Secondary | ICD-10-CM | POA: Diagnosis not present

## 2024-08-11 DIAGNOSIS — I129 Hypertensive chronic kidney disease with stage 1 through stage 4 chronic kidney disease, or unspecified chronic kidney disease: Secondary | ICD-10-CM | POA: Diagnosis not present

## 2024-08-11 DIAGNOSIS — Z6827 Body mass index (BMI) 27.0-27.9, adult: Secondary | ICD-10-CM | POA: Diagnosis not present

## 2024-08-11 DIAGNOSIS — N1832 Chronic kidney disease, stage 3b: Secondary | ICD-10-CM | POA: Diagnosis not present

## 2024-08-11 DIAGNOSIS — E782 Mixed hyperlipidemia: Secondary | ICD-10-CM | POA: Diagnosis not present

## 2024-08-13 DIAGNOSIS — H00022 Hordeolum internum right lower eyelid: Secondary | ICD-10-CM | POA: Diagnosis not present

## 2024-08-13 LAB — OPHTHALMOLOGY REPORT-SCANNED

## 2024-08-16 DIAGNOSIS — T148XXA Other injury of unspecified body region, initial encounter: Secondary | ICD-10-CM | POA: Diagnosis not present

## 2024-08-16 DIAGNOSIS — M75122 Complete rotator cuff tear or rupture of left shoulder, not specified as traumatic: Secondary | ICD-10-CM | POA: Diagnosis not present

## 2024-08-16 DIAGNOSIS — Z96612 Presence of left artificial shoulder joint: Secondary | ICD-10-CM | POA: Diagnosis not present

## 2024-08-16 DIAGNOSIS — M25512 Pain in left shoulder: Secondary | ICD-10-CM | POA: Diagnosis not present

## 2024-08-16 DIAGNOSIS — R29818 Other symptoms and signs involving the nervous system: Secondary | ICD-10-CM | POA: Diagnosis not present

## 2024-08-16 DIAGNOSIS — M19012 Primary osteoarthritis, left shoulder: Secondary | ICD-10-CM | POA: Diagnosis not present

## 2024-09-02 ENCOUNTER — Other Ambulatory Visit: Payer: Self-pay | Admitting: Physician Assistant

## 2024-09-02 DIAGNOSIS — G8929 Other chronic pain: Secondary | ICD-10-CM

## 2024-09-03 ENCOUNTER — Inpatient Hospital Stay: Admission: RE | Admit: 2024-09-03 | Discharge: 2024-09-03 | Attending: Physician Assistant

## 2024-09-03 DIAGNOSIS — S42122A Displaced fracture of acromial process, left shoulder, initial encounter for closed fracture: Secondary | ICD-10-CM | POA: Diagnosis not present

## 2024-09-03 DIAGNOSIS — G8929 Other chronic pain: Secondary | ICD-10-CM

## 2024-09-11 ENCOUNTER — Other Ambulatory Visit

## 2024-09-11 ENCOUNTER — Ambulatory Visit: Admitting: Internal Medicine

## 2024-09-11 ENCOUNTER — Encounter: Payer: Self-pay | Admitting: Internal Medicine

## 2024-09-11 VITALS — BP 132/70 | HR 57 | Ht 62.0 in | Wt 152.6 lb

## 2024-09-11 DIAGNOSIS — E1121 Type 2 diabetes mellitus with diabetic nephropathy: Secondary | ICD-10-CM

## 2024-09-11 DIAGNOSIS — Z7984 Long term (current) use of oral hypoglycemic drugs: Secondary | ICD-10-CM

## 2024-09-11 DIAGNOSIS — E785 Hyperlipidemia, unspecified: Secondary | ICD-10-CM

## 2024-09-11 DIAGNOSIS — E1169 Type 2 diabetes mellitus with other specified complication: Secondary | ICD-10-CM | POA: Diagnosis not present

## 2024-09-11 DIAGNOSIS — E063 Autoimmune thyroiditis: Secondary | ICD-10-CM

## 2024-09-11 LAB — POCT GLYCOSYLATED HEMOGLOBIN (HGB A1C): Hemoglobin A1C: 7.1 % — AB (ref 4.0–5.6)

## 2024-09-11 NOTE — Patient Instructions (Addendum)
 Please continue: - Glipizide  ER 2.5 mg before b'fast - Jardiance  10 mg before b'fast  Check sugars 1x a day, rotating check times and write them down.  Continue Levothyroxine 112 mcg daily.  Take the thyroid  hormone every day, with water , at least 30 minutes before breakfast, separated by at least 4 hours from: - acid reflux medications - calcium - iron  - multivitamins  Please stop at the lab.  Please return in 3-4 months with your sugar log.

## 2024-09-11 NOTE — Progress Notes (Signed)
 Patient ID: Robin [name], female   DOB: 1941-09-12, 83 y.o.   MRN: [id number]  HPI: Robin [name] is a 84 y.o.-year-old female, returning for f/u for DM2, initially GDM, then DM2 dx in 05/2015, non-insulin -dependent, controlled, with complications (mild CKD, diabetic retinopathy). Last visit 6 months ago.  Interim history: No increased urination, blurry vision, nausea.  Since last visit she had left shoulder replacement surgery 06/18/2024. She had 2 weeks of steroids and a steroid inj for her shoulder 2-3 weeks ago. She still has pain.  She has a bone density pending by PCP.  Reviewed HbA1c levels: Lab Results  Component Value Date   HGBA1C 7.0 (A) 05/07/2024   HGBA1C 7.1 (A) 01/24/2024   HGBA1C 6.6 (A) 11/08/2023  07/20/2020: HbA1c 6.6%  Pt is on a regimen of: - Metformin  ER 1000 mg with dinner -she had diarrhea with higher doses >> 500 mg twice a day >> 1000 mg in am and 500 mg with dinner >> off 2/2 CKD - Glipizide  ER 2.5 mg before b'fast- added 03/2024 - Jardiance  10 mg before breakfast-added 10/2023 She did not start Januvia  - $.  Pt checks her sugars 1-2x a day: - am: 139 >> 121-145 >> 105 >> n/c >> 136, 149 - 2h after b'fast: n/c >> 74 >> 126 >> 203 >> n/c  - before lunch: 74, 81-155, 187, 193 >> 81-120 >> 120-133 - 2h after lunch:  79, 86 >> 145 >> 115 >> n/c - before dinner: 80 >> 91-140, 161 >> n/c >> 99 >> n/c - 2h after dinner: 120-130 >> n/c >> 110 >> 121 >> n/c - bedtime: 115, 116 >> 94  >> n/c >> 139 >> n/c - nighttime: n/c Lowest sugar was 58 >> ... 74 >> 41 >> 76 x1; she has hypoglycemia awareness in the 70s. Highest sugar was  193 >> 193 >> 200?[name]  Glucometer: One Touch verio  Pt's meals are: - Breakfast: cheerios + raisin bran or cornflakes + 2% milk or bacon + egg + toast - Lunch: salad or soup or sandwich - Dinner: veggies + starch - Snacks: no desserts; apple, chips She saw a dietitian 07/09/2015-this helped a lot. In 2019 had a pruritic maculopapular  generalized rash. She saw PCP and dermatology and no etiology was found. She then saw Dr. Timmy - suspicion for mastocytosis  - BM Bx negative.  She found out she is allergic to meat.  She stopped eating meat and the rash resolved.  Of note, no alpha gal allergy.  She was also investigated for gallstones.  + Mild CKD - now sees nephrology, last BUN/creatinine:  Lab Results  Component Value Date   BUN 27 (H) 05/30/2024   CREATININE 1.59 (H) 05/30/2024   Lab Results  Component Value Date   MICRALBCREAT 161 (H) 11/08/2023   Microalbumin Panel Reviewed date:11/27/2021 09:24:17 AM Interpretation:Abnormal Performing Lab: Notes/Report: Testing Performed at: [facility], [address], [place], [place] [address]  MA/CR ratio 39.1 0.0-30.0 mg/G    UMA 1.37 0.00-1.90 mg/dL    UCR 35        + HL; last set of lipids: 08/11/2024: 199/283/44/106 10/09/2023: 182/166/54.4/94 11/23/2022: 201/311/45/104 Lab Results  Component Value Date   CHOL 210 (H) 02/12/2020   HDL 44.60 02/12/2020   LDLCALC 131 (H) 11/01/2015   LDLDIRECT 134.0 02/12/2020   TRIG 253.0 (H) 02/12/2020   CHOLHDL 5 02/12/2020  She is on Zetia 10 mg daily and fish oil 1000 mg twice a  day. She tried statins >> Bone pain.  She tried Benecol spread but did not like it.  She also developed a rash from fenofibrate. I suggested a referral to nutrition but she did not have this appointment.   - last eye exam was on 12/12/2023: No DR, but has a history of + DR. She does have drusen and hypertensive retinopathy along with glaucoma.  - No numbness and tingling in her feet.  Last foot exam 05/07/2024 here in clinic.  She also has HTN, OSA, depression, osteoarthritis, spinal stenosis, osteopenia, GERD, CKD stage III. + h/o colon cancer.  Hashimoto's hypothyroidism:  Pt is on levothyroxine 112 mcg daily, taken: - in am - fasting - at least 30 min from b'fast - + calcium at noon - no iron  - + multivitamins  - 1h after  LT4 >> moved 4 hours after LT4 - started PPIs (Omeprazole) - 1st thing in am! >>  moved later in the day >> now off - not on Biotin  Reviewed latest TSH levels: 08/11/2024: TSH 0.23 11/26/2023: TSH 0.68 11/23/2022: TSH 0.64 11/21/2021: TSH 0.57 (0.34-4.5) Lab Results  Component Value Date   TSH 0.57 08/24/2021   TSH 0.84 02/12/2020   TSH 2.140 03/22/2018  07/20/2020: TSH 2.22 10/22/2018: TSH 0.76  In 2019 she was found to have elevated ATA antibodies: Lab Results  Component Value Date   THGAB 93.8 (H) 03/22/2018   We checked a thyroid  ultrasound since she has a prominent left thyroid  lobe on palpation: Thyroid  U/S (03/03/2019): no nodules, heterogeneity and atrophy  Before our visit from 04/2024, she had a fall in the shower and had fractures at the R superior and inferior pubic rami.    ROS: + see HPI  I reviewed pt's medications, allergies, PMH, social hx, family hx, and changes were documented in the history of present illness. Otherwise, unchanged from my initial visit note.  Past Medical History:  Diagnosis Date   Anxiety    Arthritis    Chronic kidney disease    Colon cancer (HCC)    colon ca dx 2008 and 2010   Complication of anesthesia    problems waking up-patient thinks she was given too much medication   Depression    GERD (gastroesophageal reflux disease)    Gestational diabetes    Headache    Hepatitis    ? Hepatitis at age15   Hyperlipidemia    Hypertension    Hypothyroidism    Sleep apnea    uses CPAP   Urticaria    Past Surgical History:  Procedure Laterality Date   ABDOMINAL HYSTERECTOMY     APPENDECTOMY     BACK SURGERY  2009   CARPAL TUNNEL RELEASE Right    COLON SURGERY     COLONOSCOPY WITH PROPOFOL  N/A 08/10/2015   Procedure: COLONOSCOPY WITH PROPOFOL ;  Surgeon: Robin MARLA Louder, MD;  Location: [facility];  Service: Endoscopy;  Laterality: N/A;   DILATION AND CURETTAGE OF UTERUS     had before had hysterectomy    ESOPHAGOGASTRODUODENOSCOPY (EGD) WITH PROPOFOL  N/A 08/10/2015   Procedure: ESOPHAGOGASTRODUODENOSCOPY (EGD) WITH PROPOFOL ;  Surgeon: Robin MARLA Louder, MD;  Location: [facility];  Service: Endoscopy;  Laterality: N/A;   EYE SURGERY     bilateral cataract surgery    REVERSE SHOULDER ARTHROPLASTY Left 06/18/2024   Procedure: ARTHROPLASTY, SHOULDER, TOTAL, REVERSE;  Surgeon: Cristy Bonner DASEN, MD;  Location: [facility];  Service: Orthopedics;  Laterality: Left;   ROTATOR CUFF REPAIR Right 1990   Social  History   Socioeconomic History   Marital status: Divorced    Spouse name: Not on file   Number of children: 4   Years of education: 12+   Highest education level: Not on file  Occupational History   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   Caffeine use: Drinks coffee and tea (1 cup coffee per day). Tea rarely    Social Drivers of Health   Tobacco Use: Low Risk (06/18/2024)   Patient History    Smoking Tobacco Use: Never    Smokeless Tobacco Use: Never    Passive Exposure: Not on file  Financial Resource Strain: Not on file  Food Insecurity: Not on file  Transportation Needs: Not on file  Physical Activity: Not on file  Stress: Not on file  Social Connections: Not on file  Intimate Partner Violence: Not on file  Depression (PHQ2-9): Low Risk (05/30/2024)   Depression (PHQ2-9)    PHQ-2 Score: 0  Alcohol Screen: Not on file  Housing: Not on file  Utilities: Not on file  Health Literacy: Not on file   Current Outpatient Medications on File Prior to Visit  Medication Sig  Dispense Refill   amLODipine (NORVASC) 10 MG tablet Take 10 mg by mouth in the morning.     aspirin 81 MG tablet Take 81 mg by mouth at bedtime.     beta carotene w/minerals (OCUVITE) tablet Take 1 tablet by mouth daily in the afternoon.     budesonide (ENTOCORT EC) 3 MG 24 hr capsule Take 3 mg by mouth daily as needed (stomach issues.).     Cholecalciferol (VITAMIN D -3 PO) Take 1 tablet by mouth at bedtime.     Cyanocobalamin (VITAMIN B-12 PO) Take 1 tablet by mouth at bedtime.     empagliflozin  (JARDIANCE ) 10 MG TABS tablet Take 1 tablet (10 mg total) by mouth daily before breakfast. 90 tablet 3   EPINEPHrine 0.3 mg/0.3 mL IJ SOAJ injection See admin instructions. (Patient not taking: Reported on 06/12/2024)     ezetimibe (ZETIA) 10 MG tablet Take 10 mg by mouth daily after breakfast.     fexofenadine  (ALLEGRA ) 180 MG tablet Take 180 mg by mouth daily as needed for allergies.     FLUoxetine (PROZAC) 20 MG capsule Take 20 mg by mouth daily after breakfast.     fluticasone (FLONASE) 50 MCG/ACT nasal spray Place 1 spray into both nostrils daily as needed for allergies.     furosemide (LASIX) 20 MG tablet Take 20 mg by mouth in the morning.     glipiZIDE  (GLUCOTROL  XL) 2.5 MG 24 hr tablet Take 1 tablet by mouth once daily with breakfast 30 tablet 11   Glucosamine HCl 1500 MG TABS Take 1,500 mg by mouth daily in the afternoon.     glucose blood (ONETOUCH VERIO) test strip USE TO CHECK YOU BLOOD SUGAR ONCE OR TWICE DAILY 200 each 3   L-LYSINE PO Take 1 capsule by mouth daily as needed (fever blister).  levothyroxine (SYNTHROID, LEVOTHROID) 112 MCG tablet Take 112 mcg by mouth daily before breakfast.      losartan  (COZAAR ) 100 MG tablet Take 100 mg by mouth daily after breakfast.     MAGNESIUM PO Take 1 capsule by mouth at bedtime.     Multiple Vitamin (MULTIVITAMIN) capsule Take 1 capsule by mouth daily in the afternoon. Complete Multivitamin for Women     mupirocin  ointment (BACTROBAN ) 2 % Apply  1 Application topically daily. As instructed 22 g 0   Omega-3 Fatty Acids (FISH OIL PO) Take 1 capsule by mouth daily in the afternoon.     omeprazole (PRILOSEC) 40 MG capsule Take 40 mg by mouth daily as needed (acid reflux/indigestion.).     polyvinyl alcohol (LIQUIFILM TEARS) 1.4 % ophthalmic solution Place 1 drop into both eyes as needed for dry eyes.     Probiotic Product (PROBIOTIC DAILY) CAPS Take 1 capsule by mouth at bedtime.     spironolactone (ALDACTONE) 25 MG tablet Take 12.5 mg by mouth daily.     No current facility-administered medications on file prior to visit.   Allergies  Allergen Reactions   Atorvastatin Other (See Comments)    Bones aching   Celecoxib Other (See Comments)    unknown   Rosuvastatin Other (See Comments)    Bones aching   Ace Inhibitors Cough   Statins     Aches in her bone crestor and lipitor   Family History  Problem Relation Age of Onset   Heart attack Mother 46   Heart disease Sister    Heart attack Brother 41   Heart attack Brother 3   Aneurysm Brother 61   Breast cancer Granddaughter 32   Migraines Neg Hx    PE: There were no vitals taken for this visit.  Wt Readings from Last 3 Encounters:  06/18/24 143 lb 4.8 oz (65 kg)  06/12/24 145 lb (65.8 kg)  05/30/24 146 lb 1.9 oz (66.3 kg)   Constitutional: normal weight, in NAD Eyes: EOMI, no exophthalmos ENT:  + slight left thyroid  fullness, no cervical lymphadenopathy Cardiovascular: RRR, No MRG Respiratory: CTA B Musculoskeletal: + Heberden and Bouchard finger deformities Skin: no rashes Neurological: no tremor with outstretched hands  ASSESSMENT: 1. DM2, non-insulin -dependent, controlled, with long-term complications -Mild CKD -Diabetic retinopathy  2. HL  3.  Hypothyroidism due to Hashimoto's thyroiditis  PLAN:  1. Patient with history of controlled type 2 diabetes, previously on oral antidiabetic regimen with metformin , to which we added a SGLT2 inhibitor.  However,  afterwards, due to the decreased kidney function, we had to switch from metformin  to a sulfonylurea.  She started this 1 months prior to her last visit and sugars improved.  At last visit, HbA1c was decreased to 7.0%.  Sugars were excellent, all within target range so we continued the same regimen.  She is on the lowest dose of glipizide  available.  I did advise her to let me know if she develops low blood sugars. - At today's visit, her sugars are slightly above target in the morning, but controlled when she checks before lunch.  She is not checking in the evening and we discussed about trying to do so.  Otherwise, I do not recommend to change her regimen for now. - I suggested to:  Patient Instructions  Please continue: - Glipizide  ER 2.5 mg before b'fast - Jardiance  10 mg before b'fast  Check sugars 1x a day, rotating check times and write them down.  Continue Levothyroxine 112  mcg daily.  Take the thyroid  hormone every day, with water , at least 30 minutes before breakfast, separated by at least 4 hours from: - acid reflux medications - calcium - iron  - multivitamins  Please stop at the lab.  Please return in 3-4 months with your sugar log.   - we checked her HbA1c: 7.1% (slightly higher) - advised to check sugars at different times of the day - 1x a day, rotating check times - advised for yearly eye exams >> she is UTD - return to clinic in 3-4 months  2. HL - Reviewed latest lipid panel was reviewed from 07/2024: Triglycerides elevated, at 283 and LDL also above target at 106 - Continues Zetia 10 mg daily and fish oil 1000 mg twice a day without side effects.  She could not tolerate statins and had a rash with fenofibrate.   3.  Hashimoto's hypothyroidism -In the past, her left thyroid  lobe appeared more prominent on palpation so we checked a thyroid  ultrasound.  This was negative for nodules. - latest thyroid  labs reviewed with pt. >> normal in 11/2023, however, TSH obtained on  08/11/2022 was low per review of outside records: 0.23.  I am wondering if this result could have been influenced by her steroid use.  She is not quite sure when she got prednisone  and a steroid injection within the last month - she continues on LT4 112 mcg daily - pt feels good on this dose. - we discussed about taking the thyroid  hormone every day, with water , >30 minutes before breakfast, separated by >4 hours from acid reflux medications, calcium, iron , multivitamins. Pt. is taking it correctly. - will check thyroid  tests and adjust the dose of LT4 accordingly - We will recheck her TFTs today  Needs refills LT4.  Orders Placed This Encounter  Procedures   TSH   T4, free   Lela Fendt, MD PhD [facility] Endocrinology

## 2024-09-11 NOTE — Addendum Note (Signed)
 Addended by: CLEOTILDE ROLIN RAMAN on: 09/11/2024 10:28 AM   Modules accepted: Orders

## 2024-09-12 ENCOUNTER — Ambulatory Visit: Payer: Self-pay | Admitting: Internal Medicine

## 2024-09-12 LAB — TSH: TSH: 0.51 m[IU]/L (ref 0.40–4.50)

## 2024-09-12 LAB — T4, FREE: Free T4: 1.7 ng/dL (ref 0.8–1.8)

## 2024-09-12 MED ORDER — LEVOTHYROXINE SODIUM 100 MCG PO TABS: 100.0000 ug | ORAL_TABLET | Freq: Every day | ORAL | 3 refills | Status: AC

## 2024-09-12 NOTE — Addendum Note (Signed)
 Addended by: TRIXIE FILE on: 09/12/2024 11:19 AM   Modules accepted: Orders

## 2024-11-28 ENCOUNTER — Ambulatory Visit: Admitting: Family

## 2024-11-28 ENCOUNTER — Other Ambulatory Visit

## 2025-01-15 ENCOUNTER — Ambulatory Visit: Admitting: Internal Medicine
